# Patient Record
Sex: Female | Born: 1940 | Race: White | Hispanic: No | Marital: Married | Smoking: Never smoker
Health system: Southern US, Community
[De-identification: ages and names within clinical notes are randomized; demographics above are authoritative.]

## PROBLEM LIST (undated history)

## (undated) DIAGNOSIS — I10 Essential (primary) hypertension: Secondary | ICD-10-CM

## (undated) DIAGNOSIS — C801 Malignant (primary) neoplasm, unspecified: Secondary | ICD-10-CM

## (undated) DIAGNOSIS — Z8616 Personal history of COVID-19: Secondary | ICD-10-CM

## (undated) DIAGNOSIS — C4491 Basal cell carcinoma of skin, unspecified: Secondary | ICD-10-CM

## (undated) HISTORY — PX: BREAST CYST ASPIRATION: SHX578

## (undated) HISTORY — DX: Personal history of COVID-19: Z86.16

## (undated) HISTORY — DX: Basal cell carcinoma of skin, unspecified: C44.91

## (undated) HISTORY — PX: ABDOMINAL HYSTERECTOMY: SHX81

## (undated) HISTORY — PX: BREAST BIOPSY: SHX20

## (undated) HISTORY — DX: Essential (primary) hypertension: I10

---

## 2004-09-10 ENCOUNTER — Ambulatory Visit: Payer: Self-pay | Admitting: Obstetrics and Gynecology

## 2005-09-15 ENCOUNTER — Ambulatory Visit: Payer: Self-pay | Admitting: Obstetrics and Gynecology

## 2006-09-22 ENCOUNTER — Ambulatory Visit: Payer: Self-pay | Admitting: Obstetrics and Gynecology

## 2007-01-26 ENCOUNTER — Ambulatory Visit: Payer: Self-pay | Admitting: Gastroenterology

## 2007-09-28 ENCOUNTER — Ambulatory Visit: Payer: Self-pay | Admitting: Obstetrics and Gynecology

## 2007-10-02 ENCOUNTER — Ambulatory Visit: Payer: Self-pay | Admitting: Obstetrics and Gynecology

## 2008-10-01 ENCOUNTER — Ambulatory Visit: Payer: Self-pay | Admitting: Obstetrics and Gynecology

## 2008-10-08 ENCOUNTER — Ambulatory Visit: Payer: Self-pay | Admitting: Obstetrics and Gynecology

## 2009-04-21 ENCOUNTER — Ambulatory Visit: Payer: Self-pay | Admitting: Obstetrics and Gynecology

## 2009-10-02 ENCOUNTER — Ambulatory Visit: Payer: Self-pay | Admitting: Obstetrics and Gynecology

## 2010-06-29 ENCOUNTER — Ambulatory Visit: Payer: Self-pay | Admitting: Unknown Physician Specialty

## 2010-09-16 ENCOUNTER — Ambulatory Visit: Payer: Self-pay | Admitting: Unknown Physician Specialty

## 2010-11-03 ENCOUNTER — Ambulatory Visit: Payer: Self-pay | Admitting: Obstetrics and Gynecology

## 2011-03-11 ENCOUNTER — Ambulatory Visit: Payer: Self-pay | Admitting: Family Medicine

## 2011-12-16 ENCOUNTER — Ambulatory Visit: Payer: Self-pay | Admitting: Obstetrics and Gynecology

## 2012-07-05 ENCOUNTER — Ambulatory Visit: Payer: Self-pay | Admitting: Gastroenterology

## 2012-12-19 ENCOUNTER — Ambulatory Visit: Payer: Self-pay | Admitting: Obstetrics and Gynecology

## 2013-12-20 ENCOUNTER — Ambulatory Visit: Payer: Self-pay | Admitting: Obstetrics and Gynecology

## 2014-07-30 DIAGNOSIS — M797 Fibromyalgia: Secondary | ICD-10-CM | POA: Insufficient documentation

## 2014-07-30 DIAGNOSIS — M1990 Unspecified osteoarthritis, unspecified site: Secondary | ICD-10-CM | POA: Insufficient documentation

## 2014-07-30 DIAGNOSIS — I10 Essential (primary) hypertension: Secondary | ICD-10-CM | POA: Insufficient documentation

## 2014-07-30 DIAGNOSIS — G43909 Migraine, unspecified, not intractable, without status migrainosus: Secondary | ICD-10-CM | POA: Insufficient documentation

## 2014-07-30 DIAGNOSIS — K580 Irritable bowel syndrome with diarrhea: Secondary | ICD-10-CM | POA: Insufficient documentation

## 2014-07-30 DIAGNOSIS — M8580 Other specified disorders of bone density and structure, unspecified site: Secondary | ICD-10-CM | POA: Insufficient documentation

## 2014-12-09 ENCOUNTER — Ambulatory Visit: Payer: Self-pay | Admitting: Family Medicine

## 2014-12-24 ENCOUNTER — Ambulatory Visit: Payer: Self-pay | Admitting: Obstetrics and Gynecology

## 2015-08-18 DIAGNOSIS — E786 Lipoprotein deficiency: Secondary | ICD-10-CM | POA: Insufficient documentation

## 2015-10-16 IMAGING — MG MM DIGITAL SCREENING BILAT W/ CAD
1 series · 4 of 4 positions shown · non-contrast
Comparison: Previous exam(s).

CLINICAL DATA: Screening.

EXAM:
DIGITAL SCREENING BILATERAL MAMMOGRAM WITH CAD

[R CC · right · 4 of 4 slices shown]
[im 1/4]
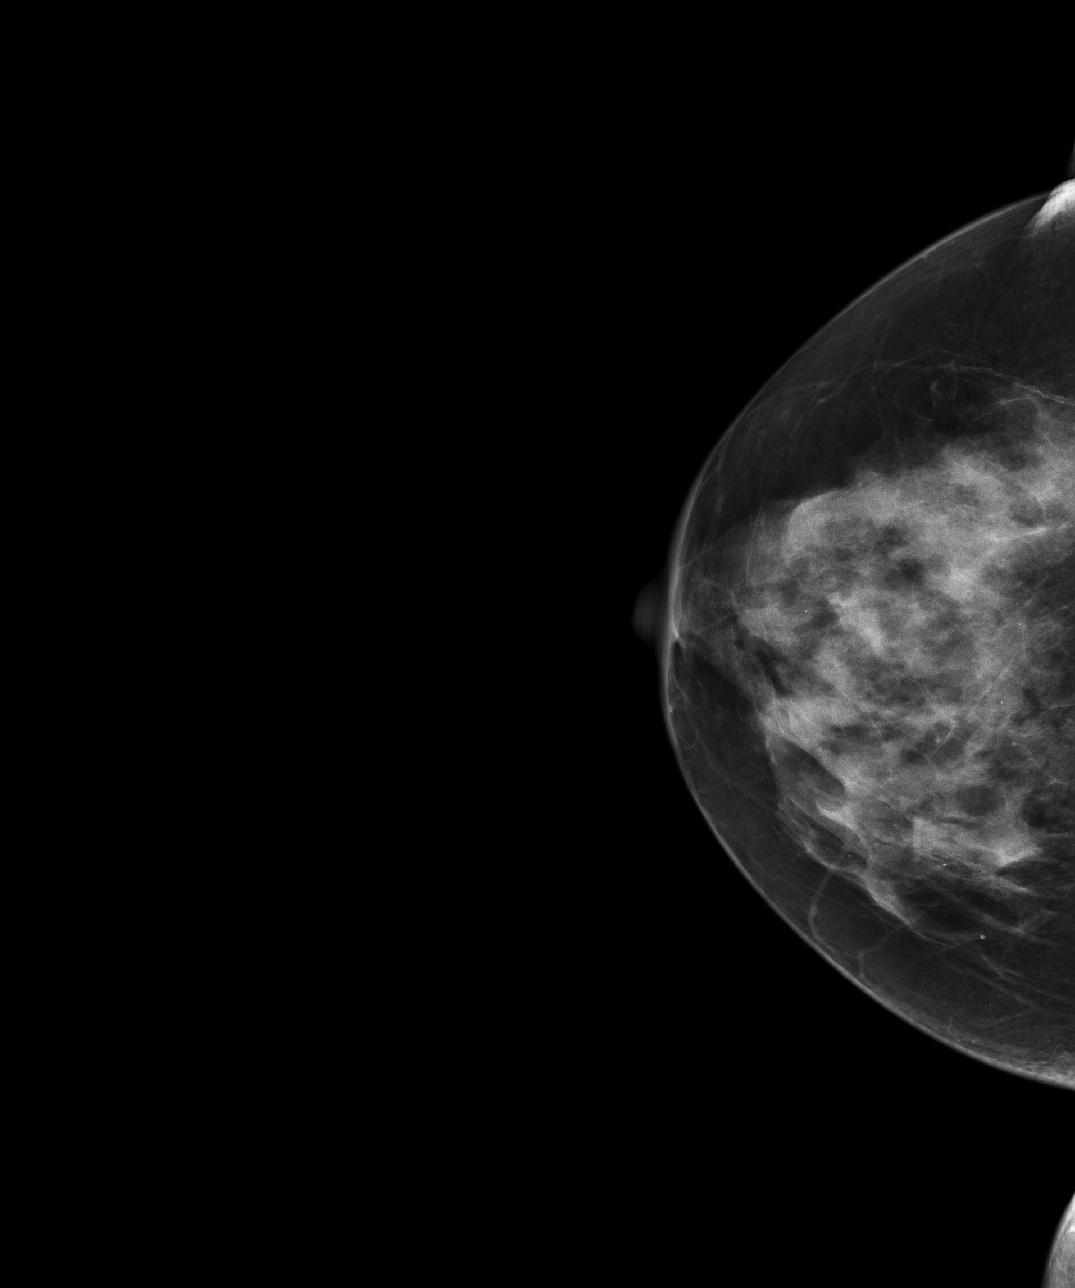
[im 2/4]
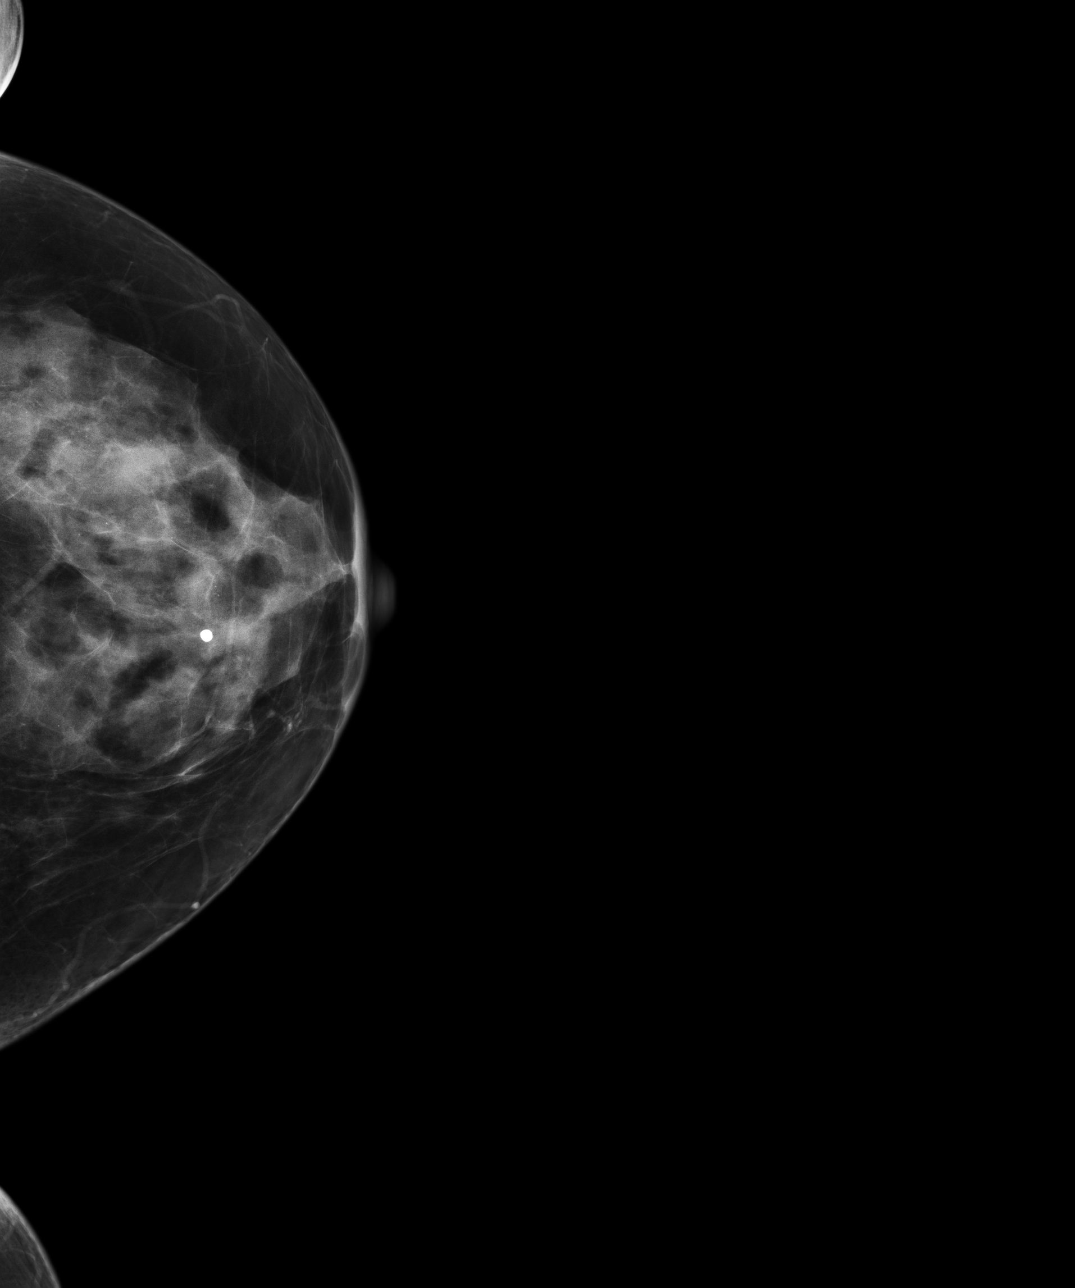
[im 3/4]
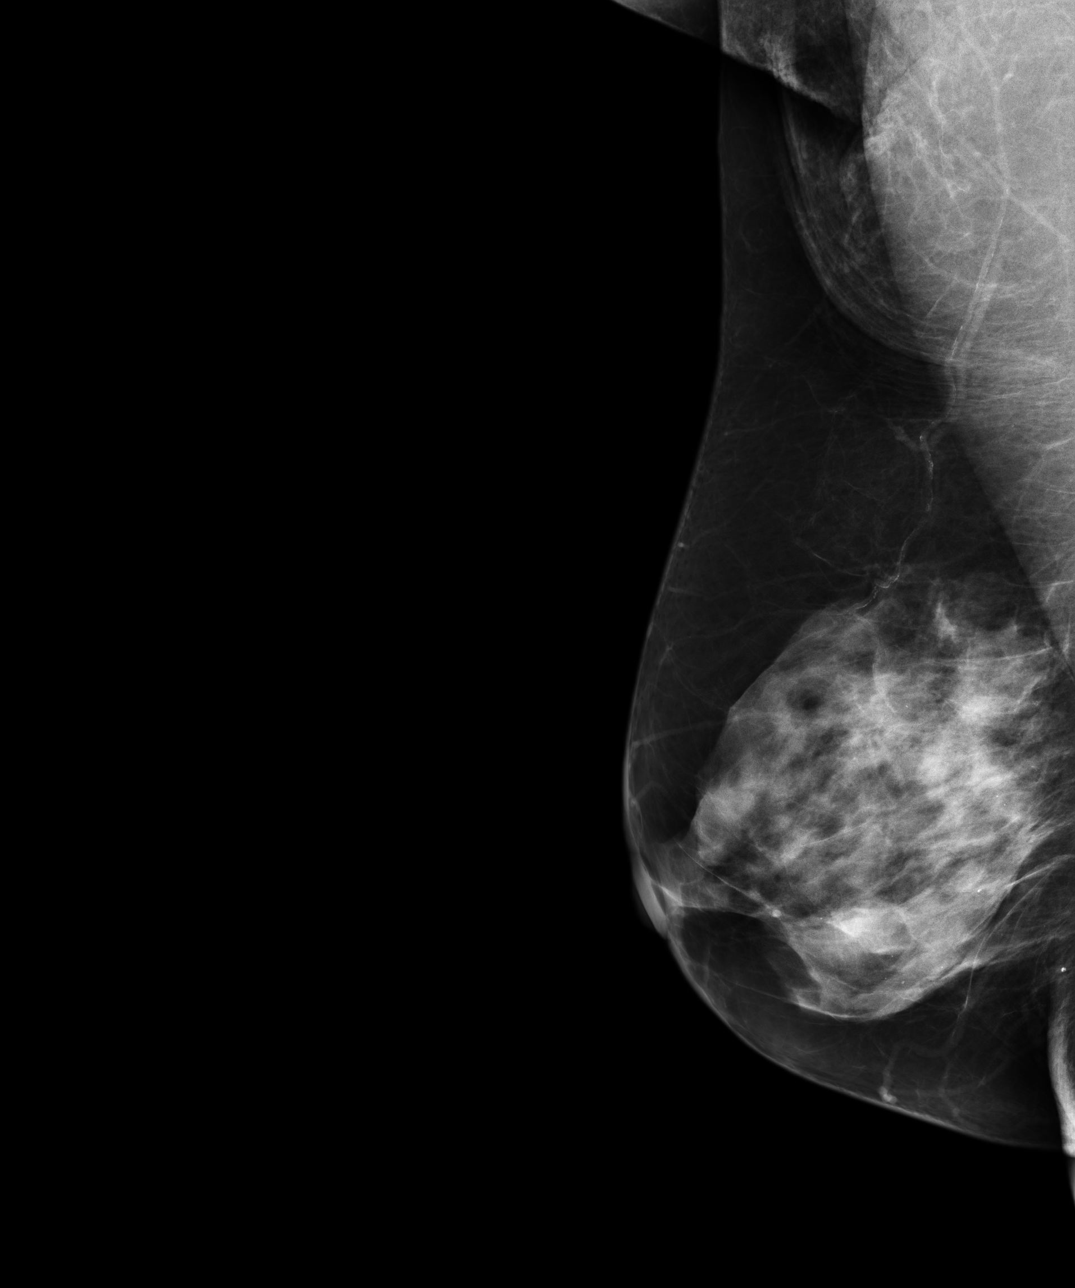
[im 4/4]
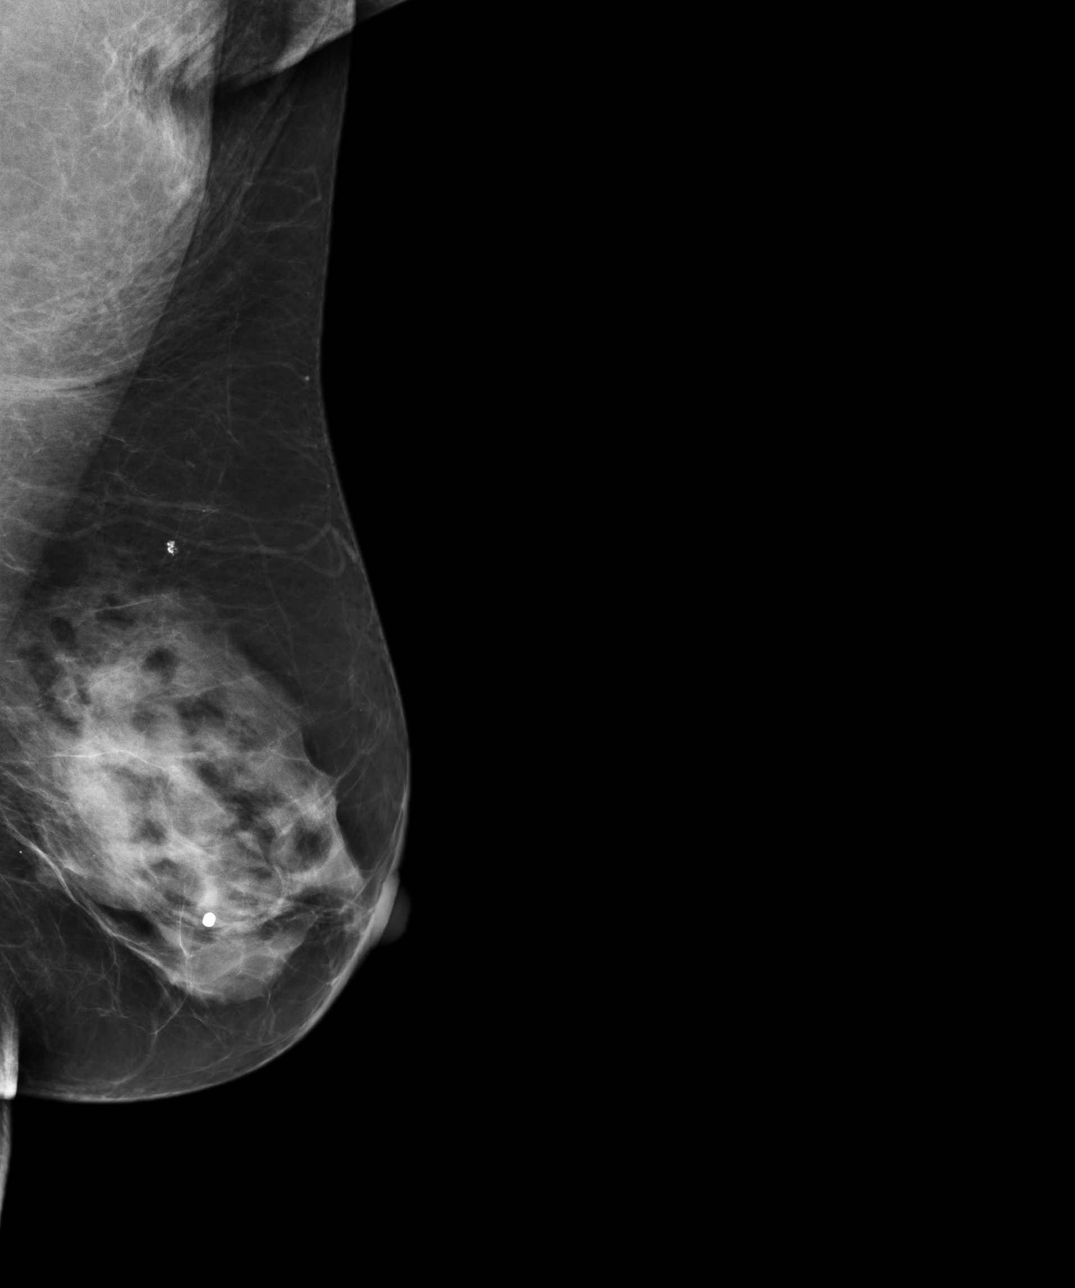

[4 of 4 positions shown; findings below may reference images not displayed]

ACR Breast Density Category c: The breast tissue is heterogeneously
dense, which may obscure small masses.
FINDINGS: There are no findings suspicious for malignancy. Images were
processed with CAD.
IMPRESSION: No mammographic evidence of malignancy. A result letter of this
screening mammogram will be mailed directly to the patient.

RECOMMENDATION:
Screening mammogram in one year. (Code:YJ-[id number])

BI-RADS CATEGORY  1: Negative.

## 2015-12-16 ENCOUNTER — Other Ambulatory Visit: Payer: Self-pay | Admitting: Obstetrics and Gynecology

## 2015-12-16 DIAGNOSIS — Z1231 Encounter for screening mammogram for malignant neoplasm of breast: Secondary | ICD-10-CM

## 2015-12-30 ENCOUNTER — Ambulatory Visit
Admission: RE | Admit: 2015-12-30 | Discharge: 2015-12-30 | Disposition: A | Discharge status: Home / Self Care | Payer: Medicare Other | Source: Ambulatory Visit | Attending: Obstetrics and Gynecology | Admitting: Obstetrics and Gynecology

## 2015-12-30 DIAGNOSIS — Z1231 Encounter for screening mammogram for malignant neoplasm of breast: Secondary | ICD-10-CM | POA: Diagnosis not present

## 2016-02-17 DIAGNOSIS — A419 Sepsis, unspecified organism: Secondary | ICD-10-CM | POA: Insufficient documentation

## 2016-02-19 DIAGNOSIS — N10 Acute tubulo-interstitial nephritis: Secondary | ICD-10-CM | POA: Insufficient documentation

## 2016-11-26 ENCOUNTER — Other Ambulatory Visit: Payer: Self-pay | Admitting: Obstetrics and Gynecology

## 2016-11-26 DIAGNOSIS — Z1231 Encounter for screening mammogram for malignant neoplasm of breast: Secondary | ICD-10-CM

## 2016-12-30 ENCOUNTER — Ambulatory Visit
Admission: RE | Admit: 2016-12-30 | Discharge: 2016-12-30 | Disposition: A | Discharge status: Home / Self Care | Payer: Medicare Other | Source: Ambulatory Visit | Attending: Obstetrics and Gynecology | Admitting: Obstetrics and Gynecology

## 2016-12-30 DIAGNOSIS — Z1231 Encounter for screening mammogram for malignant neoplasm of breast: Secondary | ICD-10-CM | POA: Diagnosis not present

## 2016-12-30 HISTORY — DX: Malignant (primary) neoplasm, unspecified: C80.1

## 2017-01-12 ENCOUNTER — Encounter: Payer: Self-pay | Admitting: Urology

## 2017-01-12 ENCOUNTER — Ambulatory Visit: Payer: Medicare Other | Admitting: Urology

## 2017-01-12 VITALS — BP 145/73 | HR 64 | Ht 64.0 in | Wt 148.1 lb

## 2017-01-12 DIAGNOSIS — N390 Urinary tract infection, site not specified: Secondary | ICD-10-CM | POA: Diagnosis not present

## 2017-01-12 DIAGNOSIS — N3020 Other chronic cystitis without hematuria: Secondary | ICD-10-CM

## 2017-01-12 DIAGNOSIS — R319 Hematuria, unspecified: Secondary | ICD-10-CM | POA: Diagnosis not present

## 2017-01-12 LAB — URINALYSIS, COMPLETE
BILIRUBIN UA: NEGATIVE
GLUCOSE, UA: NEGATIVE
KETONES UA: NEGATIVE
NITRITE UA: NEGATIVE
Protein, UA: NEGATIVE
RBC UA: NEGATIVE
SPEC GRAV UA: 1.015 (ref 1.005–1.030)
Urobilinogen, Ur: 0.2 mg/dL (ref 0.2–1.0)
pH, UA: 6 (ref 5.0–7.5)

## 2017-01-12 LAB — MICROSCOPIC EXAMINATION: Bacteria, UA: NONE SEEN

## 2017-01-12 LAB — BLADDER SCAN AMB NON-IMAGING: Scan Result: 8

## 2017-01-12 MED ORDER — TRIMETHOPRIM 100 MG PO TABS: 100.0000 mg | ORAL_TABLET | Freq: Every day | ORAL | 11 refills | Status: DC

## 2017-01-12 NOTE — Progress Notes (Signed)
01/12/2017 11:01 AM   Anne Ross 1941/02/10 [id number]  Referring provider: Sherrin Daisy, MD [place], [place] [id number]  Chief Complaint  Patient presents with  . New Patient (Initial Visit)    recurrent UTI w/ hematuria     HPI: I was consulted to assess the patient who had 2 urinary tract infections in the last year. Last March she was admitted to the hospital in [facility] with a temperature of 104.7 with right low back pain and foul-smelling urine. He was the intensive care unit. During the second bladder infection she was treated as an outpatient the did have a low grade temperature  At baseline she voids every 2-3 hours and is continent and has no nocturia. She does not normally get urinary tract infections. She has had a hysterectomy but not previous GU surgery or kidney stones.Modifying factors:   Associated signs and symptoms: There are no other associated signs and symptoms Aggravating and relieving factors: There are no other aggravating or relieving factors Severity: Moderate Duration: Persistent        PMH: Past Medical History:  Diagnosis Date  . Cancer ([place])    uterine  . Hypertension     Surgical History: Past Surgical History:  Procedure Laterality Date  . ABDOMINAL HYSTERECTOMY    . BREAST BIOPSY Left    benign 2 times  . BREAST BIOPSY Right    neg  . BREAST CYST ASPIRATION Left    neg    Home Medications:  Allergies as of 01/12/2017      Reactions   Cortisone Hives   Other Hives      Medication List       Accurate as of 01/12/17 11:01 AM. Always use your most recent med list.          CALCIUM 500 + D 500-125 MG-UNIT Tabs Generic drug:  Calcium Carbonate-Vitamin D Take by mouth.   cetirizine 10 MG tablet Commonly known as:  ZYRTEC Take 10 mg by mouth daily.   FIBER CHOICE PO Take by mouth.   fluticasone 50 MCG/ACT nasal spray Commonly known as:  FLONASE Place into both nostrils daily.   folic  acid [id number] MCG tablet Commonly known as:  FOLVITE Take 400 mcg by mouth daily.   metoprolol 50 MG tablet Commonly known as:  LOPRESSOR Take 50 mg by mouth 2 (two) times daily.   pyridoxine 100 MG tablet Commonly known as:  B-6 Take 100 mg by mouth daily.   RAMIPRIL PO Take by mouth.   vitamin B-12 250 MCG tablet Commonly known as:  CYANOCOBALAMIN Take 250 mcg by mouth daily.   Vitamin D 2000 units tablet Take 2,000 Units by mouth daily.       Allergies:  Allergies  Allergen Reactions  . Cortisone Hives  . Other Hives    Family History: Family History  Problem Relation Age of Onset  . Prostate cancer Maternal Grandfather   . Breast cancer Neg Hx     Social History:  has no tobacco, alcohol, and drug history on file.  ROS: UROLOGY Frequent Urination?: No Hard to postpone urination?: No Burning/pain with urination?: No Get up at night to urinate?: No Leakage of urine?: No Urine stream starts and stops?: Yes Trouble starting stream?: No Do you have to strain to urinate?: No Blood in urine?: No Urinary tract infection?: No Sexually transmitted disease?: No Injury to kidneys or bladder?: No Painful intercourse?: No Weak stream?: No Currently pregnant?: No Vaginal bleeding?:  No Last menstrual period?: n                                      Physical Exam: BP (!) 145/73   Pulse 64   Ht 5\' 4"  (1.626 m)   Wt 67.2 kg (148 lb 1.6 oz)   BMI 25.42 kg/m   Constitutional:  Alert and oriented, No acute distress. HEENT: [place] AT, moist mucus membranes.  Trachea midline, no masses. Cardiovascular: No clubbing, cyanosis, or edema. Respiratory: Normal respiratory effort, no increased work of breathing. GI: Abdomen is soft, nontender, nondistended, no abdominal masses GU: Wild hypermobility of the bladder neck and no stress incontinence; grade 1 cystocele Skin: No rashes, bruises or suspicious lesions. Lymph: No cervical or inguinal  adenopathy. Neurologic: Grossly intact, no focal deficits, moving all 4 extremities. Psychiatric: Normal mood and affect.  Laboratory Data:  Urinalysis No results found for: COLORURINE, APPEARANCEUR, LABSPEC, PHURINE, GLUCOSEU, HGBUR, BILIRUBINUR, KETONESUR, PROTEINUR, UROBILINOGEN, NITRITE, LEUKOCYTESUR  Pertinent Imaging: No new xray  Assessment & Plan:  The patient describes right lower quadrant or right low back pain and fever with urinary tract infections. The wire she has minimal voiding dysfunction. On review of the medical records she had a normal CT scan of the kidneys in March 2017  The pathophysiology of urinary tract infections was discussed. The pathophysiology of pyelonephritis and the role of prophylaxis was discussed.  Based upon the frequency of the events I can understand why the patient would not take daily prophylaxis but because of their severity she chose trimethoprim 100 mg with 30 tablets of 11 refills  I will reassess the patient in 2 months  1. Urinary tract infection with hematuria, site unspecified 2. Urinary frequency    - Urinalysis, Complete - Bladder Scan (Post Void Residual) in office   No Follow-up on file.  Reece Packer, MD  [facility] [address] [place], [place] [address] [phone]

## 2017-01-21 ENCOUNTER — Ambulatory Visit: Payer: Self-pay | Admitting: Urology

## 2017-03-14 ENCOUNTER — Ambulatory Visit: Payer: Medicare Other | Admitting: Urology

## 2017-03-14 VITALS — BP 151/67 | HR 73 | Ht 63.0 in | Wt 145.8 lb

## 2017-03-14 DIAGNOSIS — N3000 Acute cystitis without hematuria: Secondary | ICD-10-CM | POA: Diagnosis not present

## 2017-03-14 LAB — URINALYSIS, COMPLETE
BILIRUBIN UA: NEGATIVE
Glucose, UA: NEGATIVE
KETONES UA: NEGATIVE
LEUKOCYTES UA: NEGATIVE
NITRITE UA: NEGATIVE
Protein, UA: NEGATIVE
RBC UA: NEGATIVE
UUROB: 0.2 mg/dL (ref 0.2–1.0)
pH, UA: 6 (ref 5.0–7.5)

## 2017-03-14 NOTE — Progress Notes (Signed)
03/14/2017 9:32 AM   Anne Ross 23-Jan-1941 [id number]  Referring provider: Sofie Hartigan, MD [place], [place] [address]  Chief Complaint  Patient presents with  . Follow-up    recurrent UTI    HPI: I was consulted to assess the patient who had 2 urinary tract infections in the last year. Last March she was admitted to the hospital in [facility] with a temperature of 104.7 with right low back pain and foul-smelling urine. He was the intensive care unit. During the second bladder infection she was treated as an outpatient the did have a low grade temperature  At baseline she voids every 2-3 hours and is continent and has no nocturia. She does not normally get urinary tract infections.   The patient describes right lower quadrant or right low back pain and fever with urinary tract infections. She has minimal voiding dysfunction. On review of the medical records she had a normal CT scan of the kidneys in March 2017  The pathophysiology of urinary tract infections was discussed. The pathophysiology of pyelonephritis and the role of prophylaxis was discussed.  Based upon the frequency of the events I can understand why the patient would not take daily prophylaxis but because of their severity she chose trimethoprim 100 mg with 30 tablets of 11 refills  Today Frequency is stable The patient is clinically noninfected. She is very pleased and wishes to stay on prophylaxis  PMH: Past Medical History:  Diagnosis Date  . Cancer ([place])    uterine  . Hypertension     Surgical History: Past Surgical History:  Procedure Laterality Date  . ABDOMINAL HYSTERECTOMY    . BREAST BIOPSY Left    benign 2 times  . BREAST BIOPSY Right    neg  . BREAST CYST ASPIRATION Left    neg    Home Medications:  Allergies as of 03/14/2017      Reactions   Cortisone Hives   Other Hives      Medication List       Accurate as of 03/14/17  9:32 AM. Always use your most  recent med list.          CALCIUM 500 + D 500-125 MG-UNIT Tabs Generic drug:  Calcium Carbonate-Vitamin D Take by mouth.   cetirizine 10 MG tablet Commonly known as:  ZYRTEC Take 10 mg by mouth daily.   FIBER CHOICE PO Take by mouth.   fluticasone 50 MCG/ACT nasal spray Commonly known as:  FLONASE Place into both nostrils daily.   folic acid 127 MCG tablet Commonly known as:  FOLVITE Take 400 mcg by mouth daily.   metoprolol 50 MG tablet Commonly known as:  LOPRESSOR Take 50 mg by mouth 2 (two) times daily.   pyridoxine 100 MG tablet Commonly known as:  B-6 Take 100 mg by mouth daily.   RAMIPRIL PO Take by mouth.   trimethoprim 100 MG tablet Commonly known as:  TRIMPEX Take 1 tablet (100 mg total) by mouth daily.   vitamin B-12 250 MCG tablet Commonly known as:  CYANOCOBALAMIN Take 250 mcg by mouth daily.   Vitamin D 2000 units tablet Take 2,000 Units by mouth daily.       Allergies:  Allergies  Allergen Reactions  . Cortisone Hives  . Other Hives    Family History: Family History  Problem Relation Age of Onset  . Prostate cancer Maternal Grandfather   . Breast cancer Neg Hx     Social History:  has no tobacco, alcohol, and drug history on file.  ROS: UROLOGY Frequent Urination?: No Hard to postpone urination?: No Burning/pain with urination?: No Get up at night to urinate?: No Leakage of urine?: No Urine stream starts and stops?: No Trouble starting stream?: No Do you have to strain to urinate?: No Blood in urine?: No Urinary tract infection?: No Sexually transmitted disease?: No Injury to kidneys or bladder?: No Painful intercourse?: No Weak stream?: No Currently pregnant?: No Vaginal bleeding?: No Last menstrual period?: n  Gastrointestinal Nausea?: No Vomiting?: No Indigestion/heartburn?: No Diarrhea?: No Constipation?: No  Constitutional Fever: No Night sweats?: No Fatigue?: No  Skin Skin rash/lesions?:  No Itching?: No  Eyes Blurred vision?: No Double vision?: No  Ears/Nose/Throat Sore throat?: Yes Sinus problems?: Yes  Hematologic/Lymphatic Swollen glands?: No Easy bruising?: No  Cardiovascular Leg swelling?: No Chest pain?: No  Respiratory Cough?: No Shortness of breath?: No  Endocrine Excessive thirst?: No  Musculoskeletal Back pain?: No Joint pain?: No  Neurological Headaches?: No Dizziness?: No  Psychologic Depression?: No Anxiety?: No  Physical Exam: BP (!) 151/67   Pulse 73   Ht 5\' 3"  (1.6 m)   Wt 145 lb 12.8 oz (66.1 kg)   BMI 25.83 kg/m   Constitutional:  Alert and oriented, No acute distress.   Laboratory Data:  Urinalysis    Component Value Date/Time   APPEARANCEUR Cloudy (A) 01/12/2017 1020   GLUCOSEU Negative 01/12/2017 1020   BILIRUBINUR Negative 01/12/2017 1020   PROTEINUR Negative 01/12/2017 1020   NITRITE Negative 01/12/2017 1020   LEUKOCYTESUR Trace (A) 01/12/2017 1020    Pertinent Imaging:   Assessment & Plan:  Reassess patient in 9 months on daily prophylaxis.  1. Acute cystitis without hematuria  - Urinalysis, Complete   Return in about 9 months (around 12/14/2017).  Reece Packer, MD  [facility] [address] [place], [place] [address] [phone]

## 2017-06-28 ENCOUNTER — Other Ambulatory Visit: Payer: Self-pay | Admitting: Family Medicine

## 2017-06-28 DIAGNOSIS — R2233 Localized swelling, mass and lump, upper limb, bilateral: Secondary | ICD-10-CM

## 2017-07-05 ENCOUNTER — Ambulatory Visit
Admission: RE | Admit: 2017-07-05 | Discharge: 2017-07-05 | Disposition: A | Discharge status: Home / Self Care | Payer: Medicare Other | Source: Ambulatory Visit | Attending: Family Medicine | Admitting: Family Medicine

## 2017-07-05 DIAGNOSIS — I10 Essential (primary) hypertension: Secondary | ICD-10-CM | POA: Insufficient documentation

## 2017-07-05 DIAGNOSIS — R590 Localized enlarged lymph nodes: Secondary | ICD-10-CM | POA: Diagnosis not present

## 2017-07-05 DIAGNOSIS — R2233 Localized swelling, mass and lump, upper limb, bilateral: Secondary | ICD-10-CM

## 2017-08-25 DIAGNOSIS — Z86010 Personal history of colonic polyps: Secondary | ICD-10-CM | POA: Insufficient documentation

## 2017-08-25 DIAGNOSIS — K591 Functional diarrhea: Secondary | ICD-10-CM | POA: Insufficient documentation

## 2017-12-12 ENCOUNTER — Encounter: Payer: Self-pay | Admitting: Urology

## 2017-12-12 ENCOUNTER — Ambulatory Visit: Payer: Medicare Other | Admitting: Urology

## 2017-12-12 VITALS — BP 148/69 | HR 65 | Ht 63.0 in | Wt 150.1 lb

## 2017-12-12 DIAGNOSIS — N3020 Other chronic cystitis without hematuria: Secondary | ICD-10-CM

## 2017-12-12 MED ORDER — TRIMETHOPRIM 100 MG PO TABS: 100.0000 mg | ORAL_TABLET | Freq: Every day | ORAL | 3 refills | Status: DC

## 2017-12-12 NOTE — Progress Notes (Signed)
12/12/2017 10:08 AM   Crissie Figures Bialas Apr 12, 1941 [id number]  Referring provider: Sofie Hartigan, MD [place], [place] [address]  Chief Complaint  Patient presents with  . Follow-up    Cystitis    HPI: I reviewed my dictation from April 2018.  She has recurrent urinary tract infections.  She has had fever and right lower quadrant and back pain in the pass.  Clinically infection free.  She is very pleased.  Sometimes she has a vague low right back pain and right lower quadrant or inguinal pain and she agrees it may not be related   PMH: Past Medical History:  Diagnosis Date  . Cancer ([place])    uterine  . Hypertension     Surgical History: Past Surgical History:  Procedure Laterality Date  . ABDOMINAL HYSTERECTOMY    . BREAST BIOPSY Left    benign 2 times  . BREAST BIOPSY Right    neg  . BREAST CYST ASPIRATION Left    neg    Home Medications:  Allergies as of 12/12/2017      Reactions   Cortisone Hives   Other Hives      Medication List        Accurate as of 12/12/17 10:08 AM. Always use your most recent med list.          CALCIUM 500 + D 500-125 MG-UNIT Tabs Generic drug:  Calcium Carbonate-Vitamin D Take by mouth.   cetirizine 10 MG tablet Commonly known as:  ZYRTEC Take 10 mg by mouth daily.   FIBER CHOICE PO Take by mouth.   fluticasone 50 MCG/ACT nasal spray Commonly known as:  FLONASE Place into both nostrils daily.   folic acid 191 MCG tablet Commonly known as:  FOLVITE Take 400 mcg by mouth daily.   metoprolol tartrate 50 MG tablet Commonly known as:  LOPRESSOR Take 50 mg by mouth 2 (two) times daily.   pyridoxine 100 MG tablet Commonly known as:  B-6 Take 100 mg by mouth daily.   RAMIPRIL PO Take by mouth.   trimethoprim 100 MG tablet Commonly known as:  TRIMPEX Take 1 tablet (100 mg total) by mouth daily.   vitamin B-12 250 MCG tablet Commonly known as:  CYANOCOBALAMIN Take 250 mcg by mouth daily.     Vitamin D 2000 units tablet Take 2,000 Units by mouth daily.       Allergies:  Allergies  Allergen Reactions  . Cortisone Hives  . Other Hives    Family History: Family History  Problem Relation Age of Onset  . Prostate cancer Maternal Grandfather   . Breast cancer Neg Hx     Social History:  reports that  has never smoked. she has never used smokeless tobacco. She reports that she does not drink alcohol or use drugs.  ROS: UROLOGY Frequent Urination?: No Hard to postpone urination?: No Burning/pain with urination?: No Get up at night to urinate?: No Leakage of urine?: No Urine stream starts and stops?: No Trouble starting stream?: No Do you have to strain to urinate?: No Blood in urine?: No Urinary tract infection?: No Sexually transmitted disease?: No Injury to kidneys or bladder?: No Painful intercourse?: No Weak stream?: No Currently pregnant?: No Vaginal bleeding?: No Last menstrual period?: n  Gastrointestinal Nausea?: No Vomiting?: No Indigestion/heartburn?: No Diarrhea?: No Constipation?: No  Constitutional Fever: No Night sweats?: No Weight loss?: No Fatigue?: No  Skin Skin rash/lesions?: No Itching?: No  Eyes Blurred vision?: No Double  vision?: No  Ears/Nose/Throat Sore throat?: No Sinus problems?: No  Hematologic/Lymphatic Swollen glands?: No Easy bruising?: No  Cardiovascular Leg swelling?: No Chest pain?: No  Respiratory Cough?: No Shortness of breath?: No  Endocrine Excessive thirst?: No  Musculoskeletal Back pain?: No Joint pain?: No  Neurological Headaches?: No Dizziness?: No  Psychologic Depression?: No Anxiety?: No  Physical Exam: BP (!) 148/69 (BP Location: Right Arm, Patient Position: Sitting, Cuff Size: Normal)   Pulse 65   Ht 5\' 3"  (1.6 m)   Wt 150 lb 1.6 oz (68.1 kg)   BMI 26.59 kg/m   Constitutional:  Alert and oriented, No acute distress.   Laboratory Data: No results found for: WBC,  HGB, HCT, MCV, PLT  No results found for: CREATININE  No results found for: PSA  No results found for: TESTOSTERONE  No results found for: HGBA1C  Urinalysis    Component Value Date/Time   APPEARANCEUR Clear 03/14/2017 0859   GLUCOSEU Negative 03/14/2017 0859   BILIRUBINUR Negative 03/14/2017 0859   PROTEINUR Negative 03/14/2017 0859   NITRITE Negative 03/14/2017 0859   LEUKOCYTESUR Negative 03/14/2017 0859    Pertinent Imaging: None  Assessment & Plan: 90 x 3 trimethoprim prescribed and I will see her in 1 year  There are no diagnoses linked to this encounter.  No Follow-up on file.  Reece Packer, MD  [facility] [address] [place], [place] [address] [phone]

## 2018-01-05 ENCOUNTER — Other Ambulatory Visit: Payer: Self-pay | Admitting: Obstetrics and Gynecology

## 2018-01-05 DIAGNOSIS — Z1231 Encounter for screening mammogram for malignant neoplasm of breast: Secondary | ICD-10-CM

## 2018-01-17 ENCOUNTER — Ambulatory Visit
Admission: RE | Admit: 2018-01-17 | Discharge: 2018-01-17 | Disposition: A | Discharge status: Home / Self Care | Payer: Medicare Other | Source: Ambulatory Visit | Attending: Obstetrics and Gynecology | Admitting: Obstetrics and Gynecology

## 2018-01-17 ENCOUNTER — Encounter (INDEPENDENT_AMBULATORY_CARE_PROVIDER_SITE_OTHER): Payer: Self-pay

## 2018-01-17 DIAGNOSIS — Z1231 Encounter for screening mammogram for malignant neoplasm of breast: Secondary | ICD-10-CM

## 2018-12-11 ENCOUNTER — Ambulatory Visit: Payer: Medicare Other | Admitting: Urology

## 2018-12-11 ENCOUNTER — Encounter: Payer: Self-pay | Admitting: Urology

## 2018-12-11 VITALS — BP 147/67 | HR 76 | Ht 63.0 in | Wt 148.0 lb

## 2018-12-11 DIAGNOSIS — N390 Urinary tract infection, site not specified: Secondary | ICD-10-CM

## 2018-12-11 LAB — URINALYSIS, COMPLETE
Bilirubin, UA: NEGATIVE
Glucose, UA: NEGATIVE
Ketones, UA: NEGATIVE
NITRITE UA: NEGATIVE
Protein, UA: NEGATIVE
RBC, UA: NEGATIVE
SPEC GRAV UA: 1.025 (ref 1.005–1.030)
Urobilinogen, Ur: 0.2 mg/dL (ref 0.2–1.0)
pH, UA: 6 (ref 5.0–7.5)

## 2018-12-11 LAB — MICROSCOPIC EXAMINATION: RBC, UA: NONE SEEN /hpf (ref 0–2)

## 2018-12-11 MED ORDER — TRIMETHOPRIM 100 MG PO TABS: 100.0000 mg | ORAL_TABLET | Freq: Every day | ORAL | 3 refills | Status: DC

## 2018-12-11 NOTE — Progress Notes (Signed)
12/11/2018 10:07 AM   Anne Ross June 05, 1941 [id number]  Referring provider: Sofie Hartigan, MD [place], [place] [address]  Chief Complaint  Patient presents with  . Follow-up    1year    HPI: 78 year old female presents for annual follow-up of recurrent UTI.  She has had 2 prior UTIs which presented as severe sepsis requiring hospitalization.  Symptoms were acute in onset.  She has previously seen Dr. Matilde Sprang and has been on low-dose trimethoprim suppression for the past 2 years.  She states this is worked well.  She has no side effects.  She is reluctant to discontinue antibiotic suppression.  She has had no recurrent infection since being on this regimen.   PMH: Past Medical History:  Diagnosis Date  . Cancer ([place])    uterine  . Hypertension     Surgical History: Past Surgical History:  Procedure Laterality Date  . ABDOMINAL HYSTERECTOMY    . BREAST BIOPSY Left    benign 2 times  . BREAST BIOPSY Right    neg  . BREAST CYST ASPIRATION Left    neg    Home Medications:  Allergies as of 12/11/2018      Reactions   Cortisone Hives   Other Hives      Medication List       Accurate as of December 11, 2018 10:07 AM. Always use your most recent med list.        aspirin EC 81 MG tablet Take by mouth.   CALCIUM 500 + D 500-125 MG-UNIT Tabs Generic drug:  Calcium Carbonate-Vitamin D Take by mouth.   cetirizine 10 MG tablet Commonly known as:  ZYRTEC Take 10 mg by mouth daily.   fluticasone 50 MCG/ACT nasal spray Commonly known as:  FLONASE Place into both nostrils daily.   folic acid 353 MCG tablet Commonly known as:  FOLVITE Take 400 mcg by mouth daily.   metoprolol tartrate 50 MG tablet Commonly known as:  LOPRESSOR Take 50 mg by mouth 2 (two) times daily.   pyridoxine 100 MG tablet Commonly known as:  B-6 Take 100 mg by mouth daily.   RAMIPRIL PO Take by mouth.   trimethoprim 100 MG tablet Commonly known as:   TRIMPEX Take 1 tablet (100 mg total) by mouth daily.   vitamin B-12 250 MCG tablet Commonly known as:  CYANOCOBALAMIN Take 250 mcg by mouth daily.   Vitamin D 50 MCG (2000 UT) tablet Take 2,000 Units by mouth daily.       Allergies:  Allergies  Allergen Reactions  . Cortisone Hives  . Other Hives    Family History: Family History  Problem Relation Age of Onset  . Prostate cancer Maternal Grandfather   . Breast cancer Neg Hx     Social History:  reports that she has never smoked. She has never used smokeless tobacco. She reports that she does not drink alcohol or use drugs.  ROS: UROLOGY Frequent Urination?: No Hard to postpone urination?: No Burning/pain with urination?: No Get up at night to urinate?: No Leakage of urine?: No Urine stream starts and stops?: No Trouble starting stream?: No Do you have to strain to urinate?: No Blood in urine?: No Urinary tract infection?: No Sexually transmitted disease?: No Injury to kidneys or bladder?: No Painful intercourse?: No Weak stream?: No Currently pregnant?: No Vaginal bleeding?: No Last menstrual period?: n  Gastrointestinal Nausea?: No Vomiting?: No Indigestion/heartburn?: No Diarrhea?: Yes Constipation?: No  Constitutional Fever: No Night  sweats?: No Weight loss?: No Fatigue?: No  Skin Skin rash/lesions?: No Itching?: No  Eyes Blurred vision?: No Double vision?: No  Ears/Nose/Throat Sore throat?: No Sinus problems?: Yes  Hematologic/Lymphatic Swollen glands?: Yes Easy bruising?: No  Cardiovascular Leg swelling?: No Chest pain?: No  Respiratory Cough?: No Shortness of breath?: No  Endocrine Excessive thirst?: No  Musculoskeletal Back pain?: No Joint pain?: No  Neurological Headaches?: No Dizziness?: No  Psychologic Depression?: No Anxiety?: No  Physical Exam: BP (!) 147/67   Pulse 76   Ht 5\' 3"  (1.6 m)   Wt 148 lb (67.1 kg)   BMI 26.22 kg/m   Constitutional:   Alert and oriented, No acute distress. HEENT: [place] AT, moist mucus membranes.  Trachea midline, no masses. Cardiovascular: No clubbing, cyanosis, or edema. Respiratory: Normal respiratory effort, no increased work of breathing. GI: Abdomen is soft, nontender, nondistended, no abdominal masses GU: No CVA tenderness Lymph: No cervical or inguinal lymphadenopathy. Skin: No rashes, bruises or suspicious lesions. Neurologic: Grossly intact, no focal deficits, moving all 4 extremities. Psychiatric: Normal mood and affect.  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  She desires to continue low-dose antibiotic suppression and trimethoprim was refilled.  Continue annual follow-up.    Return in about 1 year (around 12/12/2019) for [place].  Abbie Sons, [place] [address], [place], [place] [address] [phone]

## 2018-12-14 LAB — CULTURE, URINE COMPREHENSIVE

## 2019-02-07 ENCOUNTER — Other Ambulatory Visit: Payer: Self-pay | Admitting: Obstetrics and Gynecology

## 2019-02-07 DIAGNOSIS — Z1231 Encounter for screening mammogram for malignant neoplasm of breast: Secondary | ICD-10-CM

## 2019-02-19 ENCOUNTER — Ambulatory Visit: Payer: Medicare Other

## 2019-04-04 ENCOUNTER — Ambulatory Visit: Payer: Medicare Other

## 2019-05-31 ENCOUNTER — Ambulatory Visit
Admission: RE | Admit: 2019-05-31 | Discharge: 2019-05-31 | Disposition: A | Discharge status: Home / Self Care | Payer: Medicare Other | Source: Ambulatory Visit | Attending: Obstetrics and Gynecology | Admitting: Obstetrics and Gynecology

## 2019-05-31 ENCOUNTER — Encounter (INDEPENDENT_AMBULATORY_CARE_PROVIDER_SITE_OTHER): Payer: Self-pay

## 2019-05-31 ENCOUNTER — Other Ambulatory Visit: Payer: Self-pay

## 2019-05-31 DIAGNOSIS — Z1231 Encounter for screening mammogram for malignant neoplasm of breast: Secondary | ICD-10-CM | POA: Diagnosis not present

## 2019-07-16 ENCOUNTER — Other Ambulatory Visit: Payer: Self-pay | Admitting: Family Medicine

## 2019-07-16 DIAGNOSIS — R2232 Localized swelling, mass and lump, left upper limb: Secondary | ICD-10-CM

## 2019-07-17 ENCOUNTER — Ambulatory Visit
Admission: RE | Admit: 2019-07-17 | Discharge: 2019-07-17 | Disposition: A | Discharge status: Home / Self Care | Payer: Medicare Other | Source: Ambulatory Visit | Attending: Family Medicine | Admitting: Family Medicine

## 2019-07-17 ENCOUNTER — Other Ambulatory Visit: Payer: Self-pay

## 2019-07-17 ENCOUNTER — Encounter (INDEPENDENT_AMBULATORY_CARE_PROVIDER_SITE_OTHER): Payer: Self-pay

## 2019-07-17 DIAGNOSIS — R2232 Localized swelling, mass and lump, left upper limb: Secondary | ICD-10-CM | POA: Insufficient documentation

## 2019-09-03 DIAGNOSIS — M10072 Idiopathic gout, left ankle and foot: Secondary | ICD-10-CM | POA: Insufficient documentation

## 2019-12-05 ENCOUNTER — Encounter: Payer: Self-pay | Admitting: Urology

## 2019-12-05 ENCOUNTER — Ambulatory Visit: Payer: Medicare PPO | Admitting: Urology

## 2019-12-05 ENCOUNTER — Other Ambulatory Visit: Payer: Self-pay

## 2019-12-05 VITALS — BP 135/71 | HR 76 | Ht 63.0 in | Wt 147.7 lb

## 2019-12-05 DIAGNOSIS — N390 Urinary tract infection, site not specified: Secondary | ICD-10-CM

## 2019-12-05 MED ORDER — TRIMETHOPRIM 100 MG PO TABS: 100.0000 mg | ORAL_TABLET | Freq: Every day | ORAL | 3 refills | Status: DC

## 2019-12-05 NOTE — Progress Notes (Signed)
12/05/2019 10:33 AM   Anne Ross 12/08/1940 [id number]  Referring provider: Sofie Hartigan, MD [place],  [place] [address]  Chief Complaint  Patient presents with  . Recurrent UTI    HPI: 79 y.o. female with recurrent UTIs presents for annual follow-up.  She was placed on low-dose trimethoprim suppression by Dr. Matilde Sprang approximately 3 years ago and has done well.  Since her visit last year she has not had symptoms or recurrent infection.  She did have an extended bout of COVID-19 from October-December but is feeling better.  She is asymptomatic today.   PMH: Past Medical History:  Diagnosis Date  . Cancer ([place])    uterine  . History of COVID-19   . Hypertension     Surgical History: Past Surgical History:  Procedure Laterality Date  . ABDOMINAL HYSTERECTOMY    . BREAST BIOPSY Left    benign 2 times  . BREAST BIOPSY Right    neg  . BREAST CYST ASPIRATION Left    neg    Home Medications:  Allergies as of 12/05/2019      Reactions   Cortisone Hives   Other Hives      Medication List       Accurate as of December 05, 2019 10:33 AM. If you have any questions, ask your nurse or doctor.        aspirin EC 81 MG tablet Take by mouth.   Calcipotriene 0.005 % solution   Calcium 500 + D 500-125 MG-UNIT Tabs Generic drug: Calcium Carbonate-Vitamin D Take by mouth.   cetirizine 10 MG tablet Commonly known as: ZYRTEC Take 10 mg by mouth daily.   fluticasone 50 MCG/ACT nasal spray Commonly known as: FLONASE Place into both nostrils daily.   folic acid [id number] MCG tablet Commonly known as: FOLVITE Take 400 mcg by mouth daily.   metoprolol succinate 50 MG 24 hr tablet Commonly known as: TOPROL-XL TAKE 1 TABLET(50 MG) BY MOUTH EVERY DAY   metoprolol tartrate 50 MG tablet Commonly known as: LOPRESSOR Take 50 mg by mouth 2 (two) times daily.   pimecrolimus 1 % cream Commonly known as: ELIDEL Apply topically.   pyridoxine 100 MG  tablet Commonly known as: B-6 Take 100 mg by mouth daily.   ramipril 5 MG capsule Commonly known as: ALTACE   rosuvastatin 5 MG tablet Commonly known as: CRESTOR Take by mouth.   trimethoprim 100 MG tablet Commonly known as: TRIMPEX Take 1 tablet (100 mg total) by mouth daily.   vitamin B-12 250 MCG tablet Commonly known as: CYANOCOBALAMIN Take 250 mcg by mouth daily.   Vitamin D 50 MCG (2000 UT) tablet Take 2,000 Units by mouth daily.       Allergies:  Allergies  Allergen Reactions  . Cortisone Hives  . Other Hives    Family History: Family History  Problem Relation Age of Onset  . Prostate cancer Maternal Grandfather   . Breast cancer Neg Hx     Social History:  reports that she has never smoked. She has never used smokeless tobacco. She reports that she does not drink alcohol or use drugs.  ROS: UROLOGY Frequent Urination?: No Hard to postpone urination?: No Burning/pain with urination?: No Get up at night to urinate?: No Leakage of urine?: No Urine stream starts and stops?: No Trouble starting stream?: No Do you have to strain to urinate?: No Blood in urine?: No Urinary tract infection?: No Sexually transmitted disease?: No Injury to kidneys  or bladder?: No Painful intercourse?: No Weak stream?: No Currently pregnant?: No Vaginal bleeding?: No Last menstrual period?: N/A  Gastrointestinal Nausea?: No Vomiting?: No Indigestion/heartburn?: No Diarrhea?: No Constipation?: No  Constitutional Fever: No Night sweats?: No Weight loss?: No Fatigue?: No  Skin Skin rash/lesions?: No Itching?: No  Eyes Blurred vision?: No Double vision?: No  Ears/Nose/Throat Sore throat?: No Sinus problems?: No  Hematologic/Lymphatic Swollen glands?: No Easy bruising?: No  Cardiovascular Leg swelling?: No Chest pain?: No  Respiratory Cough?: No Shortness of breath?: No  Endocrine Excessive thirst?: No  Musculoskeletal Back pain?: No Joint  pain?: No  Neurological Headaches?: No Dizziness?: No  Psychologic Depression?: No Anxiety?: No  Physical Exam: BP 135/71 (BP Location: Left Arm, Patient Position: Sitting, Cuff Size: Normal)   Pulse 76   Ht 5\' 3"  (1.6 m)   Wt 147 lb 11.2 oz (67 kg)   BMI 26.16 kg/m   Constitutional:  Alert and oriented, No acute distress. HEENT: [place] AT, moist mucus membranes.  Trachea midline, no masses. Cardiovascular: No clubbing, cyanosis, or edema. Respiratory: Normal respiratory effort, no increased work of breathing. Skin: No rashes, bruises or suspicious lesions. Neurologic: Grossly intact, no focal deficits, moving all 4 extremities. Psychiatric: Normal mood and affect.  Laboratory Data:  Urinalysis Dipstick/microscopy negative  Assessment & Plan:    - Recurrent UTI Doing well on low-dose trimethoprim suppression.  Refill was sent to pharmacy.  Since she has done well she inquired if I would be okay if Dr. Ellison Hughs would refill her Rx annually and she return here as needed for any change in her symptoms.  I told her I would be fine with this if Dr. Ellison Hughs is comfortable prescribing.   Abbie Sons, [facility] [address] [place],  [address] [phone]

## 2019-12-06 LAB — URINALYSIS, COMPLETE
Bilirubin, UA: NEGATIVE
Glucose, UA: NEGATIVE
Ketones, UA: NEGATIVE
Leukocytes,UA: NEGATIVE
Nitrite, UA: NEGATIVE
Protein,UA: NEGATIVE
RBC, UA: NEGATIVE
Specific Gravity, UA: 1.02 (ref 1.005–1.030)
Urobilinogen, Ur: 0.2 mg/dL (ref 0.2–1.0)
pH, UA: 5.5 (ref 5.0–7.5)

## 2019-12-06 LAB — MICROSCOPIC EXAMINATION
Bacteria, UA: NONE SEEN
RBC: NONE SEEN /hpf (ref 0–2)

## 2019-12-07 ENCOUNTER — Encounter: Payer: Self-pay | Admitting: Urology

## 2020-03-22 IMAGING — MG DIGITAL SCREENING BILATERAL MAMMOGRAM WITH TOMO AND CAD
8 series · 9 of 24 positions shown · non-contrast
Comparison: Previous exam(s).

CLINICAL DATA: Screening.

EXAM:
DIGITAL SCREENING BILATERAL MAMMOGRAM WITH TOMO AND CAD

[R CC synth-2D]
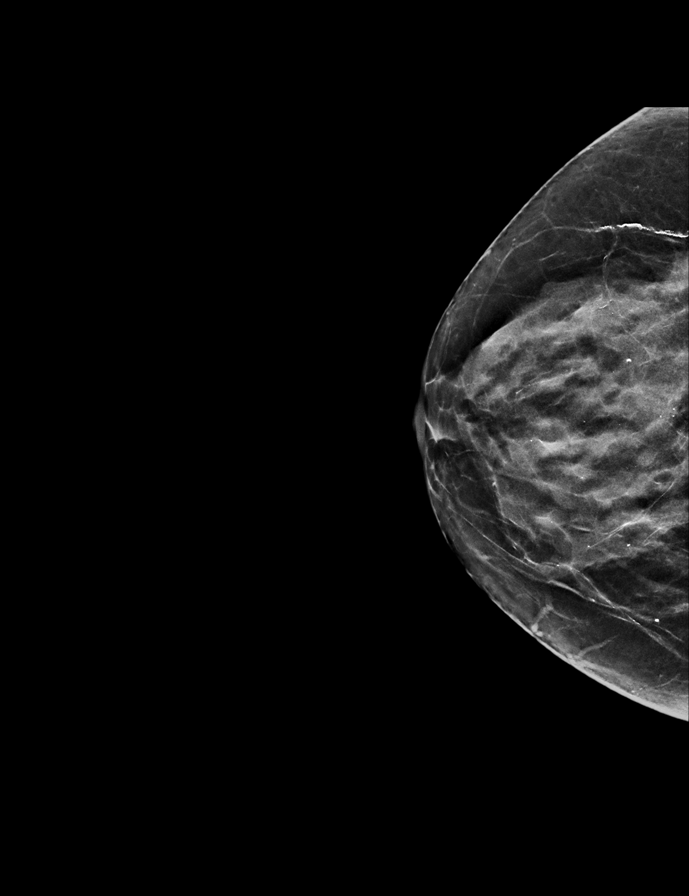

[L CC synth-2D]
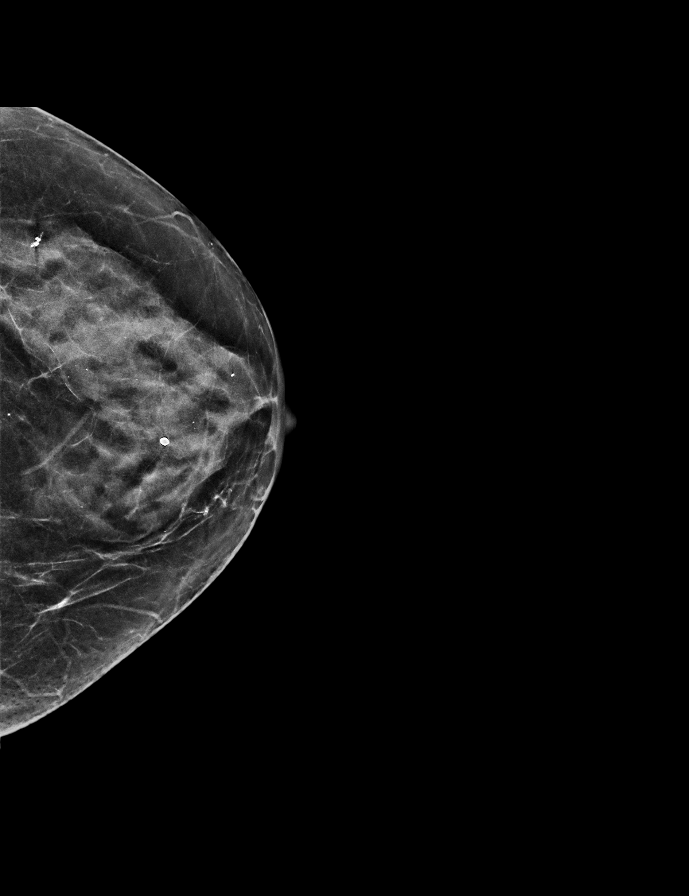

[L MLO synth-2D]
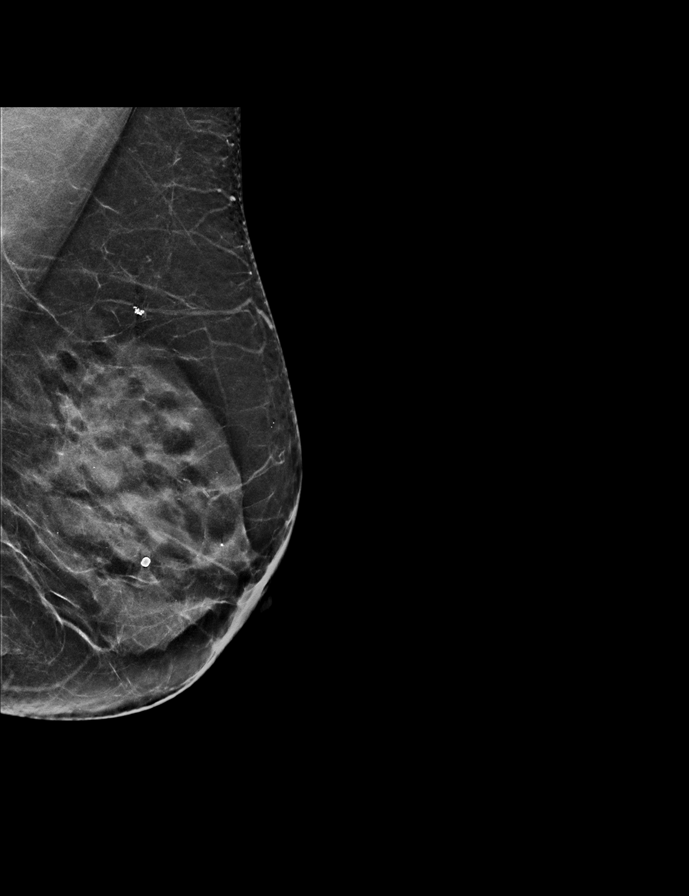

[R MLO synth-2D]
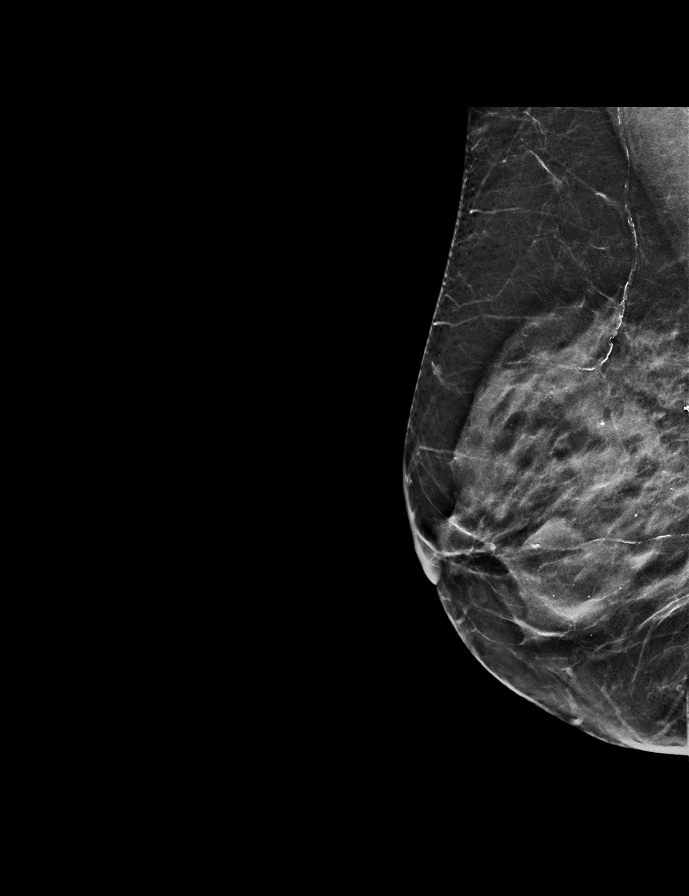

[L MLO tomo · 2 of 51 frames shown]
[frame 17/51]
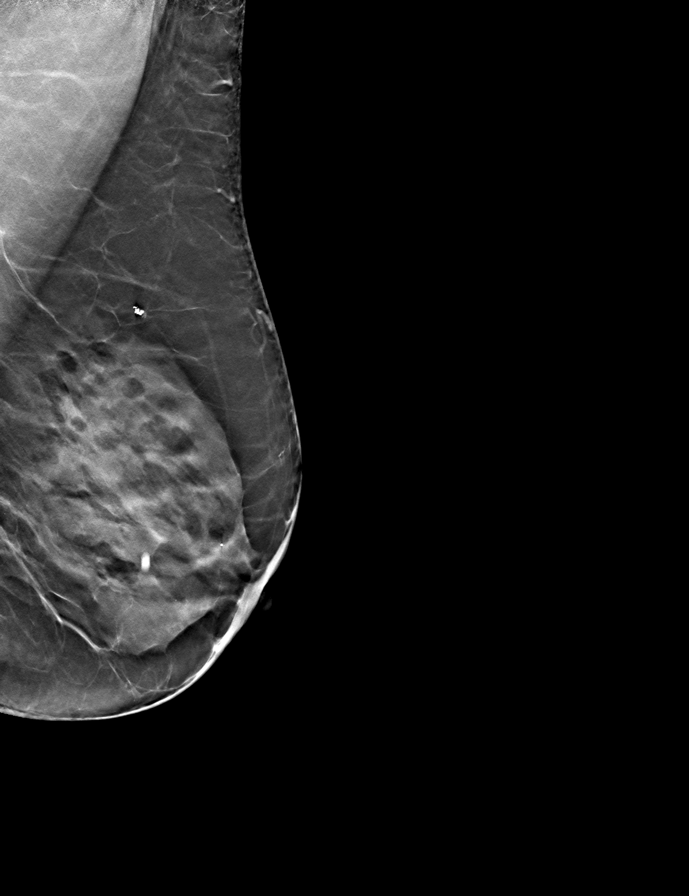
[frame 26/51]
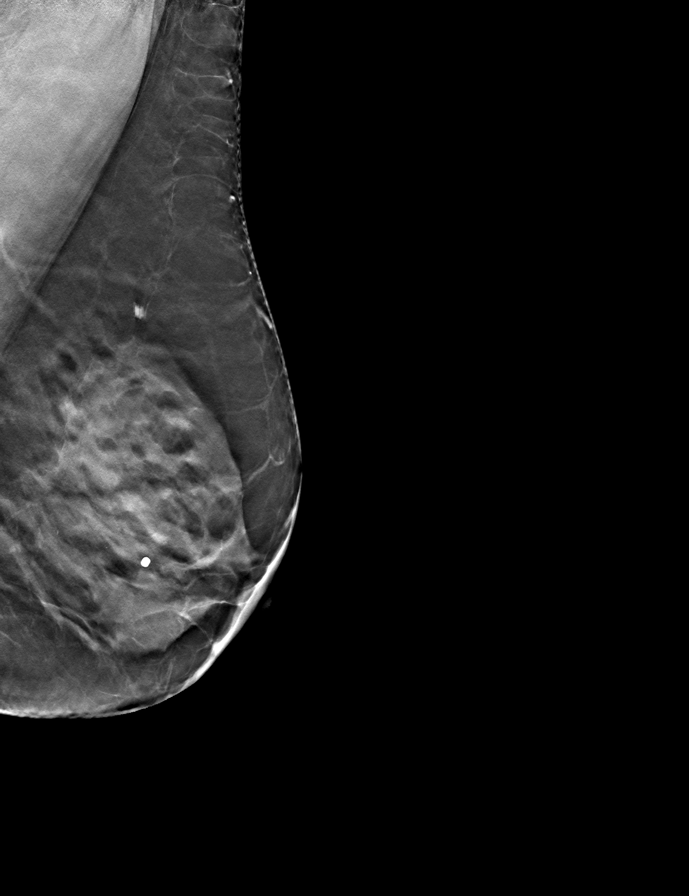

[L CC tomo · tomo slice 25/48.0]
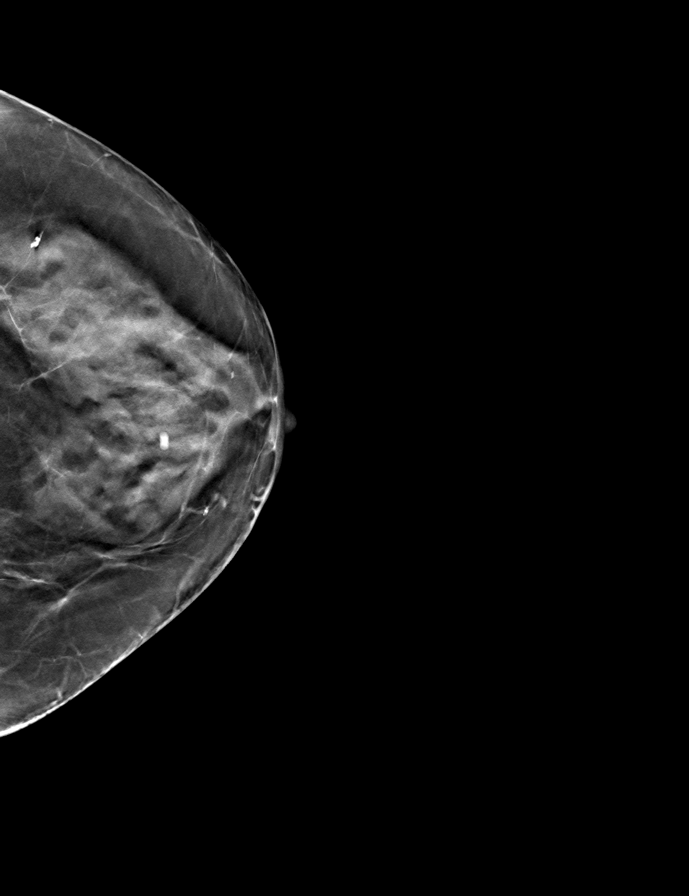

[R MLO tomo · tomo slice 26/51.0]
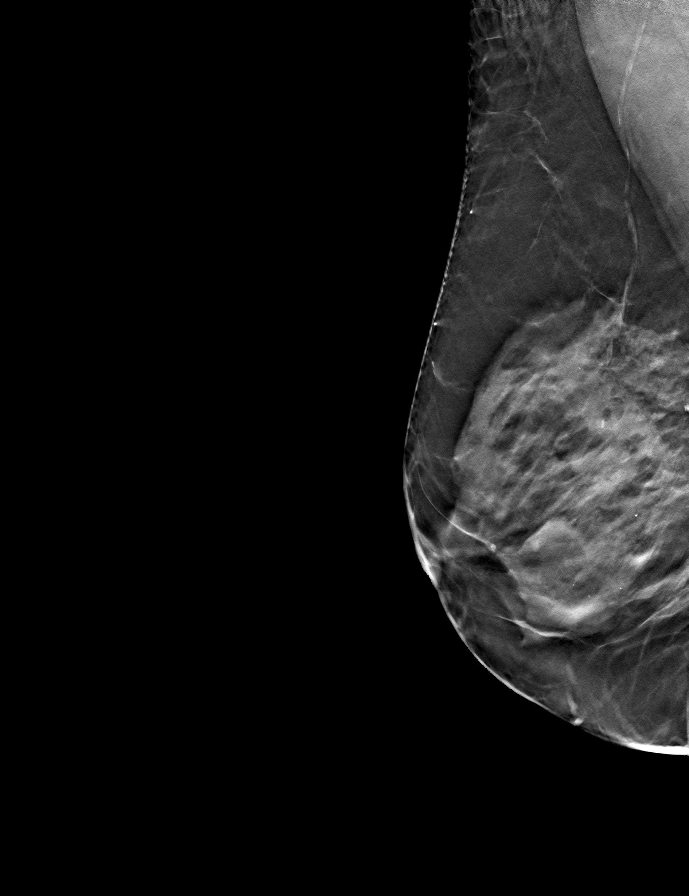

[R CC tomo · tomo slice 25/50.0]
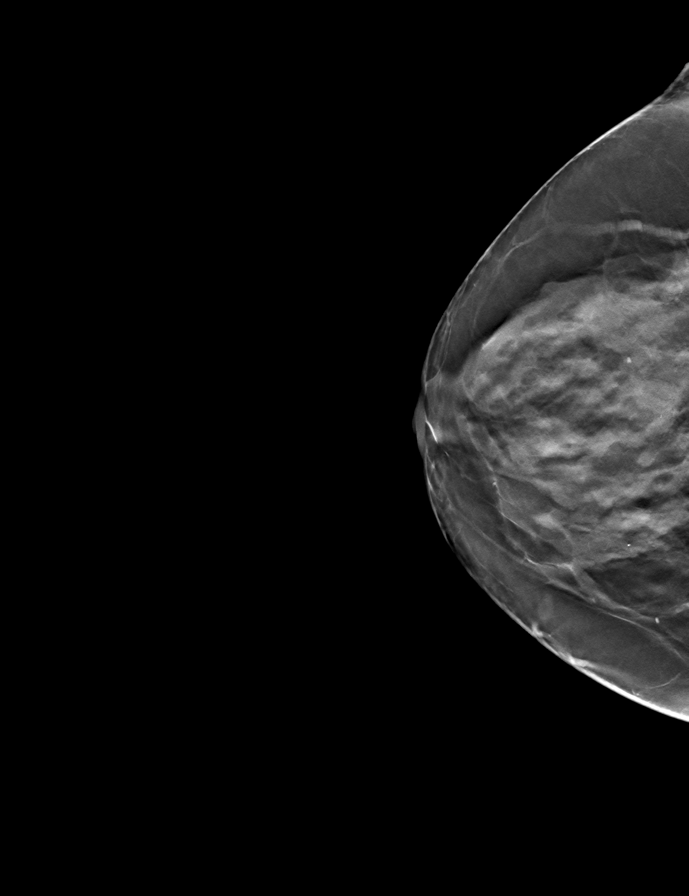

[9 of 24 positions shown; findings below may reference images not displayed]

ACR Breast Density Category c: The breast tissue is heterogeneously
dense, which may obscure small masses.
FINDINGS: There are no findings suspicious for malignancy. Images were
processed with CAD.
IMPRESSION: No mammographic evidence of malignancy. A result letter of this
screening mammogram will be mailed directly to the patient.

RECOMMENDATION:
Screening mammogram in one year. (Code:[id number])

BI-RADS CATEGORY  1: Negative.

## 2020-05-22 ENCOUNTER — Ambulatory Visit: Payer: Medicare PPO | Admitting: Physician Assistant

## 2020-05-22 ENCOUNTER — Encounter: Payer: Self-pay | Admitting: Physician Assistant

## 2020-05-22 ENCOUNTER — Encounter (INDEPENDENT_AMBULATORY_CARE_PROVIDER_SITE_OTHER): Payer: Self-pay

## 2020-05-22 ENCOUNTER — Other Ambulatory Visit: Payer: Self-pay

## 2020-05-22 ENCOUNTER — Telehealth: Payer: Self-pay | Admitting: Physician Assistant

## 2020-05-22 VITALS — BP 145/69 | HR 69 | Ht 63.0 in | Wt 147.0 lb

## 2020-05-22 DIAGNOSIS — N390 Urinary tract infection, site not specified: Secondary | ICD-10-CM

## 2020-05-22 MED ORDER — NITROFURANTOIN MONOHYD MACRO 100 MG PO CAPS: 100.0000 mg | ORAL_CAPSULE | Freq: Two times a day (BID) | ORAL | 0 refills | Status: AC

## 2020-05-22 NOTE — Progress Notes (Signed)
05/22/2020 11:45 AM   Anne Ross [birth year] [id number]  CC: Chief Complaint  Patient presents with  . Urinary Tract Infection    HPI: Anne Ross is a 79 y.o. female with PMH recurrent UTI on daily trimethoprim who presents today for evaluation of possible UTI.   Today, she reports a 1 day history of dysuria, lower abdominal discomfort, frequency, cloudy urine, and chills.  She denies fever, nausea, and vomiting.  She has been taking trimethoprim daily, last positive urine culture in January 2020 with ampicillin and Bactrim resistant E. coli.  In-office UA today positive for trace-lysed blood and 1+ leukocyte esterase; urine microscopy with >30 WBCs/HPF  PMH: Past Medical History:  Diagnosis Date  . Cancer ([facility])    uterine  . History of COVID-19   . Hypertension     Surgical History: Past Surgical History:  Procedure Laterality Date  . ABDOMINAL HYSTERECTOMY    . BREAST BIOPSY Left    benign 2 times  . BREAST BIOPSY Right    neg  . BREAST CYST ASPIRATION Left    neg    Home Medications:  Allergies as of 05/22/2020      Reactions   Cortisone Hives   Other Hives      Medication List       Accurate as of May 22, 2020 11:45 AM. If you have any questions, ask your nurse or doctor.        aspirin EC 81 MG tablet Take by mouth.   Calcipotriene 0.005 % solution   Calcium 500 + D 500-125 MG-UNIT Tabs Generic drug: Calcium Carbonate-Vitamin D Take by mouth.   cetirizine 10 MG tablet Commonly known as: ZYRTEC Take 10 mg by mouth daily.   chlorhexidine 0.12 % solution Commonly known as: PERIDEX   fluticasone 50 MCG/ACT nasal spray Commonly known as: FLONASE Place into both nostrils daily.   folic acid 735 MCG tablet Commonly known as: FOLVITE Take 400 mcg by mouth daily.   metoprolol succinate 50 MG 24 hr tablet Commonly known as: TOPROL-XL TAKE 1 TABLET(50 MG) BY MOUTH EVERY DAY   metoprolol tartrate 50 MG tablet Commonly  known as: LOPRESSOR Take 50 mg by mouth 2 (two) times daily.   nitrofurantoin (macrocrystal-monohydrate) 100 MG capsule Commonly known as: MACROBID Take 1 capsule (100 mg total) by mouth every 12 (twelve) hours for 5 days. Started by: Debroah Loop, PA-C   pimecrolimus 1 % cream Commonly known as: ELIDEL Apply topically.   pravastatin 10 MG tablet Commonly known as: PRAVACHOL   pyridoxine 100 MG tablet Commonly known as: B-6 Take 100 mg by mouth daily.   ramipril 5 MG capsule Commonly known as: ALTACE   rosuvastatin 5 MG tablet Commonly known as: CRESTOR Take by mouth.   trimethoprim 100 MG tablet Commonly known as: TRIMPEX Take 1 tablet (100 mg total) by mouth daily.   vitamin B-12 250 MCG tablet Commonly known as: CYANOCOBALAMIN Take 250 mcg by mouth daily.   Vitamin D 50 MCG (2000 UT) tablet Take 2,000 Units by mouth daily.       Allergies:  Allergies  Allergen Reactions  . Cortisone Hives  . Other Hives    Family History: Family History  Problem Relation Age of Onset  . Prostate cancer Maternal Grandfather   . Breast cancer Neg Hx     Social History:   reports that she has never smoked. She has never used smokeless tobacco. She reports that she does not drink alcohol  and does not use drugs.  Physical Exam: BP (!) 145/69   Pulse 69   Ht 5\' 3"  (1.6 m)   Wt 147 lb (66.7 kg)   BMI 26.04 kg/m   Constitutional:  Alert and oriented, no acute distress, nontoxic appearing HEENT: [place], AT Cardiovascular: No clubbing, cyanosis, or edema Respiratory: Normal respiratory effort, no increased work of breathing Skin: No rashes, bruises or suspicious lesions Neurologic: Grossly intact, no focal deficits, moving all 4 extremities Psychiatric: Normal mood and affect  Laboratory Data: Results for orders placed or performed in visit on 05/22/20  Microscopic Examination   Urine  Result Value Ref Range   WBC, UA >30 (A) 0 - 5 /hpf   RBC 0-2 0 - 2 /hpf    Epithelial Cells (non renal) 0-10 0 - 10 /hpf   Bacteria, UA Few None seen/Few  Urinalysis, Complete  Result Value Ref Range   Specific Gravity, UA 1.010 1.005 - 1.030   pH, UA 7.0 5.0 - 7.5   Color, UA Straw Yellow   Appearance Ur Cloudy (A) Clear   Leukocytes,UA 1+ (A) Negative   Protein,UA Negative Negative/Trace   Glucose, UA Negative Negative   Ketones, UA Negative Negative   RBC, UA Trace (A) Negative   Bilirubin, UA Negative Negative   Urobilinogen, Ur 0.2 0.2 - 1.0 mg/dL   Nitrite, UA Negative Negative   Microscopic Examination See below:    Assessment & Plan:   1. Recurrent UTI UA notable for pyuria today, in a typical finding for this patient.  We will start her on empiric Macrobid for breakthrough UTI.  Counseled her to not take daily trimethoprim while on this medication.  Explained that I would contact her if her urine culture results indicate a necessary change in therapy.  She expressed understanding. - Urinalysis, Complete - CULTURE, URINE COMPREHENSIVE - nitrofurantoin, macrocrystal-monohydrate, (MACROBID) 100 MG capsule; Take 1 capsule (100 mg total) by mouth every 12 (twelve) hours for 5 days.  Dispense: 10 capsule; Refill: 0  Return if symptoms worsen or fail to improve.  Debroah Loop, PA-C  [facility] [address], [place],  [address] [phone]

## 2020-05-22 NOTE — Patient Instructions (Signed)
Start Macrobid twice daily x5 days for treatment of a UTI. Do not take your daily trimethoprim while you are on this medication.  I have sent your urine for culture today. I will call you if I need to switch your antibiotic based on these results.  If you start to feel worse rather than better and develop a fever, nausea, or vomiting, call our office or go to the Emergency Department.

## 2020-05-22 NOTE — Telephone Encounter (Signed)
Pt called office requesting to have her urine checked. Pt states she is on low dose of abx for uti prevention due to hx of sepsis, pt states she's not one to get uti often but when she does it's pretty bad and has ended up hospitalized due to sepsis.  Pt denies nausea and vomiting, not sure of fever but has had chills this morning, cloudy urine and burning w/urinationa and over all just doesn't feel well at all.  Added pt to sched this a.m.

## 2020-05-23 LAB — URINALYSIS, COMPLETE
Bilirubin, UA: NEGATIVE
Glucose, UA: NEGATIVE
Ketones, UA: NEGATIVE
Nitrite, UA: NEGATIVE
Protein,UA: NEGATIVE
Specific Gravity, UA: 1.01 (ref 1.005–1.030)
Urobilinogen, Ur: 0.2 mg/dL (ref 0.2–1.0)
pH, UA: 7 (ref 5.0–7.5)

## 2020-05-23 LAB — MICROSCOPIC EXAMINATION: WBC, UA: >30 /hpf — AB (ref 0–5)

## 2020-05-26 LAB — CULTURE, URINE COMPREHENSIVE

## 2020-09-24 ENCOUNTER — Other Ambulatory Visit: Payer: Self-pay

## 2020-09-24 ENCOUNTER — Ambulatory Visit: Payer: Medicare PPO | Admitting: Dermatology

## 2020-09-24 DIAGNOSIS — Z888 Allergy status to other drugs, medicaments and biological substances status: Secondary | ICD-10-CM

## 2020-09-24 DIAGNOSIS — L661 Lichen planopilaris: Secondary | ICD-10-CM

## 2020-09-24 NOTE — Progress Notes (Signed)
   Follow-Up Visit   Subjective  Anne Ross is a 79 y.o. female who presents for the following: Follow-up (FFA/Lichen Planus follow up - she saw Dr Leonie Green in August).  The following portions of the chart were reviewed this encounter and updated as appropriate:  Tobacco  Allergies  Meds  Problems  Med Hx  Surg Hx  Fam Hx     Review of Systems:  No other skin or systemic complaints except as noted in HPI or Assessment and Plan.  Objective  Well appearing patient in no apparent distress; mood and affect are within normal limits.  A focused examination was performed including scalp. Relevant physical exam findings are noted in the Assessment and Plan.  Objective  Scalp: Pinkness of scalp  Images           Assessment & Plan    Drug allergy - Patient is allergic to steroids.   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 Lois Huxley - [facility] today. Her plan is as follows: 1. Discussed diagnoses and treatment options. 2. The patient deferred all treatments discussed, including: Lasergenesis, hair restoration, oral finasteride because she does not want a treatment unless it will grow her hair back. Discussed why this is not possible at this time.  - RTC as needed.   She has a steroid allergy so she is not using a topical steroid.  Discussed Deeann Cree and her benefits have been checked and the procedure is not covered by her insurance. [place] treatment also an option.  Continue Calcipotriene solution qhs, Elidel cream qd.  Patient asked about shaving hair to help control the crusted areas. Advised patient shaving will not help because the hair follicles will continue to get inflamed with or without hair.  Discussed Morrie Sheldon. Will research to see if it is reasonable to try to treat with a compounded topical cream. Or Opzelura (topical ruxolitinib)  Return in about 4 months (around  01/25/2021) for TBSE and 6 months for follow up of scalp.  I, Ashok Cordia, CMA, am acting as scribe for Sarina Ser, MD .  Documentation: I have reviewed the above documentation for accuracy and completeness, and I agree with the above.  Sarina Ser, MD

## 2020-09-25 ENCOUNTER — Encounter: Payer: Self-pay | Admitting: Dermatology

## 2020-12-04 ENCOUNTER — Ambulatory Visit: Payer: Medicare PPO | Admitting: Urology

## 2020-12-04 ENCOUNTER — Encounter: Payer: Self-pay | Admitting: Urology

## 2020-12-04 ENCOUNTER — Other Ambulatory Visit: Payer: Self-pay

## 2020-12-04 VITALS — BP 110/64 | HR 80 | Ht 63.0 in | Wt 145.0 lb

## 2020-12-04 DIAGNOSIS — N390 Urinary tract infection, site not specified: Secondary | ICD-10-CM | POA: Diagnosis not present

## 2020-12-04 MED ORDER — TRIMETHOPRIM 100 MG PO TABS: 100.0000 mg | ORAL_TABLET | Freq: Every day | ORAL | 3 refills | Status: DC

## 2020-12-04 NOTE — Progress Notes (Signed)
12/04/2020 2:26 PM   Anne Ross [place] 1941/09/13 [id number]  Referring provider: Marina Goodell, MD [address] [place],  [place] [address]  Chief Complaint  Patient presents with  . Recurrent UTI    HPI: 80 y.o. female presents for annual follow-up of recurrent UTI.   Saw Anne Ross 05/22/2020 complaining of dysuria, pelvic discomfort and frequency; UA with pyuria and culture positive for E. coli which was resistant to trimethoprim/sulfa  Was treated with nitrofurantoin with resolution of symptoms  She remains on trimethoprim suppression and that was the only infection she had last year  No complaints today   PMH: Past Medical History:  Diagnosis Date  . Cancer ([facility])    uterine  . History of COVID-19   . Hypertension     Surgical History: Past Surgical History:  Procedure Laterality Date  . ABDOMINAL HYSTERECTOMY    . BREAST BIOPSY Left    benign 2 times  . BREAST BIOPSY Right    neg  . BREAST CYST ASPIRATION Left    neg    Home Medications:  Allergies as of 12/04/2020      Reactions   Cortisone Hives   Other Hives      Medication List       Accurate as of December 04, 2020  2:26 PM. If you have any questions, ask your nurse or doctor.        STOP taking these medications   metoprolol tartrate 50 MG tablet Commonly known as: LOPRESSOR Stopped by: Riki Altes, MD     TAKE these medications   aspirin EC 81 MG tablet Take by mouth.   Calcipotriene 0.005 % solution   Calcium Carbonate-Vitamin D 500-125 MG-UNIT Tabs Take by mouth.   cetirizine 10 MG tablet Commonly known as: ZYRTEC Take 10 mg by mouth daily.   chlorhexidine 0.12 % solution Commonly known as: PERIDEX   fluticasone 50 MCG/ACT nasal spray Commonly known as: FLONASE Place into both nostrils daily.   folic acid 800 MCG tablet Commonly known as: FOLVITE Take 400 mcg by mouth daily.   metoprolol succinate 50 MG 24 hr tablet Commonly known as:  TOPROL-XL TAKE 1 TABLET(50 MG) BY MOUTH EVERY DAY   pimecrolimus 1 % cream Commonly known as: ELIDEL Apply topically.   pravastatin 10 MG tablet Commonly known as: PRAVACHOL   pyridoxine 100 MG tablet Commonly known as: B-6 Take 100 mg by mouth daily.   ramipril 5 MG capsule Commonly known as: ALTACE   rosuvastatin 5 MG tablet Commonly known as: CRESTOR Take by mouth.   trimethoprim 100 MG tablet Commonly known as: TRIMPEX Take 1 tablet (100 mg total) by mouth daily.   vitamin B-12 250 MCG tablet Commonly known as: CYANOCOBALAMIN Take 250 mcg by mouth daily.   Vitamin D 50 MCG (2000 UT) tablet Take 2,000 Units by mouth daily.       Allergies:  Allergies  Allergen Reactions  . Cortisone Hives  . Other Hives    Family History: Family History  Problem Relation Age of Onset  . Prostate cancer Maternal Grandfather   . Breast cancer Neg Hx     Social History:  reports that she has never smoked. She has never used smokeless tobacco. She reports that she does not drink alcohol and does not use drugs.   Physical Exam: BP 110/64   Pulse 80   Ht 5\' 3"  (1.6 m)   Wt 145 lb (65.8 kg)   BMI 25.69 kg/m  Constitutional:  Alert and oriented, No acute distress. HEENT: [place] AT, moist mucus membranes.  Trachea midline, no masses. Cardiovascular: No clubbing, cyanosis, or edema. Respiratory: Normal respiratory effort, no increased work of breathing. Skin: No rashes, bruises or suspicious lesions. Neurologic: Grossly intact, no focal deficits, moving all 4 extremities. Psychiatric: Normal mood and affect.  Laboratory Data:  Urinalysis Dipstick/microscopy negative  Assessment & Plan:    1. Recurrent UTI  Doing well on trimethoprim suppression  Trimethoprim refill sent  Continue annual follow-up   Abbie Sons, MD  [facility] [address], [place], [place] [address] [phone]

## 2020-12-05 LAB — MICROSCOPIC EXAMINATION
Bacteria, UA: NONE SEEN
RBC, Urine: NONE SEEN /hpf (ref 0–2)

## 2020-12-05 LAB — URINALYSIS, COMPLETE
Bilirubin, UA: NEGATIVE
Glucose, UA: NEGATIVE
Ketones, UA: NEGATIVE
Leukocytes,UA: NEGATIVE
Nitrite, UA: NEGATIVE
Protein,UA: NEGATIVE
RBC, UA: NEGATIVE
Specific Gravity, UA: 1.01 (ref 1.005–1.030)
Urobilinogen, Ur: 0.2 mg/dL (ref 0.2–1.0)
pH, UA: 6 (ref 5.0–7.5)

## 2021-01-02 ENCOUNTER — Other Ambulatory Visit: Payer: Self-pay | Admitting: Urology

## 2021-01-07 ENCOUNTER — Other Ambulatory Visit: Payer: Self-pay

## 2021-01-07 MED ORDER — TRIMETHOPRIM 100 MG PO TABS: 100.0000 mg | ORAL_TABLET | Freq: Every day | ORAL | 3 refills | Status: DC

## 2021-01-07 NOTE — Telephone Encounter (Signed)
Pharmacy called stating never received patients rx for trimethoprim.

## 2021-01-19 ENCOUNTER — Ambulatory Visit: Payer: Medicare PPO | Admitting: Dermatology

## 2021-01-19 ENCOUNTER — Other Ambulatory Visit: Payer: Self-pay

## 2021-01-19 ENCOUNTER — Encounter: Payer: Self-pay | Admitting: Dermatology

## 2021-01-19 DIAGNOSIS — D485 Neoplasm of uncertain behavior of skin: Secondary | ICD-10-CM | POA: Diagnosis not present

## 2021-01-19 DIAGNOSIS — L661 Lichen planopilaris: Secondary | ICD-10-CM | POA: Diagnosis not present

## 2021-01-19 DIAGNOSIS — Z1283 Encounter for screening for malignant neoplasm of skin: Secondary | ICD-10-CM

## 2021-01-19 DIAGNOSIS — L820 Inflamed seborrheic keratosis: Secondary | ICD-10-CM | POA: Diagnosis not present

## 2021-01-19 DIAGNOSIS — D229 Melanocytic nevi, unspecified: Secondary | ICD-10-CM

## 2021-01-19 DIAGNOSIS — D1800 Hemangioma unspecified site: Secondary | ICD-10-CM

## 2021-01-19 DIAGNOSIS — Z85828 Personal history of other malignant neoplasm of skin: Secondary | ICD-10-CM

## 2021-01-19 DIAGNOSIS — T380X5A Adverse effect of glucocorticoids and synthetic analogues, initial encounter: Secondary | ICD-10-CM

## 2021-01-19 DIAGNOSIS — D492 Neoplasm of unspecified behavior of bone, soft tissue, and skin: Secondary | ICD-10-CM

## 2021-01-19 DIAGNOSIS — T7840XA Allergy, unspecified, initial encounter: Secondary | ICD-10-CM

## 2021-01-19 DIAGNOSIS — L814 Other melanin hyperpigmentation: Secondary | ICD-10-CM

## 2021-01-19 DIAGNOSIS — L821 Other seborrheic keratosis: Secondary | ICD-10-CM

## 2021-01-19 DIAGNOSIS — L578 Other skin changes due to chronic exposure to nonionizing radiation: Secondary | ICD-10-CM

## 2021-01-19 NOTE — Progress Notes (Signed)
Follow-Up Visit   Subjective  Anne Ross is a 80 y.o. female who presents for the following: Annual Exam and Follow-up (Follow-up (FFA/Lichen Planus follow up - she saw Dr Leonie Green in August 2021). Hx off BCC on the chest several years ago. PT c/o growth on the L chest changing. Check irritated itchy  spots L forearm and neck.  The patient presents for Total-Body Skin Exam (TBSE) for skin cancer screening and mole check.  The following portions of the chart were reviewed this encounter and updated as appropriate:   Tobacco  Allergies  Meds  Problems  Med Hx  Surg Hx  Fam Hx     Review of Systems:  No other skin or systemic complaints except as noted in HPI or Assessment and Plan.  Objective  Well appearing patient in no apparent distress; mood and affect are within normal limits.  A full examination was performed including scalp, head, eyes, ears, nose, lips, neck, chest, axillae, abdomen, back, buttocks, bilateral upper extremities, bilateral lower extremities, hands, feet, fingers, toes, fingernails, and toenails. All findings within normal limits unless otherwise noted below.  Objective  frontal scalp: Hair loss and pinkness of the scalp   Objective  Left forearm, anterior neck (2): Erythematous keratotic or waxy stuck-on papule or plaque.   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   She is continued to use calcipotriene and Elidel. (She is allergic to steroids and therefore cannot use them for this condition.) She has seen Dr. Lois Huxley in the past and Dr. Leonie Green had no other recommendations for treatment. I discussed today option of using oral dutasteride or oral finasteride.  She declines these  treatment at this time.  She states unless there is a treatment that will make her hair regrow but she does not want to consider any other treatments.  Advised today and in the past that we cannot make the hair regrowth as it scarred down.  She understands this  Reviewed notes from Dr. Lois Huxley - [facility] today. Her plan is as follows: 1. Discussed diagnoses and treatment options. 2. The patient deferred all treatments discussed, including: Lasergenesis, hair restoration, oral finasteride because she does not want a treatment unless it will grow her hair back. Discussed why this is not possible at this time.  - RTC as needed.    Continue topical calcipotriene and topical Elidel for now.  Drug allergy - Patient is allergic to steroids. She has a steroid allergy so she is not using a topical steroid For her FFA. Continue Calcipotriene solution qhs, Elidel cream qd.   Inflamed seborrheic keratosis (2) Left forearm, anterior neck  Destruction of lesion - Left forearm, anterior neck Complexity: simple   Destruction method: cryotherapy   Informed consent: discussed and consent obtained   Timeout:  patient name, date of birth, surgical site, and procedure verified Lesion destroyed using liquid nitrogen: Yes   Region frozen until ice ball extended beyond lesion: Yes   Outcome: patient tolerated procedure well with no complications   Post-procedure details: wound care instructions given    Neoplasm of skin L lateral supraclavicular  Epidermal / dermal shaving  Lesion diameter (cm):  0.6 Informed consent: discussed and consent obtained   Timeout: patient name, date of birth,  surgical site, and procedure verified   Procedure prep:  Patient was prepped and draped in usual sterile fashion Prep type:  Isopropyl alcohol Anesthesia: the lesion was anesthetized in a standard fashion   Anesthetic:  1% lidocaine w/ epinephrine 1-100,000 buffered w/ 8.4% NaHCO3 Hemostasis achieved  with: pressure, aluminum chloride and electrodesiccation   Outcome: patient tolerated procedure well   Post-procedure details: sterile dressing applied and wound care instructions given   Dressing type: bandage and petrolatum    Specimen 1 - Surgical pathology Differential Diagnosis: R/O ISK vs Hemangioma vs other   Check Margins: No 0.6 cm Irregular brown papule  ISK vs Hemangioma vs other  Skin cancer screening   Lentigines - Scattered tan macules - Discussed due to sun exposure - Benign, observe - Call for any changes  Seborrheic Keratoses - Stuck-on, waxy, tan-brown papules and plaques  - Discussed benign etiology and prognosis. - Observe - Call for any changes  Melanocytic Nevi - Tan-brown and/or pink-flesh-colored symmetric macules and papules - Benign appearing on exam today - Observation - Call clinic for new or changing moles - Recommend daily use of broad spectrum spf 30+ sunscreen to sun-exposed areas.   Hemangiomas - Red papules - Discussed benign nature - Observe - Call for any changes  Actinic Damage - Chronic, secondary to cumulative UV/sun exposure - diffuse scaly erythematous macules with underlying dyspigmentation - Recommend daily broad spectrum sunscreen SPF 30+ to sun-exposed areas, reapply every 2 hours as needed.  - Call for new or changing lesions.  History of Basal Cell Carcinoma of the Skin chest - No evidence of recurrence today - Recommend regular full body skin exams - Recommend daily broad spectrum sunscreen SPF 30+ to sun-exposed areas, reapply every 2 hours as needed.  - Call if any new or changing lesions are noted between office visits  Skin cancer screening performed today.  Return in about 1 year (around 01/19/2022) for TBSE.  IMarye Round, CMA, am acting as scribe for Sarina Ser, MD .  Documentation: I have reviewed the above documentation for accuracy and completeness, and I agree with the above.  Sarina Ser, MD

## 2021-01-19 NOTE — Progress Notes (Deleted)
   Follow-Up Visit   Subjective  Anne Ross is a 80 y.o. female who presents for the following: Annual Exam.   The following portions of the chart were reviewed this encounter and updated as appropriate:       Review of Systems:  No other skin or systemic complaints except as noted in HPI or Assessment and Plan.  Objective  Well appearing patient in no apparent distress; mood and affect are within normal limits.  A full examination was performed including scalp, head, eyes, ears, nose, lips, neck, chest, axillae, abdomen, back, buttocks, bilateral upper extremities, bilateral lower extremities, hands, feet, fingers, toes, fingernails, and toenails. All findings within normal limits unless otherwise noted below.    Assessment & Plan    Lentigines - Scattered tan macules - Discussed due to sun exposure - Benign, observe - Call for any changes  Seborrheic Keratoses - Stuck-on, waxy, tan-brown papules and plaques  - Discussed benign etiology and prognosis. - Observe - Call for any changes  Melanocytic Nevi - Tan-brown and/or pink-flesh-colored symmetric macules and papules - Benign appearing on exam today - Observation - Call clinic for new or changing moles - Recommend daily use of broad spectrum spf 30+ sunscreen to sun-exposed areas.   Hemangiomas - Red papules - Discussed benign nature - Observe - Call for any changes  Actinic Damage - Chronic, secondary to cumulative UV/sun exposure - diffuse scaly erythematous macules with underlying dyspigmentation - Recommend daily broad spectrum sunscreen SPF 30+ to sun-exposed areas, reapply every 2 hours as needed.  - Call for new or changing lesions.  Skin cancer screening performed today.  No follow-ups on file.  IMarye Round, CMA, am acting as scribe for Sarina Ser, MD .

## 2021-01-19 NOTE — Patient Instructions (Signed)

## 2021-01-20 ENCOUNTER — Encounter: Payer: Self-pay | Admitting: Dermatology

## 2021-01-22 ENCOUNTER — Telehealth: Payer: Self-pay

## 2021-01-22 NOTE — Telephone Encounter (Signed)
-----   Message from Ralene Bathe, MD sent at 01/20/2021  6:41 PM EST ----- Diagnosis Skin , left lateral supraclavicular PIGMENTED SEBORRHEIC KERATOSIS  Benign keratosis No further treatment needed

## 2021-01-22 NOTE — Telephone Encounter (Signed)
Advised patient of results/hd  

## 2021-08-17 ENCOUNTER — Other Ambulatory Visit: Payer: Self-pay | Admitting: Family Medicine

## 2021-08-17 DIAGNOSIS — M8580 Other specified disorders of bone density and structure, unspecified site: Secondary | ICD-10-CM

## 2021-08-19 ENCOUNTER — Ambulatory Visit: Payer: Medicare PPO | Admitting: Dermatology

## 2021-12-07 ENCOUNTER — Other Ambulatory Visit: Payer: Self-pay

## 2021-12-07 ENCOUNTER — Ambulatory Visit (INDEPENDENT_AMBULATORY_CARE_PROVIDER_SITE_OTHER): Payer: Medicare Other | Admitting: Urology

## 2021-12-07 ENCOUNTER — Encounter: Payer: Self-pay | Admitting: Urology

## 2021-12-07 VITALS — BP 125/65 | HR 66 | Ht 63.0 in | Wt 149.0 lb

## 2021-12-07 DIAGNOSIS — N390 Urinary tract infection, site not specified: Secondary | ICD-10-CM

## 2021-12-07 MED ORDER — TRIMETHOPRIM 100 MG PO TABS: 100.0000 mg | ORAL_TABLET | Freq: Every day | ORAL | 3 refills | Status: DC

## 2021-12-07 NOTE — Progress Notes (Signed)
12/07/2021 1:19 PM   Anne Ross 1941-04-19 [id number]  Referring provider: Sofie Hartigan, MD [place],  [place] [address]  Chief Complaint  Patient presents with   Recurrent UTI    HPI: 81 y.o. female presents for follow-up of recurrent UTI.  On trimethoprim suppression x2 years No UTIs over the last 12 months and only 1 infection in the past 2 years No complaints today Had COVID late November 2022 and still regaining some strength No bothersome LUTS   PMH: Past Medical History:  Diagnosis Date   Basal cell carcinoma    chest    Cancer ([place])    uterine   History of COVID-19    Hypertension     Surgical History: Past Surgical History:  Procedure Laterality Date   ABDOMINAL HYSTERECTOMY     BREAST BIOPSY Left    benign 2 times   BREAST BIOPSY Right    neg   BREAST CYST ASPIRATION Left    neg    Home Medications:  Allergies as of 12/07/2021       Reactions   Cortisone Hives   Other Hives   Rosuvastatin    Other reaction(s): Muscle Pain        Medication List        Accurate as of December 07, 2021  1:19 PM. If you have any questions, ask your nurse or doctor.          aspirin EC 81 MG tablet Take by mouth.   Calcipotriene 0.005 % solution   Calcium Carbonate-Vitamin D 500-125 MG-UNIT Tabs Take by mouth.   cetirizine 10 MG tablet Commonly known as: ZYRTEC Take 10 mg by mouth daily.   chlorhexidine 0.12 % solution Commonly known as: PERIDEX   fluticasone 50 MCG/ACT nasal spray Commonly known as: FLONASE Place into both nostrils daily.   folic acid 528 MCG tablet Commonly known as: FOLVITE Take 400 mcg by mouth daily.   metoprolol succinate 50 MG 24 hr tablet Commonly known as: TOPROL-XL TAKE 1 TABLET(50 MG) BY MOUTH EVERY DAY   pimecrolimus 1 % cream Commonly known as: ELIDEL Apply topically.   pravastatin 10 MG tablet Commonly known as: PRAVACHOL   pyridoxine 100 MG tablet Commonly known as:  B-6 Take 100 mg by mouth daily.   ramipril 5 MG capsule Commonly known as: ALTACE   rosuvastatin 5 MG tablet Commonly known as: CRESTOR Take by mouth.   trimethoprim 100 MG tablet Commonly known as: TRIMPEX Take 1 tablet (100 mg total) by mouth daily.   vitamin B-12 250 MCG tablet Commonly known as: CYANOCOBALAMIN Take 250 mcg by mouth daily.   Vitamin D 50 MCG (2000 UT) tablet Take 2,000 Units by mouth daily.        Allergies:  Allergies  Allergen Reactions   Cortisone Hives   Other Hives   Rosuvastatin     Other reaction(s): Muscle Pain    Family History: Family History  Problem Relation Age of Onset   Prostate cancer Maternal Grandfather    Breast cancer Neg Hx     Social History:  reports that she has never smoked. She has never used smokeless tobacco. She reports that she does not drink alcohol and does not use drugs.   Physical Exam: BP 125/65    Pulse 66    Ht 5\' 3"  (1.6 m)    Wt 149 lb (67.6 kg)    BMI 26.39 kg/m   Constitutional:  Alert and oriented,  No acute distress. Psychiatric: Normal mood and affect.  Laboratory Data: UA pending   Assessment & Plan:    1. 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

## 2021-12-09 LAB — URINALYSIS, COMPLETE
Bilirubin, UA: NEGATIVE
Glucose, UA: NEGATIVE
Ketones, UA: NEGATIVE
Leukocytes,UA: NEGATIVE
Nitrite, UA: NEGATIVE
Protein,UA: NEGATIVE
RBC, UA: NEGATIVE
Specific Gravity, UA: <1.005 — ABNORMAL LOW (ref 1.005–1.030)
Urobilinogen, Ur: 0.2 mg/dL (ref 0.2–1.0)
pH, UA: 5.5 (ref 5.0–7.5)

## 2021-12-09 LAB — MICROSCOPIC EXAMINATION
Bacteria, UA: NONE SEEN
Epithelial Cells (non renal): NONE SEEN /hpf (ref 0–10)
RBC, Urine: NONE SEEN /hpf (ref 0–2)
WBC, UA: NONE SEEN /hpf (ref 0–5)

## 2021-12-24 ENCOUNTER — Other Ambulatory Visit: Payer: Self-pay | Admitting: Urology

## 2022-01-18 ENCOUNTER — Ambulatory Visit: Payer: Medicare PPO | Admitting: Dermatology

## 2022-02-04 ENCOUNTER — Ambulatory Visit (INDEPENDENT_AMBULATORY_CARE_PROVIDER_SITE_OTHER): Payer: Medicare Other | Admitting: Dermatology

## 2022-02-04 ENCOUNTER — Other Ambulatory Visit: Payer: Self-pay

## 2022-02-04 DIAGNOSIS — D492 Neoplasm of unspecified behavior of bone, soft tissue, and skin: Secondary | ICD-10-CM | POA: Diagnosis not present

## 2022-02-04 DIAGNOSIS — T7849XD Other allergy, subsequent encounter: Secondary | ICD-10-CM | POA: Diagnosis not present

## 2022-02-04 DIAGNOSIS — Z1283 Encounter for screening for malignant neoplasm of skin: Secondary | ICD-10-CM | POA: Diagnosis not present

## 2022-02-04 DIAGNOSIS — L820 Inflamed seborrheic keratosis: Secondary | ICD-10-CM | POA: Diagnosis not present

## 2022-02-04 DIAGNOSIS — L661 Lichen planopilaris: Secondary | ICD-10-CM

## 2022-02-04 MED ORDER — MINOXIDIL 2.5 MG PO TABS: ORAL_TABLET | ORAL | 2 refills | Status: DC

## 2022-02-04 NOTE — Patient Instructions (Signed)

## 2022-02-04 NOTE — Progress Notes (Unsigned)
Follow-Up Visit   Subjective  Anne Ross is a 81 y.o. female who presents for the following: Annual Exam (Mole check ). Check her scalp hx of Alopecia, scalp getting worse, more hair loss noticed, pt has allergies to steroids topicals and injections.  The patient presents for Total-Body Skin Exam (TBSE) for skin cancer screening and mole check.  The patient has spots, moles and lesions to be evaluated, some may be new or changing and the patient has concerns that these could be cancer. Hx of BCC on her chest. Check a  sensitive spot on the right cheek that will not go away.   The following portions of the chart were reviewed this encounter and updated as appropriate:       Review of Systems:  No other skin or systemic complaints except as noted in HPI or Assessment and Plan.  Objective  Well appearing patient in no apparent distress; mood and affect are within normal limits.  A full examination was performed including scalp, head, eyes, ears, nose, lips, neck, chest, axillae, abdomen, back, buttocks, bilateral upper extremities, bilateral lower extremities, hands, feet, fingers, toes, fingernails, and toenails. All findings within normal limits unless otherwise noted below.  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 Lois Huxley in the past and Dr. Leonie Green had no other recommendations for treatment. I discussed today option of using oral dutasteride or oral finasteride.     Reviewed notes from Dr. Lois Huxley - [facility]. today. Her plan is as follows: 1. Discussed diagnoses and treatment options. 2. The patient deferred all treatments discussed, including: Lasergenesis,  hair restoration, oral finasteride because she does not want a treatment unless it will grow her hair back. Discussed why this is not possible at this time.    Drug allergy - Patient is allergic to steroids. She has a steroid allergy so she is not using a topical steroid For her FFA.  Wt 146 BP 125/69 Pulse 56  Start Minoxidil 2.5 mg take 1/2 tablet daily   Related Medications minoxidil (LONITEN) 2.5 MG tablet Take 1/2 tablet daily  Neoplasm of skin right cheek zygoma  Inflamed seborrheic keratosis right preauricular x 1  Reassured benign age-related growth.  Recommend observation.  Discussed cryotherapy if spot(s) become irritated or inflamed.   Destruction of lesion - right preauricular x 1 Complexity: simple   Destruction method: cryotherapy   Informed consent: discussed and consent obtained   Timeout:  patient name, date of birth, surgical site, and procedure verified Lesion destroyed using liquid nitrogen: Yes   Region frozen until ice ball extended beyond lesion: Yes   Outcome: patient tolerated procedure well with no complications   Post-procedure details: wound care instructions given     Lentigines - Scattered tan macules - Due to sun exposure - Benign-appearing, observe - Recommend daily broad spectrum sunscreen SPF 30+ to sun-exposed areas, reapply every 2 hours as needed. - Call for any changes  Seborrheic Keratoses - Stuck-on, waxy, tan-brown papules and/or plaques  - Benign-appearing - Discussed benign etiology and prognosis. - Observe - Call for any changes  Melanocytic Nevi - Tan-brown and/or  pink-flesh-colored symmetric macules and papules - Benign appearing on exam today - Observation - Call clinic for new or changing moles - Recommend daily use of broad spectrum spf 30+ sunscreen to sun-exposed areas.   Hemangiomas - Red papules - Discussed benign nature - Observe - Call for any changes  Actinic Damage - Chronic condition, secondary to  cumulative UV/sun exposure - diffuse scaly erythematous macules with underlying dyspigmentation - Recommend daily broad spectrum sunscreen SPF 30+ to sun-exposed areas, reapply every 2 hours as needed.  - Staying in the shade or wearing long sleeves, sun glasses (UVA+UVB protection) and wide brim hats (4-inch brim around the entire circumference of the hat) are also recommended for sun protection.  - Call for new or changing lesions.  History of Basal Cell Carcinoma of the Skin chest - No evidence of recurrence today - Recommend regular full body skin exams - Recommend daily broad spectrum sunscreen SPF 30+ to sun-exposed areas, reapply every 2 hours as needed.  - Call if any new or changing lesions are noted between office visits   Skin cancer screening performed today.   Return in about 4 months (around 06/06/2022) for Alopecia .  IMarye Round, CMA, am acting as scribe for Sarina Ser, MD .

## 2022-02-10 ENCOUNTER — Encounter: Payer: Self-pay | Admitting: Dermatology

## 2022-06-10 ENCOUNTER — Ambulatory Visit (INDEPENDENT_AMBULATORY_CARE_PROVIDER_SITE_OTHER): Payer: Medicare Other | Admitting: Dermatology

## 2022-06-10 DIAGNOSIS — L820 Inflamed seborrheic keratosis: Secondary | ICD-10-CM | POA: Diagnosis not present

## 2022-06-10 DIAGNOSIS — L821 Other seborrheic keratosis: Secondary | ICD-10-CM | POA: Diagnosis not present

## 2022-06-10 DIAGNOSIS — L661 Lichen planopilaris: Secondary | ICD-10-CM | POA: Diagnosis not present

## 2022-06-10 NOTE — Progress Notes (Signed)
   Follow-Up Visit   Subjective  Anne Ross is a 81 y.o. female who presents for the following: Frontal fibrosing alopecia/lichen plano pilaris (Scalp, 52mf/u, Minoxidil 2.'5mg'$  1/2 po qd, pt has not noticed new hair in frontal scalp but thicker where she has hair, but she has noticed hair growth in eyebrows) and check spot (L Upper lip, 1.544mno symptoms).  The following portions of the chart were reviewed this encounter and updated as appropriate:   Tobacco  Allergies  Meds  Problems  Med Hx  Surg Hx  Fam Hx     Review of Systems:  No other skin or systemic complaints except as noted in HPI or Assessment and Plan.  Objective  Well appearing patient in no apparent distress; mood and affect are within normal limits.  A focused examination was performed including scalp. Relevant physical exam findings are noted in the Assessment and Plan.  Scalp Hair on scalp is thicker and more growth in eyebrows, no ankle swelling  Left Upper Lip x 1 Stuck on waxy paps with erythema   Assessment & Plan   Seborrheic Keratoses - Stuck-on, waxy, tan-brown papules and/or plaques  - Benign-appearing - Discussed benign etiology and prognosis. - Observe - Call for any changes  Frontal fibrosing alopecia Scalp Chronic and persistent condition with duration or expected duration over one year. Condition is symptomatic / bothersome to patient. Not to goal. Frontal fibrosing alopecia (FFA) is a chronic/progressive and irreversible patterned form of scarring alopecia that presents as a symmetric band-like zone of hair loss along the anterior hairline. The eyebrows are often thinned or absent. It is considered a variant of lichen planopilaris in a patterned distribution and is more common in women. Therapies may stop or slow progression but generally do not lead to hair regrowth.   Pt has been seen by Dr. AmLois Huxleyn the past. Pt is allergic to steroids, so she is not using a topical  steroid for FFA  Wt 146 lb BP 142/66 Pulse 66  Discussed continuing Minoxidil, pt declines continuing. She has decided she will not treat her FFA / LPP at all.  Inflamed seborrheic keratosis Left Upper Lip x 1 Symptomatic, irritating, patient would like treated.  Destruction of lesion - Left Upper Lip x 1 Complexity: simple   Destruction method: cryotherapy   Informed consent: discussed and consent obtained   Timeout:  patient name, date of birth, surgical site, and procedure verified Lesion destroyed using liquid nitrogen: Yes   Region frozen until ice ball extended beyond lesion: Yes   Outcome: patient tolerated procedure well with no complications   Post-procedure details: wound care instructions given    Return in about 8 months (around 02/09/2023) for TBSE, Hx of BCC, recheck ISK L upper lip.  I, SoOthelia PullingRMA, am acting as scribe for DaSarina SerMD . Documentation: I have reviewed the above documentation for accuracy and completeness, and I agree with the above.  DaSarina SerMD

## 2022-06-10 NOTE — Patient Instructions (Addendum)
Cryotherapy Aftercare  Wash gently with soap and water everyday.   Apply Vaseline and Band-Aid daily until healed.     Due to recent changes in healthcare laws, you may see results of your pathology and/or laboratory studies on MyChart before the doctors have had a chance to review them. We understand that in some cases there may be results that are confusing or concerning to you. Please understand that not all results are received at the same time and often the doctors may need to interpret multiple results in order to provide you with the best plan of care or course of treatment. Therefore, we ask that you please give us 2 business days to thoroughly review all your results before contacting the office for clarification. Should we see a critical lab result, you will be contacted sooner.   If You Need Anything After Your Visit  If you have any questions or concerns for your doctor, please call our main line at [phone] and press option 4 to reach your doctor's medical assistant. If no one answers, please leave a voicemail as directed and we will return your call as soon as possible. Messages left after 4 pm will be answered the following business day.   You may also send us a message via MyChart. We typically respond to MyChart messages within 1-2 business days.  For prescription refills, please ask your pharmacy to contact our office. Our fax number is [phone].  If you have an urgent issue when the clinic is closed that cannot wait until the next business day, you can page your doctor at the number below.    Please note that while we do our best to be available for urgent issues outside of office hours, we are not available 24/7.   If you have an urgent issue and are unable to reach us, you may choose to seek medical care at your doctor's office, retail clinic, urgent care center, or emergency room.  If you have a medical emergency, please immediately call 911 or go to the  emergency department.  Pager Numbers  - Dr. Kowalski: [phone]  - Dr. Moye: [phone]  - Dr. Stewart: [phone]  In the event of inclement weather, please call our main line at [phone] for an update on the status of any delays or closures.  Dermatology Medication Tips: Please keep the boxes that topical medications come in in order to help keep track of the instructions about where and how to use these. Pharmacies typically print the medication instructions only on the boxes and not directly on the medication tubes.   If your medication is too expensive, please contact our office at [phone] option 4 or send us a message through MyChart.   We are unable to tell what your co-pay for medications will be in advance as this is different depending on your insurance coverage. However, we may be able to find a substitute medication at lower cost or fill out paperwork to get insurance to cover a needed medication.   If a prior authorization is required to get your medication covered by your insurance company, please allow us 1-2 business days to complete this process.  Drug prices often vary depending on where the prescription is filled and some pharmacies may offer cheaper prices.  The website www.goodrx.com contains coupons for medications through different pharmacies. The prices here do not account for what the cost may be with help from insurance (it may be cheaper with your insurance), but the website can   give you the price if you did not use any insurance.  - You can print the associated coupon and take it with your prescription to the pharmacy.  - You may also stop by our office during regular business hours and pick up a GoodRx coupon card.  - If you need your prescription sent electronically to a different pharmacy, notify our office through  MyChart or by phone at [phone] option 4.     Si Usted Necesita Algo Despus de Su Visita  Tambin puede  enviarnos un mensaje a travs de MyChart. Por lo general respondemos a los mensajes de MyChart en el transcurso de 1 a 2 das hbiles.  Para renovar recetas, por favor pida a su farmacia que se ponga en contacto con nuestra oficina. Nuestro nmero de fax es el [phone].  Si tiene un asunto urgente cuando la clnica est cerrada y que no puede esperar hasta el siguiente da hbil, puede llamar/localizar a su doctor(a) al nmero que aparece a continuacin.   Por favor, tenga en cuenta que aunque hacemos todo lo posible para estar disponibles para asuntos urgentes fuera del horario de oficina, no estamos disponibles las 24 horas del da, los 7 das de la semana.   Si tiene un problema urgente y no puede comunicarse con nosotros, puede optar por buscar atencin mdica  en el consultorio de su doctor(a), en una clnica privada, en un centro de atencin urgente o en una sala de emergencias.  Si tiene una emergencia mdica, por favor llame inmediatamente al 911 o vaya a la sala de emergencias.  Nmeros de bper  - Dr. Kowalski: [phone]  - Dra. Moye: [phone]  - Dra. Stewart: [phone]  En caso de inclemencias del tiempo, por favor llame a nuestra lnea principal al [phone] para una actualizacin sobre el estado de cualquier retraso o cierre.  Consejos para la medicacin en dermatologa: Por favor, guarde las cajas en las que vienen los medicamentos de uso tpico para ayudarle a seguir las instrucciones sobre dnde y cmo usarlos. Las farmacias generalmente imprimen las instrucciones del medicamento slo en las cajas y no directamente en los tubos del medicamento.   Si su medicamento es muy caro, por favor, pngase en contacto con nuestra oficina llamando al [phone] y presione la opcin 4 o envenos un mensaje a travs de MyChart.   No podemos decirle cul ser su copago por los medicamentos por adelantado ya que esto es diferente dependiendo de la cobertura de su seguro.  Sin embargo, es posible que podamos encontrar un medicamento sustituto a menor costo o llenar un formulario para que el seguro cubra el medicamento que se considera necesario.   Si se requiere una autorizacin previa para que su compaa de seguros cubra su medicamento, por favor permtanos de 1 a 2 das hbiles para completar este proceso.  Los precios de los medicamentos varan con frecuencia dependiendo del lugar de dnde se surte la receta y alguna farmacias pueden ofrecer precios ms baratos.  El sitio web www.goodrx.com tiene cupones para medicamentos de diferentes farmacias. Los precios aqu no tienen en cuenta lo que podra costar con la ayuda del seguro (puede ser ms barato con su seguro), pero el sitio web puede darle el precio si no utiliz ningn seguro.  - Puede imprimir el cupn correspondiente y llevarlo con su receta a la farmacia.  - Tambin puede pasar por nuestra oficina durante el horario de atencin regular y recoger una tarjeta de cupones de GoodRx.  -   Si necesita que su receta se enve electrnicamente a una farmacia diferente, informe a nuestra oficina a travs de MyChart de [facility] o por telfono llamando al [phone] y presione la opcin 4.  

## 2022-06-22 ENCOUNTER — Encounter: Payer: Self-pay | Admitting: Dermatology

## 2022-07-25 ENCOUNTER — Other Ambulatory Visit: Payer: Self-pay | Admitting: Dermatology

## 2022-07-25 DIAGNOSIS — L661 Lichen planopilaris: Secondary | ICD-10-CM

## 2022-12-07 ENCOUNTER — Other Ambulatory Visit: Payer: Self-pay | Admitting: Family Medicine

## 2022-12-07 DIAGNOSIS — R079 Chest pain, unspecified: Secondary | ICD-10-CM

## 2022-12-08 ENCOUNTER — Encounter: Payer: Self-pay | Admitting: Urology

## 2022-12-08 ENCOUNTER — Ambulatory Visit (INDEPENDENT_AMBULATORY_CARE_PROVIDER_SITE_OTHER): Payer: Medicare Other | Admitting: Urology

## 2022-12-08 VITALS — BP 132/70 | HR 76 | Ht 64.0 in | Wt 145.0 lb

## 2022-12-08 DIAGNOSIS — Z87440 Personal history of urinary (tract) infections: Secondary | ICD-10-CM | POA: Diagnosis not present

## 2022-12-08 DIAGNOSIS — N390 Urinary tract infection, site not specified: Secondary | ICD-10-CM

## 2022-12-08 LAB — URINALYSIS, COMPLETE
Bilirubin, UA: NEGATIVE
Glucose, UA: NEGATIVE
Ketones, UA: NEGATIVE
Leukocytes,UA: NEGATIVE
Nitrite, UA: NEGATIVE
Protein,UA: NEGATIVE
RBC, UA: NEGATIVE
Specific Gravity, UA: <1.005 — ABNORMAL LOW (ref 1.005–1.030)
Urobilinogen, Ur: 0.2 mg/dL (ref 0.2–1.0)
pH, UA: 5.5 (ref 5.0–7.5)

## 2022-12-08 LAB — MICROSCOPIC EXAMINATION: Bacteria, UA: NONE SEEN

## 2022-12-08 MED ORDER — TRIMETHOPRIM 100 MG PO TABS: 100.0000 mg | ORAL_TABLET | Freq: Every day | ORAL | 3 refills | Status: DC

## 2022-12-08 NOTE — Progress Notes (Signed)
12/08/2022 1:02 PM   Anne Ross 28-Sep-1941 [id number]  Referring provider: Sofie Hartigan, MD [place],  [place] [address]  Chief Complaint  Patient presents with   Recurrent UTI    HPI: 82 y.o. female presents for annual follow-up of recurrent UTI.  On trimethoprim suppression x3 years No UTIs over the last 24 months No complaints today No bothersome LUTS   PMH: Past Medical History:  Diagnosis Date   Basal cell carcinoma    chest    Cancer ([place])    uterine   History of COVID-19    Hypertension     Surgical History: Past Surgical History:  Procedure Laterality Date   ABDOMINAL HYSTERECTOMY     BREAST BIOPSY Left    benign 2 times   BREAST BIOPSY Right    neg   BREAST CYST ASPIRATION Left    neg    Home Medications:  Allergies as of 12/08/2022       Reactions   Cortisone Hives   Other Hives   Rosuvastatin    Other reaction(s): Muscle Pain        Medication List        Accurate as of December 08, 2022  1:02 PM. If you have any questions, ask your nurse or doctor.          STOP taking these medications    ramipril 5 MG capsule Commonly known as: ALTACE       TAKE these medications    aspirin EC 81 MG tablet Take by mouth.   Calcipotriene 0.005 % solution   Calcium Carbonate-Vitamin D 500-125 MG-UNIT Tabs Take by mouth.   cetirizine 10 MG tablet Commonly known as: ZYRTEC Take 10 mg by mouth daily.   chlorhexidine 0.12 % solution Commonly known as: PERIDEX   cyanocobalamin 250 MCG tablet Commonly known as: VITAMIN B12 Take 250 mcg by mouth daily.   fluticasone 50 MCG/ACT nasal spray Commonly known as: FLONASE Place into both nostrils daily.   folic acid 417 MCG tablet Commonly known as: FOLVITE Take 400 mcg by mouth daily.   metoprolol succinate 50 MG 24 hr tablet Commonly known as: TOPROL-XL TAKE 1 TABLET(50 MG) BY MOUTH EVERY DAY   minoxidil 2.5 MG tablet Commonly known as:  LONITEN Take 1/2 tablet daily   pimecrolimus 1 % cream Commonly known as: ELIDEL Apply topically.   pravastatin 10 MG tablet Commonly known as: PRAVACHOL   pyridoxine 100 MG tablet Commonly known as: B-6 Take 100 mg by mouth daily.   rosuvastatin 5 MG tablet Commonly known as: CRESTOR Take by mouth.   trimethoprim 100 MG tablet Commonly known as: TRIMPEX Take 1 tablet (100 mg total) by mouth daily.   Vitamin D 50 MCG (2000 UT) tablet Take 2,000 Units by mouth daily.        Allergies:  Allergies  Allergen Reactions   Cortisone Hives   Other Hives   Rosuvastatin     Other reaction(s): Muscle Pain    Family History: Family History  Problem Relation Age of Onset   Prostate cancer Maternal Grandfather    Breast cancer Neg Hx     Social History:  reports that she has never smoked. She has never used smokeless tobacco. She reports that she does not drink alcohol and does not use drugs.   Physical Exam: There were no vitals taken for this visit.  Constitutional:  Alert and oriented, No acute distress. Psychiatric: Normal mood and affect.  Laboratory Data: UA: Dipstick/microscopy negative   Assessment & Plan:    1. 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

## 2022-12-15 ENCOUNTER — Ambulatory Visit
Admission: RE | Admit: 2022-12-15 | Discharge: 2022-12-15 | Disposition: A | Discharge status: Home / Self Care | Payer: Medicare Other | Source: Ambulatory Visit | Attending: Family Medicine | Admitting: Family Medicine

## 2022-12-15 DIAGNOSIS — I214 Non-ST elevation (NSTEMI) myocardial infarction: Secondary | ICD-10-CM | POA: Diagnosis not present

## 2022-12-15 DIAGNOSIS — R079 Chest pain, unspecified: Secondary | ICD-10-CM | POA: Insufficient documentation

## 2022-12-16 ENCOUNTER — Inpatient Hospital Stay
Admission: EM | Admit: 2022-12-16 | Discharge: 2022-12-17 | DRG: 322 | Disposition: A | Discharge status: Other Acute Inpatient Hospital | Payer: Medicare Other | Attending: Hospitalist | Admitting: Hospitalist

## 2022-12-16 DIAGNOSIS — I25110 Atherosclerotic heart disease of native coronary artery with unstable angina pectoris: Secondary | ICD-10-CM | POA: Diagnosis present

## 2022-12-16 DIAGNOSIS — I200 Unstable angina: Principal | ICD-10-CM

## 2022-12-16 DIAGNOSIS — I214 Non-ST elevation (NSTEMI) myocardial infarction: Principal | ICD-10-CM | POA: Diagnosis present

## 2022-12-16 DIAGNOSIS — Z792 Long term (current) use of antibiotics: Secondary | ICD-10-CM

## 2022-12-16 DIAGNOSIS — Z8616 Personal history of COVID-19: Secondary | ICD-10-CM

## 2022-12-16 DIAGNOSIS — Z85828 Personal history of other malignant neoplasm of skin: Secondary | ICD-10-CM

## 2022-12-16 DIAGNOSIS — R079 Chest pain, unspecified: Secondary | ICD-10-CM | POA: Diagnosis present

## 2022-12-16 DIAGNOSIS — Z79899 Other long term (current) drug therapy: Secondary | ICD-10-CM

## 2022-12-16 DIAGNOSIS — N390 Urinary tract infection, site not specified: Secondary | ICD-10-CM | POA: Diagnosis not present

## 2022-12-16 DIAGNOSIS — Z87440 Personal history of urinary (tract) infections: Secondary | ICD-10-CM

## 2022-12-16 DIAGNOSIS — Z8042 Family history of malignant neoplasm of prostate: Secondary | ICD-10-CM

## 2022-12-16 DIAGNOSIS — Z888 Allergy status to other drugs, medicaments and biological substances status: Secondary | ICD-10-CM

## 2022-12-16 DIAGNOSIS — I10 Essential (primary) hypertension: Secondary | ICD-10-CM | POA: Diagnosis present

## 2022-12-16 DIAGNOSIS — Z7982 Long term (current) use of aspirin: Secondary | ICD-10-CM

## 2022-12-16 LAB — COMPREHENSIVE METABOLIC PANEL
ALT: 50 U/L — ABNORMAL HIGH (ref 0–44)
AST: 58 U/L — ABNORMAL HIGH (ref 15–41)
Albumin: 4.3 g/dL (ref 3.5–5.0)
Alkaline Phosphatase: 41 U/L (ref 38–126)
Anion gap: 10 (ref 5–15)
BUN: 20 mg/dL (ref 8–23)
CO2: 18 mmol/L — ABNORMAL LOW (ref 22–32)
Calcium: 9.3 mg/dL (ref 8.9–10.3)
Chloride: 108 mmol/L (ref 98–111)
Creatinine, Ser: 1.09 mg/dL — ABNORMAL HIGH (ref 0.44–1.00)
GFR, Estimated: 51 mL/min — ABNORMAL LOW (ref >60)
Glucose, Bld: 134 mg/dL — ABNORMAL HIGH (ref 70–99)
Potassium: 4.4 mmol/L (ref 3.5–5.1)
Sodium: 136 mmol/L (ref 135–145)
Total Bilirubin: 0.6 mg/dL (ref 0.3–1.2)
Total Protein: 7.8 g/dL (ref 6.5–8.1)

## 2022-12-16 LAB — CBC WITH DIFFERENTIAL/PLATELET
Abs Immature Granulocytes: 0.09 10*3/uL — ABNORMAL HIGH (ref 0.00–0.07)
Basophils Absolute: 0.1 10*3/uL (ref 0.0–0.1)
Basophils Relative: 1 %
Eosinophils Absolute: 0.2 10*3/uL (ref 0.0–0.5)
Eosinophils Relative: 3 %
HCT: 37.7 % (ref 36.0–46.0)
Hemoglobin: 12.1 g/dL (ref 12.0–15.0)
Immature Granulocytes: 1 %
Lymphocytes Relative: 32 %
Lymphs Abs: 2.7 10*3/uL (ref 0.7–4.0)
MCH: 28.3 pg (ref 26.0–34.0)
MCHC: 32.1 g/dL (ref 30.0–36.0)
MCV: 88.1 fL (ref 80.0–100.0)
Monocytes Absolute: 0.7 10*3/uL (ref 0.1–1.0)
Monocytes Relative: 9 %
Neutro Abs: 4.7 10*3/uL (ref 1.7–7.7)
Neutrophils Relative %: 54 %
Platelets: 154 10*3/uL (ref 150–400)
RBC: 4.28 MIL/uL (ref 3.87–5.11)
RDW: 12.9 % (ref 11.5–15.5)
WBC: 8.5 10*3/uL (ref 4.0–10.5)
nRBC: 0 % (ref 0.0–0.2)

## 2022-12-16 LAB — APTT: aPTT: 31 seconds (ref 24–36)

## 2022-12-16 LAB — PROTIME-INR
INR: 1.2 (ref 0.8–1.2)
Prothrombin Time: 14.7 seconds (ref 11.4–15.2)

## 2022-12-16 LAB — TROPONIN I (HIGH SENSITIVITY): Troponin I (High Sensitivity): 37 ng/L — ABNORMAL HIGH (ref <18)

## 2022-12-16 LAB — LIPASE, BLOOD: Lipase: 53 U/L — ABNORMAL HIGH (ref 11–51)

## 2022-12-16 MED ORDER — HEPARIN (PORCINE) 25000 UT/250ML-% IV SOLN
1000.0000 [IU]/h | INTRAVENOUS | Status: DC
  Administered 2022-12-16: 800 [IU]/h via INTRAVENOUS
  Filled 2022-12-16: qty 250

## 2022-12-16 MED ORDER — NITROGLYCERIN 0.4 MG SL SUBL: 0.4000 mg | SUBLINGUAL_TABLET | SUBLINGUAL | Status: DC | PRN

## 2022-12-16 MED ORDER — HEPARIN BOLUS VIA INFUSION
3900.0000 [IU] | Freq: Once | INTRAVENOUS | Status: AC
  Administered 2022-12-16: 3900 [IU] via INTRAVENOUS
  Filled 2022-12-16: qty 3900

## 2022-12-16 NOTE — ED Triage Notes (Signed)
Pt brought in b y EMS w c/o SOB, Chest Pain radiating up to jaw. Pt was diphoretic and pale per EMS. Pt was given 1 spray of nitro and took 5 baby asa. On arrival to room 12 pt reports just heaviness but no pain. A&Ox4

## 2022-12-16 NOTE — ED Provider Notes (Signed)
[name] Provider Note    Event Date/Time   First MD Initiated Contact with Patient 12/16/22 2229     (approximate)   History   Chief Complaint: Chest Pain   HPI  Anne Ross is a 82 y.o. female with a history of hypertension who comes ED complaining of chest pain described as pressure, 8 out of 10, starting earlier today and constant all day, associate with diaphoresis, radiating to jaw and bilateral arms, shortness of breath.  Worse with exertion, better with rest, and has been having similar episodes over the past 2 weeks that have been gradually more severe and more easily onset.  Today similar symptoms started and have been unremitting.  EMS gave nitroglycerin spray which improved pain to 3/10.  Patient took 5 baby aspirin at home prior to EMS arrival.     Physical Exam   Triage Vital Signs: ED Triage Vitals  Enc Vitals Group     BP 12/16/22 2235 (!) 189/75     Pulse Rate 12/16/22 2235 76     Resp 12/16/22 2235 18     Temp 12/16/22 2235 98.2 F (36.8 C)     Temp Source 12/16/22 2235 Oral     SpO2 12/16/22 2235 99 %     Weight 12/16/22 2238 144 lb 15.9 oz (65.8 kg)     Height 12/16/22 2238 '5\' 4"'$  (1.626 m)     Head Circumference --      Peak Flow --      Pain Score 12/16/22 2238 0     Pain Loc --      Pain Edu? --      Excl. in [place]? --     Most recent vital signs: Vitals:   12/16/22 2330 12/17/22 0000  BP: (!) 157/55 (!) 152/60  Pulse: 64 62  Resp: 18 15  Temp:    SpO2: 98%     General: Awake, no distress.  CV:  Good peripheral perfusion.  Regular rate and rhythm.  Symmetric distal pulses Resp:  Normal effort.  Clear to auscultation bilaterally Abd:  No distention.  Soft nontender Other:  Normal mental status   ED Results / Procedures / Treatments   Labs (all labs ordered are listed, but only abnormal results are displayed) Labs Reviewed  COMPREHENSIVE METABOLIC PANEL - Abnormal; Notable for the following  components:      Result Value   CO2 18 (*)    Glucose, Bld 134 (*)    Creatinine, Ser 1.09 (*)    AST 58 (*)    ALT 50 (*)    GFR, Estimated 51 (*)    All other components within normal limits  LIPASE, BLOOD - Abnormal; Notable for the following components:   Lipase 53 (*)    All other components within normal limits  CBC WITH DIFFERENTIAL/PLATELET - Abnormal; Notable for the following components:   Abs Immature Granulocytes 0.09 (*)    All other components within normal limits  TROPONIN I (HIGH SENSITIVITY) - Abnormal; Notable for the following components:   Troponin I (High Sensitivity) 37 (*)    All other components within normal limits  PROTIME-INR  APTT  HEPARIN LEVEL (UNFRACTIONATED)  BRAIN NATRIURETIC PEPTIDE  TROPONIN I (HIGH SENSITIVITY)     EKG Interpreted by me Sinus rhythm rate of 72.  Normal axis, normal intervals.  Normal QRS.  Slight ST depression in inferior and lateral leads, no ST elevation, normal T waves.   RADIOLOGY CT chest from  yesterday interpreted by me, negative for mass or infiltrate.  Radiology report reviewed.   PROCEDURES:  .Critical Care  Performed by: Carrie Mew, MD Authorized by: Carrie Mew, MD   Critical care provider statement:    Critical care time (minutes):  35   Critical care time was exclusive of:  Separately billable procedures and treating other patients   Critical care was necessary to treat or prevent imminent or life-threatening deterioration of the following conditions:  Cardiac failure   Critical care was time spent personally by me on the following activities:  Development of treatment plan with patient or surrogate, discussions with consultants, evaluation of patient's response to treatment, examination of patient, obtaining history from patient or surrogate, ordering and performing treatments and interventions, ordering and review of laboratory studies, ordering and review of radiographic studies, pulse  oximetry, re-evaluation of patient's condition and review of old charts   Care discussed with: admitting provider      MEDICATIONS ORDERED IN ED: Medications  nitroGLYCERIN (NITROSTAT) SL tablet 0.4 mg (has no administration in time range)  heparin ADULT infusion 100 units/mL (25000 units/230m) (800 Units/hr Intravenous New Bag/Given 12/16/22 2336)  insulin aspart (novoLOG) injection 0-15 Units (has no administration in time range)  acetaminophen (TYLENOL) tablet 650 mg (has no administration in time range)  ondansetron (ZOFRAN) injection 4 mg (has no administration in time range)  0.9 %  sodium chloride infusion (has no administration in time range)  heparin bolus via infusion 3,900 Units (3,900 Units Intravenous Bolus from Bag 12/16/22 2336)     IMPRESSION / [place] / [place]/ ED COURSE  I reviewed the triage vital signs and the nursing notes.                              Differential diagnosis includes, but is not limited to, unstable angina/non-STEMI, anemia, electrolyte abnormality, AKI  Patient's presentation is most consistent with acute presentation with potential threat to life or bodily function.  Patient presents with escalating exertional chest pain, highly suspicious for ACS.  EKG is nondiagnostic.  Vital signs are stable.  Will start patient on heparin, continue nitroglycerin with goal of pain resolution.  Will admit for cardiac workup.       FINAL CLINICAL IMPRESSION(S) / ED DIAGNOSES   Final diagnoses:  Unstable angina ([place]     Rx / DC Orders   ED Discharge Orders     None        Note:  This document was prepared using Dragon voice recognition software and may include unintentional dictation errors.   SCarrie Mew MD 12/17/22 [phone]

## 2022-12-16 NOTE — Progress Notes (Signed)
ANTICOAGULATION CONSULT NOTE - Initial Consult  Pharmacy Consult for Heparin  Indication: chest pain/ACS  Allergies  Allergen Reactions   Cortisone Hives   Other Hives   Rosuvastatin     Other reaction(s): Muscle Pain    Patient Measurements: Height: '5\' 4"'$  (162.6 cm) Weight: 65.8 kg (144 lb 15.9 oz) IBW/kg (Calculated) : 54.7 Heparin Dosing Weight: 65.8 kg   Vital Signs: Temp: 98.2 F (36.8 C) (01/18 2235) Temp Source: Oral (01/18 2235) BP: 189/75 (01/18 2235) Pulse Rate: 76 (01/18 2235)  Labs: Recent Labs    12/16/22 2255  HGB 12.1  HCT 37.7  PLT 154    CrCl cannot be calculated (No successful lab value found.).   Medical History: Past Medical History:  Diagnosis Date   Basal cell carcinoma    chest    Cancer [facility])    uterine   History of COVID-19    Hypertension     Medications:  (Not in a hospital admission)   Assessment: Pharmacy consulted to dose heparin in this 82 year old female admitted with ACS/NSTEMI.  No prior anticoag noted. CrCl =   Goal of Therapy:  Heparin level 0.3-0.7 units/ml Monitor platelets by anticoagulation protocol: Yes   Plan:  Give 3900 units bolus x 1 Start heparin infusion at 800 units/hr Check anti-Xa level in 8 hours and daily while on heparin Continue to monitor H&H and platelets  Arminta Gamm D 12/16/2022,11:20 PM

## 2022-12-16 NOTE — ED Notes (Signed)
This RN now assuming care of pt. Pt's husband retrieved from the waiting room and brought to patients room.

## 2022-12-17 ENCOUNTER — Observation Stay: Payer: Medicare Other

## 2022-12-17 ENCOUNTER — Encounter
Admission: EM | Disposition: A | Discharge status: Other Acute Inpatient Hospital | Payer: Self-pay | Source: Home / Self Care | Attending: Hospitalist

## 2022-12-17 ENCOUNTER — Encounter: Payer: Self-pay | Admitting: Internal Medicine

## 2022-12-17 ENCOUNTER — Other Ambulatory Visit: Payer: Self-pay

## 2022-12-17 ENCOUNTER — Observation Stay
Admit: 2022-12-17 | Discharge: 2022-12-17 | Disposition: A | Discharge status: Home / Self Care | Payer: Medicare Other | Attending: Cardiovascular Disease | Admitting: Cardiovascular Disease

## 2022-12-17 DIAGNOSIS — Z8042 Family history of malignant neoplasm of prostate: Secondary | ICD-10-CM | POA: Diagnosis not present

## 2022-12-17 DIAGNOSIS — R072 Precordial pain: Secondary | ICD-10-CM | POA: Diagnosis not present

## 2022-12-17 DIAGNOSIS — Z79899 Other long term (current) drug therapy: Secondary | ICD-10-CM | POA: Diagnosis not present

## 2022-12-17 DIAGNOSIS — I10 Essential (primary) hypertension: Secondary | ICD-10-CM | POA: Diagnosis present

## 2022-12-17 DIAGNOSIS — I214 Non-ST elevation (NSTEMI) myocardial infarction: Secondary | ICD-10-CM | POA: Diagnosis present

## 2022-12-17 DIAGNOSIS — Z792 Long term (current) use of antibiotics: Secondary | ICD-10-CM | POA: Diagnosis not present

## 2022-12-17 DIAGNOSIS — Z85828 Personal history of other malignant neoplasm of skin: Secondary | ICD-10-CM | POA: Diagnosis not present

## 2022-12-17 DIAGNOSIS — Z888 Allergy status to other drugs, medicaments and biological substances status: Secondary | ICD-10-CM | POA: Diagnosis not present

## 2022-12-17 DIAGNOSIS — Z87440 Personal history of urinary (tract) infections: Secondary | ICD-10-CM | POA: Diagnosis not present

## 2022-12-17 DIAGNOSIS — R079 Chest pain, unspecified: Secondary | ICD-10-CM | POA: Diagnosis present

## 2022-12-17 DIAGNOSIS — I25110 Atherosclerotic heart disease of native coronary artery with unstable angina pectoris: Secondary | ICD-10-CM | POA: Diagnosis present

## 2022-12-17 DIAGNOSIS — Z7982 Long term (current) use of aspirin: Secondary | ICD-10-CM | POA: Diagnosis not present

## 2022-12-17 DIAGNOSIS — Z8616 Personal history of COVID-19: Secondary | ICD-10-CM | POA: Diagnosis not present

## 2022-12-17 HISTORY — PX: LEFT HEART CATH AND CORONARY ANGIOGRAPHY: CATH118249

## 2022-12-17 LAB — HEPARIN LEVEL (UNFRACTIONATED): Heparin Unfractionated: 0.16 IU/mL — ABNORMAL LOW (ref 0.30–0.70)

## 2022-12-17 LAB — MAGNESIUM: Magnesium: 1.8 mg/dL (ref 1.7–2.4)

## 2022-12-17 LAB — ECHOCARDIOGRAM COMPLETE
AR max vel: 2.6 cm2
AV Area VTI: 3.08 cm2
AV Area mean vel: 2.57 cm2
AV Mean grad: 2 mmHg
AV Peak grad: 4.2 mmHg
Ao pk vel: 1.03 m/s
Area-P 1/2: 3.15 cm2
Height: 64 in
S' Lateral: 2 cm
Weight: 2319.94 oz

## 2022-12-17 LAB — CBG MONITORING, ED: Glucose-Capillary: 123 mg/dL — ABNORMAL HIGH (ref 70–99)

## 2022-12-17 LAB — PHOSPHORUS: Phosphorus: 3.5 mg/dL (ref 2.5–4.6)

## 2022-12-17 LAB — TROPONIN I (HIGH SENSITIVITY): Troponin I (High Sensitivity): 281 ng/L (ref <18)

## 2022-12-17 LAB — BRAIN NATRIURETIC PEPTIDE: B Natriuretic Peptide: 88.6 pg/mL (ref 0.0–100.0)

## 2022-12-17 SURGERY — LEFT HEART CATH AND CORONARY ANGIOGRAPHY
Anesthesia: Moderate Sedation

## 2022-12-17 MED ORDER — HEPARIN BOLUS VIA INFUSION
2000.0000 [IU] | Freq: Once | INTRAVENOUS | Status: AC
  Administered 2022-12-17: 2000 [IU] via INTRAVENOUS
  Filled 2022-12-17: qty 2000

## 2022-12-17 MED ORDER — MIDAZOLAM HCL 2 MG/2ML IJ SOLN
INTRAMUSCULAR | Status: DC | PRN
  Administered 2022-12-17: 1 mg via INTRAVENOUS

## 2022-12-17 MED ORDER — SODIUM CHLORIDE 0.9% FLUSH: 3.0000 mL | INTRAVENOUS | Status: DC | PRN

## 2022-12-17 MED ORDER — HEPARIN (PORCINE) 25000 UT/250ML-% IV SOLN
1000.0000 [IU]/h | INTRAVENOUS | Status: DC
  Administered 2022-12-17: 1000 [IU]/h via INTRAVENOUS

## 2022-12-17 MED ORDER — HEPARIN (PORCINE) 25000 UT/250ML-% IV SOLN: 1000.0000 [IU]/h | INTRAVENOUS | Status: DC

## 2022-12-17 MED ORDER — HYDRALAZINE HCL 20 MG/ML IJ SOLN: 10.0000 mg | INTRAMUSCULAR | Status: DC | PRN

## 2022-12-17 MED ORDER — SODIUM CHLORIDE 0.9% FLUSH
3.0000 mL | Freq: Two times a day (BID) | INTRAVENOUS | Status: DC
  Administered 2022-12-17: 3 mL via INTRAVENOUS

## 2022-12-17 MED ORDER — NITROGLYCERIN IN D5W 200-5 MCG/ML-% IV SOLN
INTRAVENOUS | Status: AC
  Administered 2022-12-17: 5 ug/min via INTRAVENOUS
  Filled 2022-12-17: qty 250

## 2022-12-17 MED ORDER — METOPROLOL SUCCINATE ER 50 MG PO TB24
50.0000 mg | ORAL_TABLET | Freq: Every day | ORAL | Status: DC
  Administered 2022-12-17: 50 mg via ORAL

## 2022-12-17 MED ORDER — NITROGLYCERIN IN D5W 200-5 MCG/ML-% IV SOLN: 0.0000 ug/min | INTRAVENOUS | Status: DC

## 2022-12-17 MED ORDER — HEPARIN (PORCINE) IN NACL 1000-0.9 UT/500ML-% IV SOLN
INTRAVENOUS | Status: DC | PRN
  Administered 2022-12-17 (×3): 500 mL

## 2022-12-17 MED ORDER — SODIUM CHLORIDE 0.9 % IV SOLN: INTRAVENOUS | Status: DC

## 2022-12-17 MED ORDER — IOHEXOL 300 MG/ML  SOLN
INTRAMUSCULAR | Status: DC | PRN
  Administered 2022-12-17: 72 mL

## 2022-12-17 MED ORDER — TRIMETHOPRIM 100 MG PO TABS
100.0000 mg | ORAL_TABLET | Freq: Every day | ORAL | Status: DC
  Administered 2022-12-17: 100 mg via ORAL
  Filled 2022-12-17: qty 1

## 2022-12-17 MED ORDER — SODIUM CHLORIDE 0.9% FLUSH: 3.0000 mL | Freq: Two times a day (BID) | INTRAVENOUS | Status: DC

## 2022-12-17 MED ORDER — ONDANSETRON HCL 4 MG/2ML IJ SOLN: 4.0000 mg | Freq: Four times a day (QID) | INTRAMUSCULAR | Status: DC | PRN

## 2022-12-17 MED ORDER — PRAVASTATIN SODIUM 20 MG PO TABS: 10.0000 mg | ORAL_TABLET | Freq: Every day | ORAL | Status: DC

## 2022-12-17 MED ORDER — SODIUM CHLORIDE 0.9 % WEIGHT BASED INFUSION: 1.0000 mL/kg/h | INTRAVENOUS | Status: DC

## 2022-12-17 MED ORDER — ACETAMINOPHEN 325 MG PO TABS: 650.0000 mg | ORAL_TABLET | ORAL | Status: DC | PRN

## 2022-12-17 MED ORDER — FENTANYL CITRATE (PF) 100 MCG/2ML IJ SOLN
INTRAMUSCULAR | Status: AC
  Filled 2022-12-17: qty 2

## 2022-12-17 MED ORDER — INSULIN ASPART 100 UNIT/ML IJ SOLN: 0.0000 [IU] | Freq: Three times a day (TID) | INTRAMUSCULAR | Status: DC

## 2022-12-17 MED ORDER — HYDRALAZINE HCL 20 MG/ML IJ SOLN: 10.0000 mg | Freq: Four times a day (QID) | INTRAMUSCULAR | Status: DC | PRN

## 2022-12-17 MED ORDER — LABETALOL HCL 5 MG/ML IV SOLN: 10.0000 mg | INTRAVENOUS | Status: DC | PRN

## 2022-12-17 MED ORDER — ASPIRIN 81 MG PO CHEW
CHEWABLE_TABLET | ORAL | Status: AC
  Filled 2022-12-17: qty 1

## 2022-12-17 MED ORDER — FENTANYL CITRATE (PF) 100 MCG/2ML IJ SOLN
INTRAMUSCULAR | Status: DC | PRN
  Administered 2022-12-17: 50 ug via INTRAVENOUS

## 2022-12-17 MED ORDER — ASPIRIN 81 MG PO TBEC
81.0000 mg | DELAYED_RELEASE_TABLET | Freq: Every day | ORAL | Status: DC
  Administered 2022-12-17: 81 mg via ORAL

## 2022-12-17 MED ORDER — ASPIRIN 81 MG PO CHEW
81.0000 mg | CHEWABLE_TABLET | ORAL | Status: AC
  Administered 2022-12-17: 81 mg via ORAL

## 2022-12-17 MED ORDER — MIDAZOLAM HCL 2 MG/2ML IJ SOLN
INTRAMUSCULAR | Status: AC
  Filled 2022-12-17: qty 2

## 2022-12-17 MED ORDER — SODIUM CHLORIDE 0.9 % IV SOLN: 250.0000 mL | INTRAVENOUS | Status: DC | PRN

## 2022-12-17 SURGICAL SUPPLY — 11 items
CATH INFINITI 5 FR 3DRC (CATHETERS) ×1 IMPLANT
CATH INFINITI 5FR MULTPACK ANG (CATHETERS) ×1 IMPLANT
DEVICE CLOSURE MYNXGRIP 5F (Vascular Products) ×1 IMPLANT
NDL PERC 18GX7CM (NEEDLE) IMPLANT
NEEDLE PERC 18GX7CM (NEEDLE) ×2 IMPLANT
PACK CARDIAC CATH (CUSTOM PROCEDURE TRAY) ×2 IMPLANT
PROTECTION STATION PRESSURIZED (MISCELLANEOUS) ×2
SET ATX SIMPLICITY (MISCELLANEOUS) ×1 IMPLANT
SHEATH AVANTI 5FR X 11CM (SHEATH) ×1 IMPLANT
STATION PROTECTION PRESSURIZED (MISCELLANEOUS) ×1 IMPLANT
WIRE GUIDERIGHT .035X150 (WIRE) ×1 IMPLANT

## 2022-12-17 NOTE — Progress Notes (Signed)
Called and spoke with [facility] from [facility] transfer center to check wait time for pt. PT is accepted and awaiting on room assignment.  Called [facility] about transport and was told that no truck available until at least 7P. Made Matt from [facility] transfer center aware of that who will also attempt to work on transport.  Primary RN Marcie Bal aware.

## 2022-12-17 NOTE — ED Notes (Signed)
Patient complaining of chest pain, shortness of breath and numbness of arms at this time. Cardiology in room previously and aware. This RN obtained an EKG and special procedures nurse coming to bedside to take to cath lab.

## 2022-12-17 NOTE — Hospital Course (Signed)
Pt is a/o:chest tightness stated in December 2023.  Sitting on sofa had chest tightness.  Duration:from 9 pm to receiving NTG by EMS.  Frequency:since December 2023 it has been intermittent and it goes away.   Location:chest pain  middle   Quality:pressure / painful and jaws are hurting and chest is tight.   Rate: 9-/10/10.  Radiation:to jaw and arms feel funny.   Aggravating:N/A  Alleviating:N/A  Associated factors:SOB/   Covid x 2 in past two years.

## 2022-12-17 NOTE — Progress Notes (Signed)
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 Normal LVEF 69%, and normal apical wall motion. May be transferred today hopefully.

## 2022-12-17 NOTE — H&P (Signed)
History and Physical    Chief Complaint: Chest pain.   HISTORY OF PRESENT ILLNESS: Anne Ross is an 82 y.o. female  seen for chest pain.  Pt is a/o:chest tightness stated in December 2023.  Sitting on sofa had chest tightness.  Duration:from 9 pm to receiving NTG by EMS.  Frequency:since December 2023 it has been intermittent and it goes away.   Location:chest pain  middle   Quality:pressure / painful and jaws are hurting and chest is tight.   Rate: 9-/10/10.  Radiation:to jaw and arms feel funny.   Aggravating:N/A  Alleviating:N/A  Associated factors:SOB/   Covid x 2 in past two years.   Pt has  Past Medical History:  Diagnosis Date   Basal cell carcinoma    chest    Cancer [facility])    uterine   History of COVID-19    Hypertension      Review of Systems  Respiratory:  Positive for shortness of breath.   Cardiovascular:  Positive for chest pain.  All other systems reviewed and are negative.  Allergies  Allergen Reactions   Cortisone Hives   Other Hives   Rosuvastatin     Other reaction(s): Muscle Pain   Past Surgical History:  Procedure Laterality Date   ABDOMINAL HYSTERECTOMY     BREAST BIOPSY Left    benign 2 times   BREAST BIOPSY Right    neg   BREAST CYST ASPIRATION Left    neg       MEDICATIONS: Current Outpatient Medications  Medication Instructions   aspirin EC 81 MG tablet Oral   Calcipotriene 0.005 % solution No dose, route, or frequency recorded.   Calcium Carbonate-Vitamin D 500-125 MG-UNIT TABS Oral   cetirizine (ZYRTEC) 10 mg, Oral, Daily   chlorhexidine (PERIDEX) 0.12 % solution No dose, route, or frequency recorded.   cyanocobalamin (VITAMIN B12) 250 mcg, Oral, Daily   fluticasone (FLONASE) 50 MCG/ACT nasal spray Each Nare, Daily   folic acid (FOLVITE) 932 mcg, Oral, Daily   metoprolol succinate (TOPROL-XL) 50 MG 24 hr tablet TAKE 1 TABLET(50 MG) BY MOUTH EVERY DAY   minoxidil (LONITEN) 2.5 MG tablet Take 1/2  tablet daily   pimecrolimus (ELIDEL) 1 % cream Topical   pravastatin (PRAVACHOL) 10 MG tablet No dose, route, or frequency recorded.   pyridoxine (B-6) 100 mg, Oral, Daily   rosuvastatin (CRESTOR) 5 MG tablet Oral   trimethoprim (TRIMPEX) 100 mg, Oral, Daily   Vitamin D 2,000 Units, Oral, Daily     aspirin EC  81 mg Oral Daily   insulin aspart  0-15 Units Subcutaneous TID WC   metoprolol succinate  50 mg Oral Daily   pravastatin  10 mg Oral q1800   trimethoprim  100 mg Oral Daily     sodium chloride 100 mL/hr at 12/17/22 0050   heparin 800 Units/hr (12/16/22 2336)    acetaminophen, hydrALAZINE, nitroGLYCERIN, ondansetron (ZOFRAN) IV    ED Course: Pt in Ed is alert/ awake/ and oriented.  Vitals:   12/16/22 2238 12/16/22 2300 12/16/22 2330 12/17/22 0000  BP:  (!) 166/55 (!) 157/55 (!) 152/60  Pulse:  69 64 62  Resp:  (!) '21 18 15  '$ Temp:      TempSrc:      SpO2:  99% 98%   Weight: 65.8 kg     Height: '5\' 4"'$  (1.626 m)      No intake/output data recorded. SpO2: 98 % Blood work in ed shows: Co2 of 18 , mildly  elevated lft's. Lipase at 53 . Results for orders placed or performed during the hospital encounter of 12/16/22 (from the past 24 hour(s))  Comprehensive metabolic panel     Status: Abnormal   Collection Time: 12/16/22 10:55 PM  Result Value Ref Range   Sodium 136 135 - 145 mmol/L   Potassium 4.4 3.5 - 5.1 mmol/L   Chloride 108 98 - 111 mmol/L   CO2 18 (L) 22 - 32 mmol/L   Glucose, Bld 134 (H) 70 - 99 mg/dL   BUN 20 8 - 23 mg/dL   Creatinine, Ser 1.09 (H) 0.44 - 1.00 mg/dL   Calcium 9.3 8.9 - 10.3 mg/dL   Total Protein 7.8 6.5 - 8.1 g/dL   Albumin 4.3 3.5 - 5.0 g/dL   AST 58 (H) 15 - 41 U/L   ALT 50 (H) 0 - 44 U/L   Alkaline Phosphatase 41 38 - 126 U/L   Total Bilirubin 0.6 0.3 - 1.2 mg/dL   GFR, Estimated 51 (L) >60 mL/min   Anion gap 10 5 - 15  Lipase, blood     Status: Abnormal   Collection Time: 12/16/22 10:55 PM  Result Value Ref Range   Lipase 53 (H) 11  - 51 U/L  Troponin I (High Sensitivity)     Status: Abnormal   Collection Time: 12/16/22 10:55 PM  Result Value Ref Range   Troponin I (High Sensitivity) 37 (H) <18 ng/L  CBC with Differential     Status: Abnormal   Collection Time: 12/16/22 10:55 PM  Result Value Ref Range   WBC 8.5 4.0 - 10.5 K/uL   RBC 4.28 3.87 - 5.11 MIL/uL   Hemoglobin 12.1 12.0 - 15.0 g/dL   HCT 37.7 36.0 - 46.0 %   MCV 88.1 80.0 - 100.0 fL   MCH 28.3 26.0 - 34.0 pg   MCHC 32.1 30.0 - 36.0 g/dL   RDW 12.9 11.5 - 15.5 %   Platelets 154 150 - 400 K/uL   nRBC 0.0 0.0 - 0.2 %   Neutrophils Relative % 54 %   Neutro Abs 4.7 1.7 - 7.7 K/uL   Lymphocytes Relative 32 %   Lymphs Abs 2.7 0.7 - 4.0 K/uL   Monocytes Relative 9 %   Monocytes Absolute 0.7 0.1 - 1.0 K/uL   Eosinophils Relative 3 %   Eosinophils Absolute 0.2 0.0 - 0.5 K/uL   Basophils Relative 1 %   Basophils Absolute 0.1 0.0 - 0.1 K/uL   Immature Granulocytes 1 %   Abs Immature Granulocytes 0.09 (H) 0.00 - 0.07 K/uL  Brain natriuretic peptide     Status: None   Collection Time: 12/16/22 10:55 PM  Result Value Ref Range   B Natriuretic Peptide 88.6 0.0 - 100.0 pg/mL  Protime-INR     Status: None   Collection Time: 12/16/22 11:07 PM  Result Value Ref Range   Prothrombin Time 14.7 11.4 - 15.2 seconds   INR 1.2 0.8 - 1.2  APTT     Status: None   Collection Time: 12/16/22 11:07 PM  Result Value Ref Range   aPTT 31 24 - 36 seconds    Unresulted Labs (From admission, onward)     Start     Ordered   12/17/22 0800  Heparin level (unfractionated)  Once-Timed,   URGENT        12/17/22 0006   12/17/22 0102  PTH, intact and calcium  Add-on,   AD        12/17/22  0101   12/17/22 0102  25-Hydroxy vitamin D Lcms D2+D3  Add-on,   AD        12/17/22 0101   12/17/22 0102  Magnesium  Add-on,   AD        12/17/22 0101   12/17/22 0102  Phosphorus  Add-on,   AD        12/17/22 0101           Pt has received : Orders Placed This Encounter  Procedures    Critical Care    This order was created via procedure documentation    Standing Status:   Standing    Number of Occurrences:   1   US Venous Img Lower Bilateral (DVT)    Standing Status:   Standing    Number of Occurrences:   1    Order Specific Question:   Symptom/Reason for Exam    Answer:   Suspected deep vein thrombosis (DVT) [[id number]]   Comprehensive metabolic panel    Standing Status:   Standing    Number of Occurrences:   1   Lipase, blood    Standing Status:   Standing    Number of Occurrences:   1   CBC with Differential    Standing Status:   Standing    Number of Occurrences:   1   Protime-INR    Standing Status:   Standing    Number of Occurrences:   1   APTT    Standing Status:   Standing    Number of Occurrences:   1   Heparin level (unfractionated)    Standing Status:   Standing    Number of Occurrences:   1   Brain natriuretic peptide    Standing Status:   Standing    Number of Occurrences:   1   PTH, intact and calcium    Standing Status:   Standing    Number of Occurrences:   1   25-Hydroxy vitamin D Lcms D2+D3    Standing Status:   Standing    Number of Occurrences:   1   Magnesium    Standing Status:   Standing    Number of Occurrences:   1   Phosphorus    Standing Status:   Standing    Number of Occurrences:   1   Diet heart healthy/carb modified Room service appropriate? Yes; Fluid consistency: Thin    Standing Status:   Standing    Number of Occurrences:   1    Order Specific Question:   Diet-HS Snack?    Answer:   Nothing    Order Specific Question:   Room service appropriate?    Answer:   Yes    Order Specific Question:   Fluid consistency:    Answer:   Thin   Apply Diabetes Mellitus Care Plan    Standing Status:   Standing    Number of Occurrences:   1   STAT CBG when hypoglycemia is suspected. If treated, recheck every 15 minutes after each treatment until CBG >/= 70 mg/dl    Standing Status:   Standing    Number of Occurrences:   1    Refer to Hypoglycemia Protocol Sidebar Report for treatment of CBG < 70 mg/dl    Standing Status:   Standing    Number of Occurrences:   1   No HS correction Insulin    Standing Status:   Standing    Number of Occurrences:  1   Bed rest with bathroom privileges    Standing Status:   Standing    Number of Occurrences:   1   Apply Angina, Rule Out Myocardial Infarction Care Plan    Standing Status:   Standing    Number of Occurrences:   1   Vital signs with O2 sat q4 hours x 24 hours, then q shift    Standing Status:   Standing    Number of Occurrences:   1   Cardiac Monitoring Continuous x 24 hours Indications for use: Other; other indications for use: chest pain    Standing Status:   Standing    Number of Occurrences:   1    Order Specific Question:   Indications for use:    Answer:   Other    Order Specific Question:   other indications for use:    Answer:   chest pain   RN may order Cardiology PRN Orders utilizing "Cardiology PRN medications" (through manage orders) for the following patient needs:    indigestion (Maalox), cough (Robitussin DM), constipation (MOM), diarrhea (Imodium), or minor skin irritation (Hydrocortisone Cream)    Standing Status:   Standing    Number of Occurrences:   10258   No meal coverage at this time.    Standing Status:   Standing    Number of Occurrences:   1   Full code    Standing Status:   Standing    Number of Occurrences:   1    Order Specific Question:   By:    Answer:   Other   heparin per pharmacy consult    Standing Status:   Standing    Number of Occurrences:   1    Order Specific Question:   Indication:    Answer:   ACS / STEMI    Order Specific Question:   Comment:    Answer:   unstable angina   Consult to hospitalist    Standing Status:   Standing    Number of Occurrences:   1    Order Specific Question:   Place call to:    Answer:   ED, callback [phone]    Order Specific Question:   Reason for Consult    Answer:   Admit     Order Specific Question:   Diagnosis/Clinical Info for Consult:    Answer:   unstable angina   Inpatient consult to Cardiology [facility] only - Select consulting group: [facility]; Consult Timeframe: ROUTINE - requires response within 24 hours; Reason for Consult? unstable angina /nstemi .    Standing Status:   Standing    Number of Occurrences:   1    Order Specific Question:   [facility] only - Select consulting group    Answer:   [facility]    Order Specific Question:   Consult Timeframe    Answer:   ROUTINE - requires response within 24 hours    Order Specific Question:   Reason for Consult?    Answer:   unstable angina /nstemi .   EKG 12-Lead    Standing Status:   Standing    Number of Occurrences:   1   EKG 12-Lead (at 6am)    Standing Status:   Standing    Number of Occurrences:   1    Order Specific Question:   Notes    Answer:   chest pain   Saline lock IV    Standing Status:   Standing  Number of Occurrences:   1   Place in observation (patient's expected length of stay will be less than 2 midnights)    Standing Status:   Standing    Number of Occurrences:   1    Order Specific Question:   Hospital Area    Answer:   [place] [[id number]]    Order Specific Question:   Level of Care    Answer:   Telemetry Cardiac [103]    Order Specific Question:   Covid Evaluation    Answer:   Asymptomatic - no recent exposure (last 10 days) testing not required    Order Specific Question:   Diagnosis    Answer:   Chest pain [160109]    Order Specific Question:   Admitting Physician    Answer:   Cherylann Ratel    Order Specific Question:   Attending Physician    Answer:   Cherylann Ratel    Meds ordered this encounter  Medications   nitroGLYCERIN (NITROSTAT) SL tablet 0.4 mg   heparin bolus via infusion 3,900 Units   heparin ADULT infusion 100 units/mL (25000 units/281m)   insulin aspart (novoLOG) injection 0-15 Units    Order Specific Question:    Correction coverage:    Answer:   Moderate (average weight, post-op)    Order Specific Question:   CBG < 70:    Answer:   implement hypoglycemia protocol    Order Specific Question:   CBG 70 - 120:    Answer:   0 units    Order Specific Question:   CBG 121 - 150:    Answer:   2 units    Order Specific Question:   CBG 151 - 200:    Answer:   3 units    Order Specific Question:   CBG 201 - 250:    Answer:   5 units    Order Specific Question:   CBG 251 - 300:    Answer:   8 units    Order Specific Question:   CBG 301 - 350:    Answer:   11 units    Order Specific Question:   CBG 351 - 400:    Answer:   15 units    Order Specific Question:   CBG > 400    Answer:   call MD and obtain STAT lab verification   acetaminophen (TYLENOL) tablet 650 mg   ondansetron (ZOFRAN) injection 4 mg   0.9 %  sodium chloride infusion   aspirin EC tablet 81 mg   metoprolol succinate (TOPROL-XL) 24 hr tablet 50 mg   trimethoprim (TRIMPEX) tablet 100 mg   pravastatin (PRAVACHOL) tablet 10 mg   hydrALAZINE (APRESOLINE) injection 10 mg     Admission Imaging : No results found.  Physical Examination: Vitals:   12/16/22 2238 12/16/22 2300 12/16/22 2330 12/17/22 0000  BP:  (!) 166/55 (!) 157/55 (!) 152/60  Pulse:  69 64 62  Temp:      Resp:  (!) '21 18 15  '$ Height: '5\' 4"'$  (1.626 m)     Weight: 65.8 kg     SpO2:  99% 98%   TempSrc:      BMI (Calculated): 24.88      Physical Exam Vitals reviewed.  Constitutional:      General: She is not in acute distress.    Appearance: Normal appearance. She is not ill-appearing, toxic-appearing or diaphoretic.  HENT:     Head: Normocephalic  and atraumatic.     Right Ear: Hearing and external ear normal.     Left Ear: Hearing and external ear normal.     Nose: Nose normal. No nasal deformity.     Mouth/Throat:     Lips: Pink.     Mouth: Mucous membranes are moist.     Tongue: No lesions.     Pharynx: Oropharynx is clear.  Eyes:     Extraocular Movements:  Extraocular movements intact.     Pupils: Pupils are equal, round, and reactive to light.  Neck:     Vascular: No carotid bruit.  Cardiovascular:     Rate and Rhythm: Normal rate and regular rhythm.     Pulses: Normal pulses.     Heart sounds: Normal heart sounds.  Pulmonary:     Effort: Pulmonary effort is normal.     Breath sounds: Normal breath sounds.  Abdominal:     General: Bowel sounds are normal. There is no distension.     Palpations: Abdomen is soft. There is no mass.     Tenderness: There is no abdominal tenderness. There is no guarding.     Hernia: No hernia is present.  Musculoskeletal:     Right lower leg: No edema.     Left lower leg: No edema.  Skin:    General: Skin is warm.  Neurological:     General: No focal deficit present.     Mental Status: She is alert and oriented to person, place, and time.     Cranial Nerves: Cranial nerves 2-12 are intact.     Motor: Motor function is intact.  Psychiatric:        Attention and Perception: Attention normal.        Mood and Affect: Mood normal.        Speech: Speech normal.        Behavior: Behavior normal. Behavior is cooperative.        Cognition and Memory: Cognition normal.     Assessment and Plan: * Chest pain .  Essential hypertension .  Recurrent UTI .   >Chest Pain/ UA: Pt presenting with UA s/s since December, has SOB and LE edema since her trip in December LE edema has  resolved. EKG today shows ST depression in inf lat lead. TNI is 37. History is highly concerning. Heart score of 4.  Equally likely she may have PE d/w pt in setail that we will start with LE venous dopplers and obtain chest  V/q once her cardiac status is evaluated.      >Essential HTN: Vitals:   12/16/22 2235 12/16/22 2300 12/16/22 2330 12/17/22 0000  BP: (!) 189/75 (!) 166/55 (!) 157/55 (!) 152/60  Prn hydralazine. Continue metoprolol.   >H/O Recurrent UTI: Cont trimethoprim.  > Hypercalcemia: Suspect 2/2 meds /  dehydration/ supplementation and less likely sarcoid/ vit d toxicity / Primary Hyper PTH. We will hydrate overnight and follow. NS at 100 overnight.  Get mag level and intact PTH, vit d  and 1.25 dihydroxy level.    DVT prophylaxis:  Heparin gtt.    Code Status:  Full code.   Family Communication:  Ketina, Mars (Spouse)  [phone]    Disposition Plan:  Home.     Consults called:  Cardiology : Dr .Humphrey Rolls.  Admission status: Observation.    Unit/ Expected LOS: Cardiac tele / 2 days.     Para Skeans MD [facility]  6 PM- 2 AM. Please contact me via  secure Chat 6 PM-2 AM. [phone]( Pager ) To contact the [facility] Attending or Consulting provider Humphrey or covering provider during after hours [place], for this patient.   Check the care team in [facility] and look for a) attending/consulting [facility] provider listed and b) the [facility] team listed Log into [email or web address] and use [place]'s universal password to access. If you do not have the password, please contact the hospital operator. Locate the [facility] provider you are looking for under [facility] and page to a number that you can be directly reached. If you still have difficulty reaching the provider, please page the [facility] (Director on Call) for the Hospitalists listed on amion for assistance. [email or web address] 12/17/2022, 1:22 AM

## 2022-12-17 NOTE — Progress Notes (Signed)
ANTICOAGULATION CONSULT NOTE - Initial Consult  Pharmacy Consult for Heparin  Indication: chest pain/ACS  Allergies  Allergen Reactions   Cortisone Hives   Other Hives   Prednisone Shortness Of Breath   Rosuvastatin     Other reaction(s): Muscle Pain    Patient Measurements: Height: '5\' 4"'$  (162.6 cm) Weight: 65.8 kg (144 lb 15.9 oz) IBW/kg (Calculated) : 54.7 Heparin Dosing Weight: 65.8 kg   Vital Signs: Temp: 98 F (36.7 C) (01/19 0646) Temp Source: Oral (01/18 2235) BP: 151/65 (01/19 0600) Pulse Rate: 66 (01/19 0600)  Labs: Recent Labs    12/16/22 2255 12/16/22 2307 12/17/22 0053 12/17/22 0731  HGB 12.1  --   --   --   HCT 37.7  --   --   --   PLT 154  --   --   --   APTT  --  31  --   --   LABPROT  --  14.7  --   --   INR  --  1.2  --   --   HEPARINUNFRC  --   --   --  0.16*  CREATININE 1.09*  --   --   --   TROPONINIHS 37*  --  281*  --      Estimated Creatinine Clearance: 38.4 mL/min (A) (by C-G formula based on SCr of 1.09 mg/dL (H)).   Medical History: Past Medical History:  Diagnosis Date   Basal cell carcinoma    chest    Cancer [facility])    uterine   History of COVID-19    Hypertension     Medications:  (Not in a hospital admission)  Assessment: Pharmacy consulted to dose heparin in this 82 year old female admitted with ACS/NSTEMI.  No prior anticoag noted.  Goal of Therapy:  Heparin level 0.3-0.7 units/ml Monitor platelets by anticoagulation protocol: Yes   Plan: Heparin level subtherapeutic @ 800 units/hr --Order heparin 2000 units IV x 1 bolus --Increase heparin drip to 1000 units/hr --Heparin level 8 hours after rate change --Daily CBC while on heparin drip   Lorin Picket, PharmD 12/17/2022,8:16 AM

## 2022-12-17 NOTE — Progress Notes (Signed)
ANTICOAGULATION CONSULT NOTE - Initial Consult  Pharmacy Consult for Heparin  Indication: chest pain/ACS  Allergies  Allergen Reactions   Cortisone Hives   Other Hives   Prednisone Shortness Of Breath   Rosuvastatin     Other reaction(s): Muscle Pain    Patient Measurements: Height: '5\' 4"'$  (162.6 cm) Weight: 65.8 kg (144 lb 15.9 oz) IBW/kg (Calculated) : 54.7 Heparin Dosing Weight: 65.8 kg   Vital Signs: Temp: 97.9 F (36.6 C) (01/19 0913) Temp Source: Oral (01/19 0913) BP: 122/64 (01/19 1330) Pulse Rate: 69 (01/19 1330)  Labs: Recent Labs    12/16/22 2255 12/16/22 2307 12/17/22 0053 12/17/22 0731  HGB 12.1  --   --   --   HCT 37.7  --   --   --   PLT 154  --   --   --   APTT  --  31  --   --   LABPROT  --  14.7  --   --   INR  --  1.2  --   --   HEPARINUNFRC  --   --   --  0.16*  CREATININE 1.09*  --   --   --   TROPONINIHS 37*  --  281*  --      Estimated Creatinine Clearance: 38.4 mL/min (A) (by C-G formula based on SCr of 1.09 mg/dL (H)).   Medical History: Past Medical History:  Diagnosis Date   Basal cell carcinoma    chest    Cancer [facility])    uterine   History of COVID-19    Hypertension     Medications:  Medications Prior to Admission  Medication Sig Dispense Refill Last Dose   aspirin EC 81 MG tablet Take 81 mg by mouth daily.   12/15/2022 at 2230   Calcipotriene 0.005 % solution 1 Application as needed.   prn at prn   Calcium Carbonate-Vitamin D 500-125 MG-UNIT TABS Take 1 tablet by mouth daily at 10 pm.   12/15/2022 at 2230   cetirizine (ZYRTEC) 10 MG tablet Take 10 mg by mouth daily.   12/15/2022 at 2230   chlorhexidine (PERIDEX) 0.12 % solution Use as directed 15 mLs in the mouth or throat as needed.   prn at prn   Cholecalciferol (VITAMIN D) 2000 units tablet Take 2,000 Units by mouth daily.   12/15/2022 at 2230   fluticasone (FLONASE) 50 MCG/ACT nasal spray Place into both nostrils daily.   12/17/4172 at 0814   folic acid (FOLVITE) 481 MCG  tablet Take 400 mcg by mouth daily.   12/15/2022 at 2230   metoprolol succinate (TOPROL-XL) 50 MG 24 hr tablet TAKE 1 TABLET(50 MG) BY MOUTH EVERY DAY   12/15/2022 at 2230   pimecrolimus (ELIDEL) 1 % cream Apply 1 Application topically as needed.   prn at prn   pravastatin (PRAVACHOL) 10 MG tablet Take 10 mg by mouth at bedtime.   12/15/2022 at 2230   pyridoxine (B-6) 100 MG tablet Take 100 mg by mouth daily.   12/15/2022 at 2230   ramipril (ALTACE) 10 MG capsule Take 10 mg by mouth daily.   12/15/2022 at 2230   trimethoprim (TRIMPEX) 100 MG tablet Take 1 tablet (100 mg total) by mouth daily. 90 tablet 3 12/15/2022 at 2230   TURMERIC PO Take 1 capsule by mouth as needed.   prn at prn   vitamin B-12 (CYANOCOBALAMIN) 250 MCG tablet Take 250 mcg by mouth daily.   12/15/2022 at 2230  minoxidil (LONITEN) 2.5 MG tablet Take 1/2 tablet daily (Patient not taking: Reported on 12/17/2022) 30 tablet 2 Not Taking   ramipril (ALTACE) 5 MG capsule Take 5 mg by mouth daily. (Patient not taking: Reported on 12/17/2022)   Not Taking   rosuvastatin (CRESTOR) 5 MG tablet Take by mouth. (Patient not taking: Reported on 12/17/2022)   Not Taking    Assessment: Pharmacy consulted to dose heparin in this 82 year old female admitted with ACS/NSTEMI.  No prior anticoag noted.  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 Recommendation per Cardiology is for CABG. [facility] has been contacted and have accepted patient, currently awaiting transport  Goal of Therapy:  Heparin level 0.3-0.7 units/ml Monitor platelets by anticoagulation protocol: Yes   Plan:  --Will continue heparin infusion as transport is pending for unknown time. --Will restart heparin drip post-cath at previous rate of 1000 units/hr --Heparin level 8 hours after restart --Daily CBC while on heparin drip  Pearla Dubonnet, PharmD Clinical  Pharmacist 12/17/2022 1:42 PM

## 2022-12-17 NOTE — Progress Notes (Signed)
*  PRELIMINARY RESULTS* Echocardiogram 2D Echocardiogram has been performed.  Anne Ross 12/17/2022, 12:23 PM

## 2022-12-17 NOTE — Progress Notes (Signed)
Patient had a cardiac catheterization without complications.  Left heart catheterization revealed ostial 99% lesion in his left circumflex which is calcified and 75% mid LAD with which is also calcified and 30% ostial RCA which is calcified.  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  Patient is scheduled for CABG at [facility].  I spoke to Dr. Nena Polio to review the surgeon.  Phone number is area [phone].  I spoke to Dr. Marylen Ponto and he said that he will arrange for transport as soon as possible for CABG.  Patient will be continued on IV nitro 15 mics and continue with heparin after 1 hour of cessation after the cardiac cath.

## 2022-12-17 NOTE — Consult Note (Signed)
Anne Ross is a 82 y.o. female  [id number]  Primary Cardiologist: Neoma Laming Reason for Consultation: Non-STEMI  HPI: 82 year old white female with history of having chest pain since last month associated with shortness of breath and diaphoresis.  Chest pain got worse thus patient came to the emergency room.   Review of Systems: No PND or leg swelling   Past Medical History:  Diagnosis Date   Basal cell carcinoma    chest    Cancer [facility])    uterine   History of COVID-19    Hypertension     (Not in a hospital admission)     aspirin EC  81 mg Oral Daily   insulin aspart  0-15 Units Subcutaneous TID WC   metoprolol succinate  50 mg Oral Daily   pravastatin  10 mg Oral q1800   sodium chloride flush  3 mL Intravenous Q12H   trimethoprim  100 mg Oral Daily    Infusions:  sodium chloride 100 mL/hr at 12/17/22 0050   heparin 800 Units/hr (12/17/22 0740)    Allergies  Allergen Reactions   Cortisone Hives   Other Hives   Prednisone Shortness Of Breath   Rosuvastatin     Other reaction(s): Muscle Pain    Social History   Socioeconomic History   Marital status: Married    Spouse name: Not on file   Number of children: Not on file   Years of education: Not on file   Highest education level: Not on file  Occupational History   Not on file  Tobacco Use   Smoking status: Never   Smokeless tobacco: Never  Substance and Sexual Activity   Alcohol use: No   Drug use: No   Sexual activity: Yes    Birth control/protection: Post-menopausal  Other Topics Concern   Not on file  Social History Narrative   Not on file   Social Determinants of Health   Financial Resource Strain: Not on file  Food Insecurity: No Food Insecurity (12/17/2022)   Hunger Vital Sign    Worried About Running Out of Food in the Last Year: Never true    Ran Out of Food in the Last Year: Never true  Transportation Needs: No Transportation Needs (12/17/2022)   PRAPARE -  Hydrologist (Medical): No    Lack of Transportation (Non-Medical): No  Physical Activity: Not on file  Stress: Not on file  Social Connections: Not on file  Intimate Partner Violence: Not At Risk (12/17/2022)   Humiliation, Afraid, Rape, and Kick questionnaire    Fear of Current or Ex-Partner: No    Emotionally Abused: No    Physically Abused: No    Sexually Abused: No    Family History  Problem Relation Age of Onset   Prostate cancer Maternal Grandfather    Breast cancer Neg Hx     PHYSICAL EXAM: Vitals:   12/17/22 0600 12/17/22 0646  BP: (!) 151/65   Pulse: 66   Resp: 12   Temp:  98 F (36.7 C)  SpO2: 98%     No intake or output data in the 24 hours ending 12/17/22 0809  General:  Well appearing. No respiratory difficulty HEENT: normal Neck: supple. no JVD. Carotids 2+ bilat; no bruits. No lymphadenopathy or thryomegaly appreciated. Cor: PMI nondisplaced. Regular rate & rhythm. No rubs, gallops or murmurs. Lungs: clear Abdomen: soft, nontender, nondistended. No hepatosplenomegaly. No bruits or masses. Good bowel sounds. Extremities: no cyanosis, clubbing,  rash, edema Neuro: alert & oriented x 3, cranial nerves grossly intact. moves all 4 extremities w/o difficulty. Affect pleasant.  ECG: Normal sinus rhythm with horizontal ST depression in inferior leads and anterior leads.  Results for orders placed or performed during the hospital encounter of 12/16/22 (from the past 24 hour(s))  Comprehensive metabolic panel     Status: Abnormal   Collection Time: 12/16/22 10:55 PM  Result Value Ref Range   Sodium 136 135 - 145 mmol/L   Potassium 4.4 3.5 - 5.1 mmol/L   Chloride 108 98 - 111 mmol/L   CO2 18 (L) 22 - 32 mmol/L   Glucose, Bld 134 (H) 70 - 99 mg/dL   BUN 20 8 - 23 mg/dL   Creatinine, Ser 1.09 (H) 0.44 - 1.00 mg/dL   Calcium 9.3 8.9 - 10.3 mg/dL   Total Protein 7.8 6.5 - 8.1 g/dL   Albumin 4.3 3.5 - 5.0 g/dL   AST 58 (H) 15 - 41  U/L   ALT 50 (H) 0 - 44 U/L   Alkaline Phosphatase 41 38 - 126 U/L   Total Bilirubin 0.6 0.3 - 1.2 mg/dL   GFR, Estimated 51 (L) >60 mL/min   Anion gap 10 5 - 15  Lipase, blood     Status: Abnormal   Collection Time: 12/16/22 10:55 PM  Result Value Ref Range   Lipase 53 (H) 11 - 51 U/L  Troponin I (High Sensitivity)     Status: Abnormal   Collection Time: 12/16/22 10:55 PM  Result Value Ref Range   Troponin I (High Sensitivity) 37 (H) <18 ng/L  CBC with Differential     Status: Abnormal   Collection Time: 12/16/22 10:55 PM  Result Value Ref Range   WBC 8.5 4.0 - 10.5 K/uL   RBC 4.28 3.87 - 5.11 MIL/uL   Hemoglobin 12.1 12.0 - 15.0 g/dL   HCT 37.7 36.0 - 46.0 %   MCV 88.1 80.0 - 100.0 fL   MCH 28.3 26.0 - 34.0 pg   MCHC 32.1 30.0 - 36.0 g/dL   RDW 12.9 11.5 - 15.5 %   Platelets 154 150 - 400 K/uL   nRBC 0.0 0.0 - 0.2 %   Neutrophils Relative % 54 %   Neutro Abs 4.7 1.7 - 7.7 K/uL   Lymphocytes Relative 32 %   Lymphs Abs 2.7 0.7 - 4.0 K/uL   Monocytes Relative 9 %   Monocytes Absolute 0.7 0.1 - 1.0 K/uL   Eosinophils Relative 3 %   Eosinophils Absolute 0.2 0.0 - 0.5 K/uL   Basophils Relative 1 %   Basophils Absolute 0.1 0.0 - 0.1 K/uL   Immature Granulocytes 1 %   Abs Immature Granulocytes 0.09 (H) 0.00 - 0.07 K/uL  Brain natriuretic peptide     Status: None   Collection Time: 12/16/22 10:55 PM  Result Value Ref Range   B Natriuretic Peptide 88.6 0.0 - 100.0 pg/mL  Protime-INR     Status: None   Collection Time: 12/16/22 11:07 PM  Result Value Ref Range   Prothrombin Time 14.7 11.4 - 15.2 seconds   INR 1.2 0.8 - 1.2  APTT     Status: None   Collection Time: 12/16/22 11:07 PM  Result Value Ref Range   aPTT 31 24 - 36 seconds  Troponin I (High Sensitivity)     Status: Abnormal   Collection Time: 12/17/22 12:53 AM  Result Value Ref Range   Troponin I (High Sensitivity) 281 (HH) <18  ng/L  CBG monitoring, ED     Status: Abnormal   Collection Time: 12/17/22  7:27 AM   Result Value Ref Range   Glucose-Capillary 123 (H) 70 - 99 mg/dL   US Venous Img Lower Bilateral (DVT)  Result Date: 12/17/2022 CLINICAL DATA:  Shortness of breath and leg pain and swelling, initial encounter EXAM: BILATERAL LOWER EXTREMITY VENOUS DOPPLER ULTRASOUND TECHNIQUE: Gray-scale sonography with graded compression, as well as color Doppler and duplex ultrasound were performed to evaluate the lower extremity deep venous systems from the level of the common femoral vein and including the common femoral, femoral, profunda femoral, popliteal and calf veins including the posterior tibial, peroneal and gastrocnemius veins when visible. The superficial great saphenous vein was also interrogated. Spectral Doppler was utilized to evaluate flow at rest and with distal augmentation maneuvers in the common femoral, femoral and popliteal veins. COMPARISON:  None Available. FINDINGS: RIGHT LOWER EXTREMITY Common Femoral Vein: No evidence of thrombus. Normal compressibility, respiratory phasicity and response to augmentation. Saphenofemoral Junction: No evidence of thrombus. Normal compressibility and flow on color Doppler imaging. Profunda Femoral Vein: No evidence of thrombus. Normal compressibility and flow on color Doppler imaging. Femoral Vein: No evidence of thrombus. Normal compressibility, respiratory phasicity and response to augmentation. Popliteal Vein: No evidence of thrombus. Normal compressibility, respiratory phasicity and response to augmentation. Calf Veins: No evidence of thrombus. Normal compressibility and flow on color Doppler imaging. Superficial Great Saphenous Vein: No evidence of thrombus. Normal compressibility. Venous Reflux:  None. Other Findings:  None. LEFT LOWER EXTREMITY Common Femoral Vein: No evidence of thrombus. Normal compressibility, respiratory phasicity and response to augmentation. Saphenofemoral Junction: No evidence of thrombus. Normal compressibility and flow on color  Doppler imaging. Profunda Femoral Vein: No evidence of thrombus. Normal compressibility and flow on color Doppler imaging. Femoral Vein: No evidence of thrombus. Normal compressibility, respiratory phasicity and response to augmentation. Popliteal Vein: No evidence of thrombus. Normal compressibility, respiratory phasicity and response to augmentation. Calf Veins: No evidence of thrombus. Normal compressibility and flow on color Doppler imaging. Superficial Great Saphenous Vein: No evidence of thrombus. Normal compressibility. Venous Reflux:  None. Other Findings:  None. IMPRESSION: No evidence of deep venous thrombosis in either lower extremity. Electronically Signed   By: Inez Catalina M.D.   On: 12/17/2022 01:12   CT CHEST WO CONTRAST  Result Date: 12/16/2022 CLINICAL DATA:  Chest pain EXAM: CT CHEST WITHOUT CONTRAST TECHNIQUE: Multidetector CT imaging of the chest was performed following the standard protocol without IV contrast. RADIATION DOSE REDUCTION: This exam was performed according to the departmental dose-optimization program which includes automated exposure control, adjustment of the mA and/or kV according to patient size and/or use of iterative reconstruction technique. COMPARISON:  None Available. FINDINGS: Cardiovascular: No significant vascular findings. Normal heart size. No pericardial effusion. Atheromatous calcifications of coronary arteries and aorta. Mediastinum/Nodes: No enlarged mediastinal or axillary lymph nodes. Thyroid gland, trachea, and esophagus demonstrate no significant findings. Lungs/Pleura: Changes consistent with scarring and subsegmental atelectasis. No alveolar consolidation to suggest pneumonia. No evidence of pulmonary edema. No pneumothorax or pleural effusion. Peripheral pulmonary interstitial Upper Abdomen: Hepatomegaly.  Small hiatal hernia. Musculoskeletal: No chest wall mass or suspicious bone lesions identified. IMPRESSION: 1. Pulmonary interstitial changes  consistent with scarring or subsegmental atelectasis. 2. Small hiatal hernia. 3. The liver was prominent and only partially imaged. Electronically Signed   By: Sammie Bench M.D.   On: 12/16/2022 12:04     ASSESSMENT AND PLAN: Non-STEMI with chest pain gradually  getting worse with increasing frequency ambulation and elevated troponin along with ST depression inferolaterally suggestive of inferior ischemia.  Plan to do left heart catheterization today.  Alesandro Stueve A

## 2022-12-17 NOTE — Discharge Summary (Signed)
Physician Discharge Summary   Anne Ross  female DOB: 01/18/1941  NLZ:[id number]  PCP: Sofie Hartigan, MD  Admit date: 12/16/2022 Discharge date: 12/17/2022  Admitted From: home Disposition:  transfer to [facility] for CABG CODE STATUS: Full code   Hospital Course:  For full details, please see H&P, progress notes, consult notes and ancillary notes.  Briefly,  Anne Ross is an 82 y.o. female with hx significant of HTN who presented with chest pain, intermittent since Dec 2023.  NSTEMI Multi-vessel CAD --trop 37 on presentation and went up to 281.  Pt was started on heparin gtt.  Chest pain improved with nitro gtt.   Left heart cath showed Ost RCA lesion is 40% stenosed. Dist LM to Ost LAD lesion is 35% stenosed with 95% stenosed side branch in Ost Cx. Mid LAD lesion is 75% stenosed. 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 --Cardiology Dr. Humphrey Rolls recommended transferring to [facility] for CABG.    HTN --cont home Toprol and ramipril  Hx of recurrent UTI --cont home trimethoprim   Unless noted above, medications under "STOP" list are ones pt was not taking PTA.  Discharge Diagnoses:  Principal Problem:   Chest pain Active Problems:   Essential hypertension   Recurrent UTI   NSTEMI (non-ST elevated myocardial infarction) [facility])     Discharge Instructions:  Allergies as of 12/17/2022       Reactions   Cortisone Hives   Other Hives   Prednisone Shortness Of Breath   Rosuvastatin    Other reaction(s): Muscle Pain        Medication List     STOP taking these medications    minoxidil 2.5 MG tablet Commonly known as: LONITEN   rosuvastatin 5 MG tablet Commonly known as: CRESTOR       TAKE these medications    aspirin EC 81 MG tablet Take 81 mg by mouth daily.   Calcipotriene 0.005 % solution 1 Application as needed.    Calcium Carbonate-Vitamin D 500-125 MG-UNIT Tabs Take 1 tablet by mouth daily at 10 pm.   cetirizine 10 MG tablet Commonly known as: ZYRTEC Take 10 mg by mouth daily.   chlorhexidine 0.12 % solution Commonly known as: PERIDEX Use as directed 15 mLs in the mouth or throat as needed.   cyanocobalamin 250 MCG tablet Commonly known as: VITAMIN B12 Take 250 mcg by mouth daily.   fluticasone 50 MCG/ACT nasal spray Commonly known as: FLONASE Place into both nostrils daily.   folic acid 902 MCG tablet Commonly known as: FOLVITE Take 400 mcg by mouth daily.   heparin 25000 UT/250ML infusion Inject 1,000 Units/hr into the vein continuous.   metoprolol succinate 50 MG 24 hr tablet Commonly known as: TOPROL-XL TAKE 1 TABLET(50 MG) BY MOUTH EVERY DAY   nitroGLYCERIN 0.2 mg/mL infusion Inject 0-200 mcg/min into the vein continuous.   pimecrolimus 1 % cream Commonly known as: ELIDEL Apply 1 Application topically as needed.   pravastatin 10 MG tablet Commonly known as: PRAVACHOL Take 10 mg by mouth at bedtime.   pyridoxine 100 MG tablet Commonly known as: B-6 Take 100 mg by mouth daily.   ramipril 10 MG capsule Commonly known as: ALTACE Take 10 mg by mouth daily. What changed: Another medication with the same name was removed. Continue taking this medication, and follow the directions you see here.   trimethoprim 100 MG tablet Commonly known  as: TRIMPEX Take 1 tablet (100 mg total) by mouth daily.   TURMERIC PO Take 1 capsule by mouth as needed.   Vitamin D 50 MCG (2000 UT) tablet Take 2,000 Units by mouth daily.          Allergies  Allergen Reactions   Cortisone Hives   Other Hives   Prednisone Shortness Of Breath   Rosuvastatin     Other reaction(s): Muscle Pain     The results of significant diagnostics from this hospitalization (including imaging, microbiology, ancillary and laboratory) are listed below for reference.    Consultations:   Procedures/Studies: ECHOCARDIOGRAM COMPLETE  Result Date: 12/17/2022    ECHOCARDIOGRAM REPORT   Patient Name:   EMELYNN RANCE Date of Exam: 12/17/2022 Medical Rec #:  [id number]             Height:       64.0 in Accession #:    [id number]            Weight:       145.0 lb Date of Birth:  1941/04/21             BSA:          1.706 m Patient Age:    51 years              BP:           108/60 mmHg Patient Gender: F                     HR:           70 bpm. Exam Location:  [facility] Procedure: 2D Echo, Color Doppler and Cardiac Doppler STAT ECHO Indications:     Pericardial Effusion I31.3  History:         Patient has no prior history of Echocardiogram examinations.                  Risk Factors:Hypertension.  Sonographer:     Sherrie Sport Referring Phys:  [place] Diagnosing Phys: Neoma Laming  Sonographer Comments: Image quality was good. IMPRESSIONS  1. Left ventricular ejection fraction, by estimation, is 65 to 70%. The left ventricle has normal function. The left ventricle has no regional wall motion abnormalities. 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 Left  ventricular diastolic parameters are consistent with Grade I diastolic dysfunction (impaired relaxation).  2. Right ventricular systolic function is normal. The right ventricular size is normal.  3. Left atrial size was mildly dilated.  4. Right atrial size was mildly dilated.  5. The mitral valve is normal in structure. Trivial mitral valve regurgitation. No evidence of mitral stenosis.  6. The aortic valve is normal in structure. Aortic valve regurgitation is not visualized. No aortic stenosis is present.  7. The inferior vena cava is normal in size with greater than 50% respiratory variability, suggesting right atrial pressure of 3 mmHg. FINDINGS  Left Ventricle: Left ventricular ejection fraction, by estimation, is 65 to 70%. The left ventricle has normal function. The left  ventricle has no regional wall motion abnormalities. The left ventricular internal cavity size was normal in size. 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 Left ventricular diastolic parameters are consistent with Grade I diastolic dysfunction (impaired relaxation).  LV Wall Scoring: Right Ventricle: The right ventricular size is normal. No increase in right ventricular wall thickness. Right ventricular systolic function is normal. Left Atrium:  Left atrial size was mildly dilated. Right Atrium: Right atrial size was mildly dilated. Pericardium: There is no evidence of pericardial effusion. Mitral Valve: The mitral valve is normal in structure. Trivial mitral valve regurgitation. No evidence of mitral valve stenosis. Tricuspid Valve: The tricuspid valve is normal in structure. Tricuspid valve regurgitation is trivial. No evidence of tricuspid stenosis. Aortic Valve: The aortic valve is normal in structure. Aortic valve regurgitation is not visualized. No aortic stenosis is present. Aortic valve mean gradient measures 2.0 mmHg. Aortic valve peak gradient measures 4.2 mmHg. Aortic valve area, by VTI measures 3.08 cm. Pulmonic Valve: The pulmonic valve was normal in structure. Pulmonic valve regurgitation is not visualized. No evidence of pulmonic stenosis. Aorta: The aortic root is normal in size and structure. Venous: The inferior vena cava is normal in size with greater than 50% respiratory variability, suggesting right atrial pressure of 3 mmHg. IAS/Shunts: No atrial level shunt detected by color flow Doppler.  LEFT VENTRICLE PLAX 2D LVIDd:         3.20 cm   Diastology LVIDs:         2.00 cm   LV e' medial:    8.27 cm/s LV PW:         0.90 cm   LV E/e' medial:  9.4 LV IVS:        1.20 cm   LV e' lateral:   7.40 cm/s LVOT diam:     2.00 cm   LV E/e' lateral: 10.5 LV SV:         58 LV SV Index:   34 LVOT Area:     3.14 cm  RIGHT VENTRICLE RV Basal diam:  2.70 cm RV Mid diam:     1.70 cm RV S prime:     15.10 cm/s TAPSE (M-mode): 2.0 cm LEFT ATRIUM           Index        RIGHT ATRIUM           Index LA diam:      3.00 cm 1.76 cm/m   RA Area:     12.60 cm LA Vol (A2C): 20.1 ml 11.78 ml/m  RA Volume:   27.10 ml  15.88 ml/m LA Vol (A4C): 34.3 ml 20.10 ml/m  AORTIC VALVE AV Area (Vmax):    2.60 cm AV Area (Vmean):   2.57 cm AV Area (VTI):     3.08 cm AV Vmax:           103.00 cm/s AV Vmean:          67.200 cm/s AV VTI:            0.189 m AV Peak Grad:      4.2 mmHg AV Mean Grad:      2.0 mmHg LVOT Vmax:         85.40 cm/s LVOT Vmean:        55.000 cm/s LVOT VTI:          0.185 m LVOT/AV VTI ratio: 0.98  AORTA Ao Root diam: 2.80 cm MITRAL VALVE               TRICUSPID VALVE MV Area (PHT): 3.15 cm    TR Peak grad:   12.0 mmHg MV Decel Time: 241 msec    TR Vmax:        173.00 cm/s MV E velocity: 77.60 cm/s MV A velocity: 99.60 cm/s  SHUNTS MV E/A ratio:  0.78  Systemic VTI:  0.18 m                            Systemic Diam: 2.00 cm Neoma Laming Electronically signed by Neoma Laming Signature Date/Time: 12/17/2022/1:02:03 PM    Final    CARDIAC CATHETERIZATION  Result Date: 12/17/2022   Ost RCA lesion is 40% stenosed.   Dist LM to Ost LAD lesion is 35% stenosed with 95% stenosed side branch in Ost Cx.   Mid LAD lesion is 75% stenosed.   The left ventricular systolic function is normal.   LV end diastolic pressure is moderately elevated.   The left ventricular ejection fraction is greater than 65% by visual estimate.   There is no aortic valve stenosis.   There is no mitral valve stenosis.   There is no mitral valve regurgitation. Critical osteal LCX, high grade mid LAD, normal EF, with apical ?Aneurysm . Will get echo, and advise CABG [facility]   US Venous Img Lower Bilateral (DVT)  Result Date: 12/17/2022 CLINICAL DATA:  Shortness of breath and leg pain and swelling, initial encounter EXAM: BILATERAL LOWER EXTREMITY VENOUS DOPPLER ULTRASOUND TECHNIQUE: Gray-scale sonography with  graded compression, as well as color Doppler and duplex ultrasound were performed to evaluate the lower extremity deep venous systems from the level of the common femoral vein and including the common femoral, femoral, profunda femoral, popliteal and calf veins including the posterior tibial, peroneal and gastrocnemius veins when visible. The superficial great saphenous vein was also interrogated. Spectral Doppler was utilized to evaluate flow at rest and with distal augmentation maneuvers in the common femoral, femoral and popliteal veins. COMPARISON:  None Available. FINDINGS: RIGHT LOWER EXTREMITY Common Femoral Vein: No evidence of thrombus. Normal compressibility, respiratory phasicity and response to augmentation. Saphenofemoral Junction: No evidence of thrombus. Normal compressibility and flow on color Doppler imaging. Profunda Femoral Vein: No evidence of thrombus. Normal compressibility and flow on color Doppler imaging. Femoral Vein: No evidence of thrombus. Normal compressibility, respiratory phasicity and response to augmentation. Popliteal Vein: No evidence of thrombus. Normal compressibility, respiratory phasicity and response to augmentation. Calf Veins: No evidence of thrombus. Normal compressibility and flow on color Doppler imaging. Superficial Great Saphenous Vein: No evidence of thrombus. Normal compressibility. Venous Reflux:  None. Other Findings:  None. LEFT LOWER EXTREMITY Common Femoral Vein: No evidence of thrombus. Normal compressibility, respiratory phasicity and response to augmentation. Saphenofemoral Junction: No evidence of thrombus. Normal compressibility and flow on color Doppler imaging. Profunda Femoral Vein: No evidence of thrombus. Normal compressibility and flow on color Doppler imaging. Femoral Vein: No evidence of thrombus. Normal compressibility, respiratory phasicity and response to augmentation. Popliteal Vein: No evidence of thrombus. Normal compressibility, respiratory  phasicity and response to augmentation. Calf Veins: No evidence of thrombus. Normal compressibility and flow on color Doppler imaging. Superficial Great Saphenous Vein: No evidence of thrombus. Normal compressibility. Venous Reflux:  None. Other Findings:  None. IMPRESSION: No evidence of deep venous thrombosis in either lower extremity. Electronically Signed   By: Inez Catalina M.D.   On: 12/17/2022 01:12   CT CHEST WO CONTRAST  Result Date: 12/16/2022 CLINICAL DATA:  Chest pain EXAM: CT CHEST WITHOUT CONTRAST TECHNIQUE: Multidetector CT imaging of the chest was performed following the standard protocol without IV contrast. RADIATION DOSE REDUCTION: This exam was performed according to the departmental dose-optimization program which includes automated exposure control, adjustment of the mA and/or kV according to patient size and/or use of  iterative reconstruction technique. COMPARISON:  None Available. FINDINGS: Cardiovascular: No significant vascular findings. Normal heart size. No pericardial effusion. Atheromatous calcifications of coronary arteries and aorta. Mediastinum/Nodes: No enlarged mediastinal or axillary lymph nodes. Thyroid gland, trachea, and esophagus demonstrate no significant findings. Lungs/Pleura: Changes consistent with scarring and subsegmental atelectasis. No alveolar consolidation to suggest pneumonia. No evidence of pulmonary edema. No pneumothorax or pleural effusion. Peripheral pulmonary interstitial Upper Abdomen: Hepatomegaly.  Small hiatal hernia. Musculoskeletal: No chest wall mass or suspicious bone lesions identified. IMPRESSION: 1. Pulmonary interstitial changes consistent with scarring or subsegmental atelectasis. 2. Small hiatal hernia. 3. The liver was prominent and only partially imaged. Electronically Signed   By: Sammie Bench M.D.   On: 12/16/2022 12:04      Labs: BNP (last 3 results) Recent Labs    12/16/22 2255  BNP 76.7   Basic Metabolic Panel: Recent  Labs  Lab 12/16/22 2255 12/17/22 0731  NA 136  --   K 4.4  --   CL 108  --   CO2 18*  --   GLUCOSE 134*  --   BUN 20  --   CREATININE 1.09*  --   CALCIUM 9.3  --   MG  --  1.8  PHOS  --  3.5   Liver Function Tests: Recent Labs  Lab 12/16/22 2255  AST 58*  ALT 50*  ALKPHOS 41  BILITOT 0.6  PROT 7.8  ALBUMIN 4.3   Recent Labs  Lab 12/16/22 2255  LIPASE 53*   No results for input(s): "AMMONIA" in the last 168 hours. CBC: Recent Labs  Lab 12/16/22 2255  WBC 8.5  NEUTROABS 4.7  HGB 12.1  HCT 37.7  MCV 88.1  PLT 154   Cardiac Enzymes: No results for input(s): "CKTOTAL", "CKMB", "CKMBINDEX", "TROPONINI" in the last 168 hours. BNP: Invalid input(s): "POCBNP" CBG: Recent Labs  Lab 12/17/22 0727  GLUCAP 123*   D-Dimer No results for input(s): "DDIMER" in the last 72 hours. Hgb A1c No results for input(s): "HGBA1C" in the last 72 hours. Lipid Profile No results for input(s): "CHOL", "HDL", "LDLCALC", "TRIG", "CHOLHDL", "LDLDIRECT" in the last 72 hours. Thyroid function studies No results for input(s): "TSH", "T4TOTAL", "T3FREE", "THYROIDAB" in the last 72 hours.  Invalid input(s): "FREET3" Anemia work up No results for input(s): "VITAMINB12", "FOLATE", "FERRITIN", "TIBC", "IRON", "RETICCTPCT" in the last 72 hours. Urinalysis    Component Value Date/Time   APPEARANCEUR Clear 12/08/2022 1302   GLUCOSEU Negative 12/08/2022 1302   BILIRUBINUR Negative 12/08/2022 1302   PROTEINUR Negative 12/08/2022 1302   NITRITE Negative 12/08/2022 1302   LEUKOCYTESUR Negative 12/08/2022 1302   Sepsis Labs Recent Labs  Lab 12/16/22 2255  WBC 8.5   Microbiology Recent Results (from the past 240 hour(s))  Microscopic Examination     Status: None   Collection Time: 12/08/22  1:02 PM   Urine  Result Value Ref Range Status   WBC, UA 0-5 0 - 5 /hpf Final   RBC, Urine 0-2 0 - 2 /hpf Final   Epithelial Cells (non renal) 0-10 0 - 10 /hpf Final   Bacteria, UA None  seen None seen/Few Final     Total time spend on discharging this patient, including the last patient exam, discussing the hospital stay, instructions for ongoing care as it relates to all pertinent caregivers, as well as preparing the medical discharge records, prescriptions, and/or referrals as applicable, is 35 minutes.    Enzo Bi, MD  [facility] 12/17/2022, 1:22 PM

## 2022-12-19 LAB — PTH, INTACT AND CALCIUM
Calcium, Total (PTH): 9.2 mg/dL (ref 8.7–10.3)
PTH: 18 pg/mL (ref 15–65)

## 2022-12-20 ENCOUNTER — Encounter: Payer: Self-pay | Admitting: Cardiovascular Disease

## 2022-12-23 LAB — 25-HYDROXY VITAMIN D LCMS D2+D3
25-Hydroxy, Vitamin D-2: <1 ng/mL
25-Hydroxy, Vitamin D-3: 49 ng/mL
25-Hydroxy, Vitamin D: 50 ng/mL

## 2023-01-06 ENCOUNTER — Encounter: Payer: Medicare Other | Attending: Cardiology | Admitting: *Deleted

## 2023-01-06 ENCOUNTER — Encounter: Payer: Self-pay | Admitting: *Deleted

## 2023-01-06 DIAGNOSIS — I214 Non-ST elevation (NSTEMI) myocardial infarction: Secondary | ICD-10-CM | POA: Insufficient documentation

## 2023-01-06 DIAGNOSIS — Z955 Presence of coronary angioplasty implant and graft: Secondary | ICD-10-CM | POA: Insufficient documentation

## 2023-01-06 NOTE — Progress Notes (Signed)
Virtual orientation call completed today. shehas an appointment on Date: 01/10/2023  for EP eval and gym Orientation.  Documentation of diagnosis can be found in [facility] Date: 12/17/2022 .

## 2023-01-10 ENCOUNTER — Encounter: Payer: Medicare Other | Admitting: *Deleted

## 2023-01-10 VITALS — Ht 64.9 in | Wt 147.5 lb

## 2023-01-10 DIAGNOSIS — Z955 Presence of coronary angioplasty implant and graft: Secondary | ICD-10-CM | POA: Diagnosis not present

## 2023-01-10 DIAGNOSIS — I214 Non-ST elevation (NSTEMI) myocardial infarction: Secondary | ICD-10-CM

## 2023-01-10 NOTE — Progress Notes (Signed)
Cardiac Individual Treatment Plan  Patient Details  Name: Anne Ross MRN: [id number] Date of Birth: Sep 30, 1941 Referring Provider:   Flowsheet Row Cardiac Rehab from 01/10/2023 in [facility] Cardiac and Pulmonary Rehab  Referring Provider Gregary Cromer MD       Initial Encounter Date:  Flowsheet Row Cardiac Rehab from 01/10/2023 in [facility] Cardiac and Pulmonary Rehab  Date 01/10/23       Visit Diagnosis: NSTEMI (non-ST elevation myocardial infarction) [facility])  Status post coronary artery stent placement  Patient's Home Medications on Admission:  Current Outpatient Medications:    ascorbic acid (VITAMIN C) 1000 MG tablet, Take 1,000 mg by mouth daily., Disp: , Rfl:    aspirin EC 81 MG tablet, Take 81 mg by mouth daily., Disp: , Rfl:    atorvastatin (LIPITOR) 80 MG tablet, Take 1 tablet by mouth daily., Disp: , Rfl:    Calcipotriene 0.005 % solution, 1 Application as needed., Disp: , Rfl:    Calcium Carbonate-Vitamin D 500-125 MG-UNIT TABS, Take 1 tablet by mouth daily at 10 pm., Disp: , Rfl:    Calcium Polycarbophil (FIBER) 625 MG TABS, Take 1 tablet by mouth daily., Disp: , Rfl:    cetirizine (ZYRTEC) 10 MG tablet, Take 10 mg by mouth daily., Disp: , Rfl:    chlorhexidine (PERIDEX) 0.12 % solution, Use as directed 15 mLs in the mouth or throat as needed., Disp: , Rfl:    Cholecalciferol (VITAMIN D) 2000 units tablet, Take 2,000 Units by mouth daily., Disp: , Rfl:    clopidogrel (PLAVIX) 75 MG tablet, Take 1 tablet by mouth daily., Disp: , Rfl:    fluticasone (FLONASE) 50 MCG/ACT nasal spray, Place into both nostrils daily., Disp: , Rfl:    folic acid (FOLVITE) [id number] MCG tablet, Take 400 mcg by mouth daily., Disp: , Rfl:    heparin 25000 UT/250ML infusion, Inject 1,000 Units/hr into the vein continuous. (Patient not taking: Reported on 01/06/2023), Disp: , Rfl:    metoprolol succinate (TOPROL-XL) 50 MG 24 hr tablet, TAKE 1 TABLET(50 MG) BY MOUTH EVERY DAY, Disp: , Rfl:    nitroGLYCERIN  (NITROSTAT) 0.4 MG SL tablet, Place 1 tablet under the tongue every 5 (five) minutes as needed for chest pain (up to 3 tab)., Disp: , Rfl:    nitroGLYCERIN 0.2 mg/mL infusion, Inject 0-200 mcg/min into the vein continuous. (Patient not taking: Reported on 01/06/2023), Disp: 250 mL, Rfl:    pantoprazole (PROTONIX) 40 MG tablet, Take 40 mg by mouth daily., Disp: , Rfl:    pimecrolimus (ELIDEL) 1 % cream, Apply 1 Application topically as needed., Disp: , Rfl:    pyridoxine (B-6) 100 MG tablet, Take 100 mg by mouth daily., Disp: , Rfl:    ramipril (ALTACE) 10 MG capsule, Take 10 mg by mouth daily., Disp: , Rfl:    trimethoprim (TRIMPEX) 100 MG tablet, Take 1 tablet (100 mg total) by mouth daily. (Patient not taking: Reported on 01/06/2023), Disp: 90 tablet, Rfl: 3   TURMERIC PO, Take 1 capsule by mouth as needed., Disp: , Rfl:    vitamin B-12 (CYANOCOBALAMIN) 250 MCG tablet, Take 250 mcg by mouth daily., Disp: , Rfl:   Past Medical History: Past Medical History:  Diagnosis Date   Basal cell carcinoma    chest    Cancer ([place])    uterine   History of COVID-19    Hypertension     Tobacco Use: Social History   Tobacco Use  Smoking Status Never  Smokeless Tobacco Never  Labs: Review Flowsheet        No data to display           Exercise Target Goals: Exercise Program Goal: Individual exercise prescription set using results from initial 6 min walk test and THRR while considering  patient's activity barriers and safety.   Exercise Prescription Goal: Initial exercise prescription builds to 30-45 minutes a day of aerobic activity, 2-3 days per week.  Home exercise guidelines will be given to patient during program as part of exercise prescription that the participant will acknowledge.   Education: Aerobic Exercise: - Group verbal and visual presentation on the components of exercise prescription. Introduces F.I.T.T principle from ACSM for exercise prescriptions.  Reviews F.I.T.T.  principles of aerobic exercise including progression. Written material given at graduation.   Education: Resistance Exercise: - Group verbal and visual presentation on the components of exercise prescription. Introduces F.I.T.T principle from ACSM for exercise prescriptions  Reviews F.I.T.T. principles of resistance exercise including progression. Written material given at graduation.    Education: Exercise & Equipment Safety: - Individual verbal instruction and demonstration of equipment use and safety with use of the equipment.   Education: Exercise Physiology & General Exercise Guidelines: - Group verbal and written instruction with models to review the exercise physiology of the cardiovascular system and associated critical values. Provides general exercise guidelines with specific guidelines to those with heart or lung disease.    Education: Flexibility, Balance, Mind/Body Relaxation: - Group verbal and visual presentation with interactive activity on the components of exercise prescription. Introduces F.I.T.T principle from ACSM for exercise prescriptions. Reviews F.I.T.T. principles of flexibility and balance exercise training including progression. Also discusses the mind body connection.  Reviews various relaxation techniques to help reduce and manage stress (i.e. Deep breathing, progressive muscle relaxation, and visualization). Balance handout provided to take home. Written material given at graduation.   Activity Barriers & Risk Stratification:  Activity Barriers & Cardiac Risk Stratification - 01/10/23 1405       Activity Barriers & Cardiac Risk Stratification   Activity Barriers Other (comment);Shortness of Breath;Joint Problems;Fibromyalgia;Arthritis;Deconditioning;Muscular Weakness;Balance Concerns    Cardiac Risk Stratification Moderate             6 Minute Walk:  6 Minute Walk     Row Name 01/10/23 1354         6 Minute Walk   Phase Initial     Distance 915  feet     Walk Time 6 minutes     # of Rest Breaks 0     MPH 1.73     METS 1.91     RPE 13     Perceived Dyspnea  2     VO2 Peak 6.69     Symptoms Yes (comment)     Comments fatigue, R hip pain (chronic) 6/10, SOB     Resting HR 55 bpm     Resting BP 126/64     Resting Oxygen Saturation  98 %     Exercise Oxygen Saturation  during 6 min walk 96 %     Max Ex. HR 88 bpm     Max Ex. BP 166/74     2 Minute Post BP 122/64              Oxygen Initial Assessment:   Oxygen Re-Evaluation:   Oxygen Discharge (Final Oxygen Re-Evaluation):   Initial Exercise Prescription:  Initial Exercise Prescription - 01/10/23 1400       Date of Initial Exercise RX  and Referring Provider   Date 01/10/23    Referring Provider Gregary Cromer MD      Oxygen   Maintain Oxygen Saturation 88% or higher      Treadmill   MPH 1.2    Grade 0.5    Minutes 15    METs 2      NuStep   Level 1    SPM 80    Minutes 15    METs 2      T5 Nustep   Level 1    SPM 80    Minutes 15    METs 2      Track   Laps 23    Minutes 15    METs 2.25      Prescription Details   Frequency (times per week) 3    Duration Progress to 30 minutes of continuous aerobic without signs/symptoms of physical distress      Intensity   THRR 40-80% of Max Heartrate 89-123    Ratings of Perceived Exertion 11-13    Perceived Dyspnea 0-4      Progression   Progression Continue to progress workloads to maintain intensity without signs/symptoms of physical distress.      Resistance Training   Training Prescription Yes    Weight 2 lb    Reps 10-15             Perform Capillary Blood Glucose checks as needed.  Exercise Prescription Changes:   Exercise Prescription Changes     Row Name 01/10/23 1400             Response to Exercise   Blood Pressure (Admit) 126/64       Blood Pressure (Exercise) 166/74       Blood Pressure (Exit) 122/64       Heart Rate (Admit) 55 bpm       Heart Rate (Exercise) 88  bpm       Heart Rate (Exit) 59 bpm       Oxygen Saturation (Admit) 98 %       Oxygen Saturation (Exercise) 96 %       Rating of Perceived Exertion (Exercise) 13       Perceived Dyspnea (Exercise) 2       Symptoms fatigue, R hip (chronic 6/10), SOB       Comments walk test results                Exercise Comments:   Exercise Goals and Review:   Exercise Goals     Row Name 01/10/23 1409             Exercise Goals   Increase Physical Activity Yes       Intervention Provide advice, education, support and counseling about physical activity/exercise needs.;Develop an individualized exercise prescription for aerobic and resistive training based on initial evaluation findings, risk stratification, comorbidities and participant's personal goals.       Expected Outcomes Short Term: Attend rehab on a regular basis to increase amount of physical activity.;Long Term: Add in home exercise to make exercise part of routine and to increase amount of physical activity.;Long Term: Exercising regularly at least 3-5 days a week.       Increase Strength and Stamina Yes       Intervention Provide advice, education, support and counseling about physical activity/exercise needs.;Develop an individualized exercise prescription for aerobic and resistive training based on initial evaluation findings, risk stratification, comorbidities and participant's personal goals.  Expected Outcomes Short Term: Increase workloads from initial exercise prescription for resistance, speed, and METs.;Short Term: Perform resistance training exercises routinely during rehab and add in resistance training at home;Long Term: Improve cardiorespiratory fitness, muscular endurance and strength as measured by increased METs and functional capacity (6MWT)       Able to understand and use rate of perceived exertion (RPE) scale Yes       Intervention Provide education and explanation on how to use RPE scale       Expected  Outcomes Short Term: Able to use RPE daily in rehab to express subjective intensity level;Long Term:  Able to use RPE to guide intensity level when exercising independently       Able to understand and use Dyspnea scale Yes       Intervention Provide education and explanation on how to use Dyspnea scale       Expected Outcomes Short Term: Able to use Dyspnea scale daily in rehab to express subjective sense of shortness of breath during exertion;Long Term: Able to use Dyspnea scale to guide intensity level when exercising independently       Knowledge and understanding of Target Heart Rate Range (THRR) Yes       Intervention Provide education and explanation of THRR including how the numbers were predicted and where they are located for reference       Expected Outcomes Short Term: Able to state/look up THRR;Short Term: Able to use daily as guideline for intensity in rehab;Long Term: Able to use THRR to govern intensity when exercising independently       Able to check pulse independently Yes       Intervention Provide education and demonstration on how to check pulse in carotid and radial arteries.;Review the importance of being able to check your own pulse for safety during independent exercise       Expected Outcomes Short Term: Able to explain why pulse checking is important during independent exercise;Long Term: Able to check pulse independently and accurately       Understanding of Exercise Prescription Yes       Intervention Provide education, explanation, and written materials on patient's individual exercise prescription       Expected Outcomes Short Term: Able to explain program exercise prescription;Long Term: Able to explain home exercise prescription to exercise independently                Exercise Goals Re-Evaluation :   Discharge Exercise Prescription (Final Exercise Prescription Changes):  Exercise Prescription Changes - 01/10/23 1400       Response to Exercise   Blood  Pressure (Admit) 126/64    Blood Pressure (Exercise) 166/74    Blood Pressure (Exit) 122/64    Heart Rate (Admit) 55 bpm    Heart Rate (Exercise) 88 bpm    Heart Rate (Exit) 59 bpm    Oxygen Saturation (Admit) 98 %    Oxygen Saturation (Exercise) 96 %    Rating of Perceived Exertion (Exercise) 13    Perceived Dyspnea (Exercise) 2    Symptoms fatigue, R hip (chronic 6/10), SOB    Comments walk test results             Nutrition:  Target Goals: Understanding of nutrition guidelines, daily intake of sodium <152m, cholesterol <2043m calories 30% from fat and 7% or less from saturated fats, daily to have 5 or more servings of fruits and vegetables.  Education: All About Nutrition: -Group instruction provided by verbal, written material,  interactive activities, discussions, models, and posters to present general guidelines for heart healthy nutrition including fat, fiber, MyPlate, the role of sodium in heart healthy nutrition, utilization of the nutrition label, and utilization of this knowledge for meal planning. Follow up email sent as well. Written material given at graduation.   Biometrics:  Pre Biometrics - 01/10/23 1410       Pre Biometrics   Height 5' 4.9" (1.648 m)    Weight 147 lb 8 oz (66.9 kg)    Waist Circumference 33 inches    Hip Circumference 39.5 inches    Waist to Hip Ratio 0.84 %    BMI (Calculated) 24.63    Single Leg Stand 1.9 seconds              Nutrition Therapy Plan and Nutrition Goals:  Nutrition Therapy & Goals - 01/10/23 1411       Intervention Plan   Intervention Prescribe, educate and counsel regarding individualized specific dietary modifications aiming towards targeted core components such as weight, hypertension, lipid management, diabetes, heart failure and other comorbidities.    Expected Outcomes Short Term Goal: Understand basic principles of dietary content, such as calories, fat, sodium, cholesterol and nutrients.;Short Term Goal:  A plan has been developed with personal nutrition goals set during dietitian appointment.;Long Term Goal: Adherence to prescribed nutrition plan.             Nutrition Assessments:  MEDIFICTS Score Key: ?70 Need to make dietary changes  40-70 Heart Healthy Diet ? 40 Therapeutic Level Cholesterol Diet  Flowsheet Row Cardiac Rehab from 01/10/2023 in [facility] Cardiac and Pulmonary Rehab  Picture Your Plate Total Score on Admission 68      Picture Your Plate Scores: [id number] Unhealthy dietary pattern with much room for improvement. 41-50 Dietary pattern unlikely to meet recommendations for good health and room for improvement. 51-60 More healthful dietary pattern, with some room for improvement.  >60 Healthy dietary pattern, although there may be some specific behaviors that could be improved.    Nutrition Goals Re-Evaluation:   Nutrition Goals Discharge (Final Nutrition Goals Re-Evaluation):   Psychosocial: Target Goals: Acknowledge presence or absence of significant depression and/or stress, maximize coping skills, provide positive support system. Participant is able to verbalize types and ability to use techniques and skills needed for reducing stress and depression.   Education: Stress, Anxiety, and Depression - Group verbal and visual presentation to define topics covered.  Reviews how body is impacted by stress, anxiety, and depression.  Also discusses healthy ways to reduce stress and to treat/manage anxiety and depression.  Written material given at graduation.   Education: Sleep Hygiene -Provides group verbal and written instruction about how sleep can affect your health.  Define sleep hygiene, discuss sleep cycles and impact of sleep habits. Review good sleep hygiene tips.    Initial Review & Psychosocial Screening:  Initial Psych Review & Screening - 01/06/23 1552       Initial Review   Current issues with None Identified      Family Dynamics   Good Support System?  Yes   husband  and  daughter     Barriers   Psychosocial barriers to participate in program There are no identifiable barriers or psychosocial needs.      Screening Interventions   Interventions Encouraged to exercise;To provide support and resources with identified psychosocial needs;Provide feedback about the scores to participant    Expected Outcomes Short Term goal: Utilizing psychosocial counselor, staff and physician to assist  with identification of specific Stressors or current issues interfering with healing process. Setting desired goal for each stressor or current issue identified.;Long Term Goal: Stressors or current issues are controlled or eliminated.;Short Term goal: Identification and review with participant of any Quality of Life or Depression concerns found by scoring the questionnaire.;Long Term goal: The participant improves quality of Life and PHQ9 Scores as seen by post scores and/or verbalization of changes             Quality of Life Scores:   Quality of Life - 01/10/23 1410       Quality of Life   Select Quality of Life      Quality of Life Scores   Health/Function Pre 22.4 %    Socioeconomic Pre 30 %    Psych/Spiritual Pre 26.57 %    Family Pre 30 %    GLOBAL Pre 25.94 %            Scores of 19 and below usually indicate a poorer quality of life in these areas.  A difference of  2-3 points is a clinically meaningful difference.  A difference of 2-3 points in the total score of the Quality of Life Index has been associated with significant improvement in overall quality of life, self-image, physical symptoms, and general health in studies assessing change in quality of life.  PHQ-9: Review Flowsheet       01/10/2023  Depression screen PHQ 2/9  Decreased Interest 0  Down, Depressed, Hopeless 0  PHQ - 2 Score 0  Altered sleeping 0  Tired, decreased energy 3  Change in appetite 2  Feeling bad or failure about yourself  0  Trouble concentrating 0   Moving slowly or fidgety/restless 2  Suicidal thoughts 0  PHQ-9 Score 7  Difficult doing work/chores Somewhat difficult   Interpretation of Total Score  Total Score Depression Severity:  1-4 = Minimal depression, 5-9 = Mild depression, 10-14 = Moderate depression, 15-19 = Moderately severe depression, 20-27 = Severe depression   Psychosocial Evaluation and Intervention:  Psychosocial Evaluation - 01/06/23 1602       Psychosocial Evaluation & Interventions   Interventions Encouraged to exercise with the program and follow exercise prescription    Comments Anne Ross has no barriers to attending the program. She lives with her husband and her duaghter is working at home form their home at this time. Her support is her husband and her daughter.   She is ready to get started with the program.    Expected Outcomes STG  Anne Ross is able to attend all scheduled sessions.  She is able to progress with her exercise . LTG Anne Ross continues her exercise progression after discharge    Continue Psychosocial Services  Follow up required by staff             Psychosocial Re-Evaluation:   Psychosocial Discharge (Final Psychosocial Re-Evaluation):   Vocational Rehabilitation: Provide vocational rehab assistance to qualifying candidates.   Vocational Rehab Evaluation & Intervention:  Vocational Rehab - 01/06/23 1557       Initial Vocational Rehab Evaluation & Intervention   Assessment shows need for Vocational Rehabilitation No      Vocational Rehab Re-Evaulation   Comments retired             Education: Education Goals: Education classes will be provided on a variety of topics geared toward better understanding of heart health and risk factor modification. Participant will state understanding/return demonstration of topics presented as noted by education  test scores.  Learning Barriers/Preferences:  Learning Barriers/Preferences - 01/06/23 1556       Learning  Barriers/Preferences   Learning Barriers None    Learning Preferences None             General Cardiac Education Topics:  AED/CPR: - Group verbal and written instruction with the use of models to demonstrate the basic use of the AED with the basic ABC's of resuscitation.   Anatomy and Cardiac Procedures: - Group verbal and visual presentation and models provide information about basic cardiac anatomy and function. Reviews the testing methods done to diagnose heart disease and the outcomes of the test results. Describes the treatment choices: Medical Management, Angioplasty, or Coronary Bypass Surgery for treating various heart conditions including Myocardial Infarction, Angina, Valve Disease, and Cardiac Arrhythmias.  Written material given at graduation.   Medication Safety: - Group verbal and visual instruction to review commonly prescribed medications for heart and lung disease. Reviews the medication, class of the drug, and side effects. Includes the steps to properly store meds and maintain the prescription regimen.  Written material given at graduation.   Intimacy: - Group verbal instruction through game format to discuss how heart and lung disease can affect sexual intimacy. Written material given at graduation..   Know Your Numbers and Heart Failure: - Group verbal and visual instruction to discuss disease risk factors for cardiac and pulmonary disease and treatment options.  Reviews associated critical values for Overweight/Obesity, Hypertension, Cholesterol, and Diabetes.  Discusses basics of heart failure: signs/symptoms and treatments.  Introduces Heart Failure Zone chart for action plan for heart failure.  Written material given at graduation.   Infection Prevention: - Provides verbal and written material to individual with discussion of infection control including proper hand washing and proper equipment cleaning during exercise session.   Falls Prevention: -  Provides verbal and written material to individual with discussion of falls prevention and safety. Flowsheet Row Cardiac Rehab from 01/06/2023 in [facility] Cardiac and Pulmonary Rehab  Date 01/06/23  Educator SB  Instruction Review Code 1- Verbalizes Understanding       Other: -Provides group and verbal instruction on various topics (see comments)   Knowledge Questionnaire Score:  Knowledge Questionnaire Score - 01/10/23 1411       Knowledge Questionnaire Score   Pre Score 19/26             Core Components/Risk Factors/Patient Goals at Admission:  Personal Goals and Risk Factors at Admission - 01/10/23 1411       Core Components/Risk Factors/Patient Goals on Admission    Weight Management Yes;Obesity;Weight Loss    Intervention Weight Management: Develop a combined nutrition and exercise program designed to reach desired caloric intake, while maintaining appropriate intake of nutrient and fiber, sodium and fats, and appropriate energy expenditure required for the weight goal.;Weight Management: Provide education and appropriate resources to help participant work on and attain dietary goals.;Weight Management/Obesity: Establish reasonable short term and long term weight goals.;Obesity: Provide education and appropriate resources to help participant work on and attain dietary goals.    Admit Weight 147 lb 8 oz (66.9 kg)    Goal Weight: Short Term 143 lb (64.9 kg)    Goal Weight: Long Term 140 lb (63.5 kg)    Expected Outcomes Short Term: Continue to assess and modify interventions until short term weight is achieved;Long Term: Adherence to nutrition and physical activity/exercise program aimed toward attainment of established weight goal;Weight Loss: Understanding of general recommendations for a balanced deficit  meal plan, which promotes 1-2 lb weight loss per week and includes a negative energy balance of [id number] kcal/d;Understanding recommendations for meals to include 15-35% energy as  protein, 25-35% energy from fat, 35-60% energy from carbohydrates, less than 261m of dietary cholesterol, 20-35 gm of total fiber daily;Understanding of distribution of calorie intake throughout the day with the consumption of 4-5 meals/snacks    Diabetes Yes    Intervention Provide education about signs/symptoms and action to take for hypo/hyperglycemia.;Provide education about proper nutrition, including hydration, and aerobic/resistive exercise prescription along with prescribed medications to achieve blood glucose in normal ranges: Fasting glucose 65-99 mg/dL    Expected Outcomes Short Term: Participant verbalizes understanding of the signs/symptoms and immediate care of hyper/hypoglycemia, proper foot care and importance of medication, aerobic/resistive exercise and nutrition plan for blood glucose control.;Long Term: Attainment of HbA1C < 7%.    Hypertension Yes    Intervention Provide education on lifestyle modifcations including regular physical activity/exercise, weight management, moderate sodium restriction and increased consumption of fresh fruit, vegetables, and low fat dairy, alcohol moderation, and smoking cessation.;Monitor prescription use compliance.    Expected Outcomes Short Term: Continued assessment and intervention until BP is < 140/937mHG in hypertensive participants. < 130/8010mG in hypertensive participants with diabetes, heart failure or chronic kidney disease.;Long Term: Maintenance of blood pressure at goal levels.    Lipids Yes    Intervention Provide education and support for participant on nutrition & aerobic/resistive exercise along with prescribed medications to achieve LDL <83m55mDL >40mg77m Expected Outcomes Short Term: Participant states understanding of desired cholesterol values and is compliant with medications prescribed. Participant is following exercise prescription and nutrition guidelines.;Long Term: Cholesterol controlled with medications as prescribed,  with individualized exercise RX and with personalized nutrition plan. Value goals: LDL < 83mg,76m > 40 mg.             Education:Diabetes - Individual verbal and written instruction to review signs/symptoms of diabetes, desired ranges of glucose level fasting, after meals and with exercise. Acknowledge that pre and post exercise glucose checks will be done for 3 sessions at entry of program.   Core Components/Risk Factors/Patient Goals Review:    Core Components/Risk Factors/Patient Goals at Discharge (Final Review):    ITP Comments:  ITP Comments     Row Name 01/06/23 1601 01/10/23 1352         ITP Comments Virtual orientation call completed today. shehas an appointment on Date: 01/10/2023  for EP eval and gym Orientation.  [facility] . Completed 6MWT and gym orientation. Initial ITP created and sent for review to Dr. Mark MEmily Filbertcal Director.               Comments: Initial ITP

## 2023-01-10 NOTE — Patient Instructions (Signed)
Patient Instructions  Patient Details  Name: Anne Ross MRN: [id number] Date of Birth: February 27, 1941 Referring Provider:  Kristopher Glee, MD  Below are your personal goals for exercise, nutrition, and risk factors. Our goal is to help you stay on track towards obtaining and maintaining these goals. We will be discussing your progress on these goals with you throughout the program.  Initial Exercise Prescription:  Initial Exercise Prescription - 01/10/23 1400       Date of Initial Exercise RX and Referring Provider   Date 01/10/23    Referring Provider Gregary Cromer MD      Oxygen   Maintain Oxygen Saturation 88% or higher      Treadmill   MPH 1.2    Grade 0.5    Minutes 15    METs 2      NuStep   Level 1    SPM 80    Minutes 15    METs 2      T5 Nustep   Level 1    SPM 80    Minutes 15    METs 2      Track   Laps 23    Minutes 15    METs 2.25      Prescription Details   Frequency (times per week) 3    Duration Progress to 30 minutes of continuous aerobic without signs/symptoms of physical distress      Intensity   THRR 40-80% of Max Heartrate 89-123    Ratings of Perceived Exertion 11-13    Perceived Dyspnea 0-4      Progression   Progression Continue to progress workloads to maintain intensity without signs/symptoms of physical distress.      Resistance Training   Training Prescription Yes    Weight 2 lb    Reps 10-15             Exercise Goals: Frequency: Be able to perform aerobic exercise two to three times per week in program working toward 2-5 days per week of home exercise.  Intensity: Work with a perceived exertion of 11 (fairly light) - 15 (hard) while following your exercise prescription.  We will make changes to your prescription with you as you progress through the program.   Duration: Be able to do 30 to 45 minutes of continuous aerobic exercise in addition to a 5 minute warm-up and a 5 minute cool-down routine.   Nutrition  Goals: Your personal nutrition goals will be established when you do your nutrition analysis with the dietician.  The following are general nutrition guidelines to follow: Cholesterol < 257m/day Sodium < 1506mday Fiber: Women over 50 yrs - 21 grams per day  Personal Goals:  Personal Goals and Risk Factors at Admission - 01/10/23 1411       Core Components/Risk Factors/Patient Goals on Admission    Weight Management Yes;Obesity;Weight Loss    Intervention Weight Management: Develop a combined nutrition and exercise program designed to reach desired caloric intake, while maintaining appropriate intake of nutrient and fiber, sodium and fats, and appropriate energy expenditure required for the weight goal.;Weight Management: Provide education and appropriate resources to help participant work on and attain dietary goals.;Weight Management/Obesity: Establish reasonable short term and long term weight goals.;Obesity: Provide education and appropriate resources to help participant work on and attain dietary goals.    Admit Weight 147 lb 8 oz (66.9 kg)    Goal Weight: Short Term 143 lb (64.9 kg)    Goal Weight: Long Term  140 lb (63.5 kg)    Expected Outcomes Short Term: Continue to assess and modify interventions until short term weight is achieved;Long Term: Adherence to nutrition and physical activity/exercise program aimed toward attainment of established weight goal;Weight Loss: Understanding of general recommendations for a balanced deficit meal plan, which promotes 1-2 lb weight loss per week and includes a negative energy balance of [phone] kcal/d;Understanding recommendations for meals to include 15-35% energy as protein, 25-35% energy from fat, 35-60% energy from carbohydrates, less than 244m of dietary cholesterol, 20-35 gm of total fiber daily;Understanding of distribution of calorie intake throughout the day with the consumption of 4-5 meals/snacks    Diabetes Yes    Intervention Provide  education about signs/symptoms and action to take for hypo/hyperglycemia.;Provide education about proper nutrition, including hydration, and aerobic/resistive exercise prescription along with prescribed medications to achieve blood glucose in normal ranges: Fasting glucose 65-99 mg/dL    Expected Outcomes Short Term: Participant verbalizes understanding of the signs/symptoms and immediate care of hyper/hypoglycemia, proper foot care and importance of medication, aerobic/resistive exercise and nutrition plan for blood glucose control.;Long Term: Attainment of HbA1C < 7%.    Hypertension Yes    Intervention Provide education on lifestyle modifcations including regular physical activity/exercise, weight management, moderate sodium restriction and increased consumption of fresh fruit, vegetables, and low fat dairy, alcohol moderation, and smoking cessation.;Monitor prescription use compliance.    Expected Outcomes Short Term: Continued assessment and intervention until BP is < 140/94mHG in hypertensive participants. < 130/8044mG in hypertensive participants with diabetes, heart failure or chronic kidney disease.;Long Term: Maintenance of blood pressure at goal levels.    Lipids Yes    Intervention Provide education and support for participant on nutrition & aerobic/resistive exercise along with prescribed medications to achieve LDL <34m24mDL >40mg33m Expected Outcomes Short Term: Participant states understanding of desired cholesterol values and is compliant with medications prescribed. Participant is following exercise prescription and nutrition guidelines.;Long Term: Cholesterol controlled with medications as prescribed, with individualized exercise RX and with personalized nutrition plan. Value goals: LDL < 34mg,52m > 40 mg.             Tobacco Use Initial Evaluation: Social History   Tobacco Use  Smoking Status Never  Smokeless Tobacco Never    Exercise Goals and Review:  Exercise Goals      Row Name 01/10/23 1409             Exercise Goals   Increase Physical Activity Yes       Intervention Provide advice, education, support and counseling about physical activity/exercise needs.;Develop an individualized exercise prescription for aerobic and resistive training based on initial evaluation findings, risk stratification, comorbidities and participant's personal goals.       Expected Outcomes Short Term: Attend rehab on a regular basis to increase amount of physical activity.;Long Term: Add in home exercise to make exercise part of routine and to increase amount of physical activity.;Long Term: Exercising regularly at least 3-5 days a week.       Increase Strength and Stamina Yes       Intervention Provide advice, education, support and counseling about physical activity/exercise needs.;Develop an individualized exercise prescription for aerobic and resistive training based on initial evaluation findings, risk stratification, comorbidities and participant's personal goals.       Expected Outcomes Short Term: Increase workloads from initial exercise prescription for resistance, speed, and METs.;Short Term: Perform resistance training exercises routinely during rehab and add in resistance training  at home;Long Term: Improve cardiorespiratory fitness, muscular endurance and strength as measured by increased METs and functional capacity (6MWT)       Able to understand and use rate of perceived exertion (RPE) scale Yes       Intervention Provide education and explanation on how to use RPE scale       Expected Outcomes Short Term: Able to use RPE daily in rehab to express subjective intensity level;Long Term:  Able to use RPE to guide intensity level when exercising independently       Able to understand and use Dyspnea scale Yes       Intervention Provide education and explanation on how to use Dyspnea scale       Expected Outcomes Short Term: Able to use Dyspnea scale daily in rehab to  express subjective sense of shortness of breath during exertion;Long Term: Able to use Dyspnea scale to guide intensity level when exercising independently       Knowledge and understanding of Target Heart Rate Range (THRR) Yes       Intervention Provide education and explanation of THRR including how the numbers were predicted and where they are located for reference       Expected Outcomes Short Term: Able to state/look up THRR;Short Term: Able to use daily as guideline for intensity in rehab;Long Term: Able to use THRR to govern intensity when exercising independently       Able to check pulse independently Yes       Intervention Provide education and demonstration on how to check pulse in carotid and radial arteries.;Review the importance of being able to check your own pulse for safety during independent exercise       Expected Outcomes Short Term: Able to explain why pulse checking is important during independent exercise;Long Term: Able to check pulse independently and accurately       Understanding of Exercise Prescription Yes       Intervention Provide education, explanation, and written materials on patient's individual exercise prescription       Expected Outcomes Short Term: Able to explain program exercise prescription;Long Term: Able to explain home exercise prescription to exercise independently              Copy of goals given to participant.

## 2023-01-12 ENCOUNTER — Encounter: Payer: Medicare Other | Admitting: *Deleted

## 2023-01-12 DIAGNOSIS — I214 Non-ST elevation (NSTEMI) myocardial infarction: Secondary | ICD-10-CM

## 2023-01-12 DIAGNOSIS — Z955 Presence of coronary angioplasty implant and graft: Secondary | ICD-10-CM

## 2023-01-12 NOTE — Progress Notes (Signed)
Daily Session Note  Patient Details  Name: Anne Ross MRN: [id number] Date of Birth: 1941/03/18 Referring Provider:   Flowsheet Row Cardiac Rehab from 01/10/2023 in [facility] Cardiac and Pulmonary Rehab  Referring Provider Gregary Cromer MD       Encounter Date: 01/12/2023  Check In:  Session Check In - 01/12/23 1004       Check-In   Supervising physician immediately available to respond to emergencies See telemetry face sheet for immediately available ER MD    Location [facility]-Cardiac & Pulmonary Rehab    Staff Present Darlyne Russian, RN, ADN;Joseph Poole, RCP,RRT,BSRT;Noah Tickle, BS, Exercise Physiologist;Jessica Hastings, MA, RCEP, CCRP, CCET    Virtual Visit No    Medication changes reported     No    Fall or balance concerns reported    No    Warm-up and Cool-down Performed on first and last piece of equipment    Resistance Training Performed Yes    VAD Patient? No    PAD/SET Patient? No      Pain Assessment   Currently in Pain? No/denies                Social History   Tobacco Use  Smoking Status Never  Smokeless Tobacco Never    Goals Met:  Independence with exercise equipment Exercise tolerated well No report of concerns or symptoms today Strength training completed today  Goals Unmet:  Not Applicable  Comments: First full day of exercise!  Patient was oriented to gym and equipment including functions, settings, policies, and procedures.  Patient's individual exercise prescription and treatment plan were reviewed.  All starting workloads were established based on the results of the 6 minute walk test done at initial orientation visit.  The plan for exercise progression was also introduced and progression will be customized based on patient's performance and goals.    Dr. Emily Filbert is Medical Director for [place].  Dr. Ottie Glazier is Medical Director for [facility] Pulmonary Rehabilitation.

## 2023-01-14 ENCOUNTER — Encounter: Payer: Medicare Other | Admitting: *Deleted

## 2023-01-17 ENCOUNTER — Encounter: Payer: Medicare Other | Admitting: *Deleted

## 2023-01-17 DIAGNOSIS — Z955 Presence of coronary angioplasty implant and graft: Secondary | ICD-10-CM

## 2023-01-17 DIAGNOSIS — I214 Non-ST elevation (NSTEMI) myocardial infarction: Secondary | ICD-10-CM | POA: Diagnosis not present

## 2023-01-17 NOTE — Progress Notes (Signed)
Daily Session Note  Patient Details  Name: Anne Ross MRN: [id number] Date of Birth: November 20, 1941 Referring Provider:   Flowsheet Row Cardiac Rehab from 01/10/2023 in [facility] Cardiac and Pulmonary Rehab  Referring Provider Gregary Cromer MD       Encounter Date: 01/17/2023  Check In:  Session Check In - 01/17/23 0938       Check-In   Supervising physician immediately available to respond to emergencies See telemetry face sheet for immediately available ER MD    Location [facility]-Cardiac & Pulmonary Rehab    Staff Present Darlyne Russian, RN, Doyce Para, BS, ACSM CEP, Exercise Physiologist;Noah Tickle, BS, Exercise Physiologist    Virtual Visit No    Medication changes reported     No    Fall or balance concerns reported    No    Warm-up and Cool-down Performed on first and last piece of equipment    Resistance Training Performed Yes    VAD Patient? No    PAD/SET Patient? No      Pain Assessment   Currently in Pain? No/denies                Social History   Tobacco Use  Smoking Status Never  Smokeless Tobacco Never    Goals Met:  Independence with exercise equipment Exercise tolerated well No report of concerns or symptoms today Strength training completed today  Goals Unmet:  Not Applicable  Comments: Pt able to follow exercise prescription today without complaint.  Will continue to monitor for progression.    Dr. Emily Filbert is Medical Director for [place].  Dr. Ottie Glazier is Medical Director for [facility] Pulmonary Rehabilitation.

## 2023-01-19 ENCOUNTER — Encounter: Payer: Medicare Other | Admitting: *Deleted

## 2023-01-19 ENCOUNTER — Encounter: Payer: Self-pay | Admitting: *Deleted

## 2023-01-19 DIAGNOSIS — Z955 Presence of coronary angioplasty implant and graft: Secondary | ICD-10-CM

## 2023-01-19 DIAGNOSIS — I214 Non-ST elevation (NSTEMI) myocardial infarction: Secondary | ICD-10-CM

## 2023-01-19 NOTE — Progress Notes (Signed)
Cardiac Individual Treatment Plan  Patient Details  Name: Anne Ross MRN: [id number] Date of Birth: 1941/09/27 Referring Provider:   Flowsheet Row Cardiac Rehab from 01/10/2023 in [facility] Cardiac and Pulmonary Rehab  Referring Provider Gregary Cromer MD       Initial Encounter Date:  Flowsheet Row Cardiac Rehab from 01/10/2023 in [facility] Cardiac and Pulmonary Rehab  Date 01/10/23       Visit Diagnosis: NSTEMI (non-ST elevation myocardial infarction) [facility])  Status post coronary artery stent placement  Patient's Home Medications on Admission:  Current Outpatient Medications:    ascorbic acid (VITAMIN C) 1000 MG tablet, Take 1,000 mg by mouth daily., Disp: , Rfl:    aspirin EC 81 MG tablet, Take 81 mg by mouth daily., Disp: , Rfl:    atorvastatin (LIPITOR) 80 MG tablet, Take 1 tablet by mouth daily., Disp: , Rfl:    Calcipotriene 0.005 % solution, 1 Application as needed., Disp: , Rfl:    Calcium Carbonate-Vitamin D 500-125 MG-UNIT TABS, Take 1 tablet by mouth daily at 10 pm., Disp: , Rfl:    Calcium Polycarbophil (FIBER) 625 MG TABS, Take 1 tablet by mouth daily., Disp: , Rfl:    cetirizine (ZYRTEC) 10 MG tablet, Take 10 mg by mouth daily., Disp: , Rfl:    chlorhexidine (PERIDEX) 0.12 % solution, Use as directed 15 mLs in the mouth or throat as needed., Disp: , Rfl:    Cholecalciferol (VITAMIN D) 2000 units tablet, Take 2,000 Units by mouth daily., Disp: , Rfl:    clopidogrel (PLAVIX) 75 MG tablet, Take 1 tablet by mouth daily., Disp: , Rfl:    fluticasone (FLONASE) 50 MCG/ACT nasal spray, Place into both nostrils daily., Disp: , Rfl:    folic acid (FOLVITE) [id number] MCG tablet, Take 400 mcg by mouth daily., Disp: , Rfl:    heparin 25000 UT/250ML infusion, Inject 1,000 Units/hr into the vein continuous. (Patient not taking: Reported on 01/06/2023), Disp: , Rfl:    metoprolol succinate (TOPROL-XL) 50 MG 24 hr tablet, TAKE 1 TABLET(50 MG) BY MOUTH EVERY DAY, Disp: , Rfl:    nitroGLYCERIN  (NITROSTAT) 0.4 MG SL tablet, Place 1 tablet under the tongue every 5 (five) minutes as needed for chest pain (up to 3 tab)., Disp: , Rfl:    nitroGLYCERIN 0.2 mg/mL infusion, Inject 0-200 mcg/min into the vein continuous. (Patient not taking: Reported on 01/06/2023), Disp: 250 mL, Rfl:    pantoprazole (PROTONIX) 40 MG tablet, Take 40 mg by mouth daily., Disp: , Rfl:    pimecrolimus (ELIDEL) 1 % cream, Apply 1 Application topically as needed., Disp: , Rfl:    pyridoxine (B-6) 100 MG tablet, Take 100 mg by mouth daily., Disp: , Rfl:    ramipril (ALTACE) 10 MG capsule, Take 10 mg by mouth daily., Disp: , Rfl:    trimethoprim (TRIMPEX) 100 MG tablet, Take 1 tablet (100 mg total) by mouth daily. (Patient not taking: Reported on 01/06/2023), Disp: 90 tablet, Rfl: 3   TURMERIC PO, Take 1 capsule by mouth as needed., Disp: , Rfl:    vitamin B-12 (CYANOCOBALAMIN) 250 MCG tablet, Take 250 mcg by mouth daily., Disp: , Rfl:   Past Medical History: Past Medical History:  Diagnosis Date   Basal cell carcinoma    chest    Cancer ([place])    uterine   History of COVID-19    Hypertension     Tobacco Use: Social History   Tobacco Use  Smoking Status Never  Smokeless Tobacco Never  Labs: Review Flowsheet        No data to display           Exercise Target Goals: Exercise Program Goal: Individual exercise prescription set using results from initial 6 min walk test and THRR while considering  patient's activity barriers and safety.   Exercise Prescription Goal: Initial exercise prescription builds to 30-45 minutes a day of aerobic activity, 2-3 days per week.  Home exercise guidelines will be given to patient during program as part of exercise prescription that the participant will acknowledge.   Education: Aerobic Exercise: - Group verbal and visual presentation on the components of exercise prescription. Introduces F.I.T.T principle from ACSM for exercise prescriptions.  Reviews F.I.T.T.  principles of aerobic exercise including progression. Written material given at graduation.   Education: Resistance Exercise: - Group verbal and visual presentation on the components of exercise prescription. Introduces F.I.T.T principle from ACSM for exercise prescriptions  Reviews F.I.T.T. principles of resistance exercise including progression. Written material given at graduation.    Education: Exercise & Equipment Safety: - Individual verbal instruction and demonstration of equipment use and safety with use of the equipment.   Education: Exercise Physiology & General Exercise Guidelines: - Group verbal and written instruction with models to review the exercise physiology of the cardiovascular system and associated critical values. Provides general exercise guidelines with specific guidelines to those with heart or lung disease.    Education: Flexibility, Balance, Mind/Body Relaxation: - Group verbal and visual presentation with interactive activity on the components of exercise prescription. Introduces F.I.T.T principle from ACSM for exercise prescriptions. Reviews F.I.T.T. principles of flexibility and balance exercise training including progression. Also discusses the mind body connection.  Reviews various relaxation techniques to help reduce and manage stress (i.e. Deep breathing, progressive muscle relaxation, and visualization). Balance handout provided to take home. Written material given at graduation.   Activity Barriers & Risk Stratification:  Activity Barriers & Cardiac Risk Stratification - 01/10/23 1405       Activity Barriers & Cardiac Risk Stratification   Activity Barriers Other (comment);Shortness of Breath;Joint Problems;Fibromyalgia;Arthritis;Deconditioning;Muscular Weakness;Balance Concerns    Cardiac Risk Stratification Moderate             6 Minute Walk:  6 Minute Walk     Row Name 01/10/23 1354         6 Minute Walk   Phase Initial     Distance 915  feet     Walk Time 6 minutes     # of Rest Breaks 0     MPH 1.73     METS 1.91     RPE 13     Perceived Dyspnea  2     VO2 Peak 6.69     Symptoms Yes (comment)     Comments fatigue, R hip pain (chronic) 6/10, SOB     Resting HR 55 bpm     Resting BP 126/64     Resting Oxygen Saturation  98 %     Exercise Oxygen Saturation  during 6 min walk 96 %     Max Ex. HR 88 bpm     Max Ex. BP 166/74     2 Minute Post BP 122/64              Oxygen Initial Assessment:   Oxygen Re-Evaluation:   Oxygen Discharge (Final Oxygen Re-Evaluation):   Initial Exercise Prescription:  Initial Exercise Prescription - 01/10/23 1400       Date of Initial Exercise RX  and Referring Provider   Date 01/10/23    Referring Provider Gregary Cromer MD      Oxygen   Maintain Oxygen Saturation 88% or higher      Treadmill   MPH 1.2    Grade 0.5    Minutes 15    METs 2      NuStep   Level 1    SPM 80    Minutes 15    METs 2      T5 Nustep   Level 1    SPM 80    Minutes 15    METs 2      Track   Laps 23    Minutes 15    METs 2.25      Prescription Details   Frequency (times per week) 3    Duration Progress to 30 minutes of continuous aerobic without signs/symptoms of physical distress      Intensity   THRR 40-80% of Max Heartrate 89-123    Ratings of Perceived Exertion 11-13    Perceived Dyspnea 0-4      Progression   Progression Continue to progress workloads to maintain intensity without signs/symptoms of physical distress.      Resistance Training   Training Prescription Yes    Weight 2 lb    Reps 10-15             Perform Capillary Blood Glucose checks as needed.  Exercise Prescription Changes:   Exercise Prescription Changes     Row Name 01/10/23 1400 01/17/23 1300           Response to Exercise   Blood Pressure (Admit) 126/64 142/78      Blood Pressure (Exercise) 166/74 138/62      Blood Pressure (Exit) 122/64 122/62      Heart Rate (Admit) 55 bpm 63  bpm      Heart Rate (Exercise) 88 bpm 99 bpm      Heart Rate (Exit) 59 bpm 54 bpm      Oxygen Saturation (Admit) 98 % --      Oxygen Saturation (Exercise) 96 % --      Rating of Perceived Exertion (Exercise) 13 14      Perceived Dyspnea (Exercise) 2 --      Symptoms fatigue, R hip (chronic 6/10), SOB none      Comments walk test results 2nd full day of exercise      Duration -- Progress to 30 minutes of  aerobic without signs/symptoms of physical distress      Intensity -- THRR unchanged        Progression   Progression -- Continue to progress workloads to maintain intensity without signs/symptoms of physical distress.      Average METs -- 2.38        Resistance Training   Training Prescription -- Yes      Weight -- 2 lb      Reps -- 10-15        Interval Training   Interval Training -- No        Treadmill   MPH -- 2.5      Grade -- 0      Minutes -- 15      METs -- 2.91        NuStep   Level -- 3      Minutes -- 15        Oxygen   Maintain Oxygen Saturation -- 88% or higher  Exercise Comments:   Exercise Comments     Row Name 01/12/23 1005           Exercise Comments First full day of exercise!  Patient was oriented to gym and equipment including functions, settings, policies, and procedures.  Patient's individual exercise prescription and treatment plan were reviewed.  All starting workloads were established based on the results of the 6 minute walk test done at initial orientation visit.  The plan for exercise progression was also introduced and progression will be customized based on patient's performance and goals.                Exercise Goals and Review:   Exercise Goals     Row Name 01/10/23 1409             Exercise Goals   Increase Physical Activity Yes       Intervention Provide advice, education, support and counseling about physical activity/exercise needs.;Develop an individualized exercise prescription for aerobic and  resistive training based on initial evaluation findings, risk stratification, comorbidities and participant's personal goals.       Expected Outcomes Short Term: Attend rehab on a regular basis to increase amount of physical activity.;Long Term: Add in home exercise to make exercise part of routine and to increase amount of physical activity.;Long Term: Exercising regularly at least 3-5 days a week.       Increase Strength and Stamina Yes       Intervention Provide advice, education, support and counseling about physical activity/exercise needs.;Develop an individualized exercise prescription for aerobic and resistive training based on initial evaluation findings, risk stratification, comorbidities and participant's personal goals.       Expected Outcomes Short Term: Increase workloads from initial exercise prescription for resistance, speed, and METs.;Short Term: Perform resistance training exercises routinely during rehab and add in resistance training at home;Long Term: Improve cardiorespiratory fitness, muscular endurance and strength as measured by increased METs and functional capacity (6MWT)       Able to understand and use rate of perceived exertion (RPE) scale Yes       Intervention Provide education and explanation on how to use RPE scale       Expected Outcomes Short Term: Able to use RPE daily in rehab to express subjective intensity level;Long Term:  Able to use RPE to guide intensity level when exercising independently       Able to understand and use Dyspnea scale Yes       Intervention Provide education and explanation on how to use Dyspnea scale       Expected Outcomes Short Term: Able to use Dyspnea scale daily in rehab to express subjective sense of shortness of breath during exertion;Long Term: Able to use Dyspnea scale to guide intensity level when exercising independently       Knowledge and understanding of Target Heart Rate Range (THRR) Yes       Intervention Provide education and  explanation of THRR including how the numbers were predicted and where they are located for reference       Expected Outcomes Short Term: Able to state/look up THRR;Short Term: Able to use daily as guideline for intensity in rehab;Long Term: Able to use THRR to govern intensity when exercising independently       Able to check pulse independently Yes       Intervention Provide education and demonstration on how to check pulse in carotid and radial arteries.;Review the importance of being able to check your  own pulse for safety during independent exercise       Expected Outcomes Short Term: Able to explain why pulse checking is important during independent exercise;Long Term: Able to check pulse independently and accurately       Understanding of Exercise Prescription Yes       Intervention Provide education, explanation, and written materials on patient's individual exercise prescription       Expected Outcomes Short Term: Able to explain program exercise prescription;Long Term: Able to explain home exercise prescription to exercise independently                Exercise Goals Re-Evaluation :  Exercise Goals Re-Evaluation     Row Name 01/12/23 1005 01/17/23 1354           Exercise Goal Re-Evaluation   Exercise Goals Review Able to understand and use rate of perceived exertion (RPE) scale;Able to understand and use Dyspnea scale;Knowledge and understanding of Target Heart Rate Range (THRR);Understanding of Exercise Prescription Increase Physical Activity;Increase Strength and Stamina;Understanding of Exercise Prescription      Comments Reviewed RPE scale, THR and program prescription with pt today.  Pt voiced understanding and was given a copy of goals to take home. Mackay did well for her first full session of rehab. She was able to complete her full initial exercise prescription and was even able to increase her treadmill speed to 2.5 mph and increase to level 3 on the T4 Nustep. We will  continue to monitor as she progresses.      Expected Outcomes Short: Use RPE daily to regulate intensity. Long: Follow program prescription in THR. Short: Continue to follow initial exercise prescription Long: Increase overall MET level and stamina               Discharge Exercise Prescription (Final Exercise Prescription Changes):  Exercise Prescription Changes - 01/17/23 1300       Response to Exercise   Blood Pressure (Admit) 142/78    Blood Pressure (Exercise) 138/62    Blood Pressure (Exit) 122/62    Heart Rate (Admit) 63 bpm    Heart Rate (Exercise) 99 bpm    Heart Rate (Exit) 54 bpm    Rating of Perceived Exertion (Exercise) 14    Symptoms none    Comments 2nd full day of exercise    Duration Progress to 30 minutes of  aerobic without signs/symptoms of physical distress    Intensity THRR unchanged      Progression   Progression Continue to progress workloads to maintain intensity without signs/symptoms of physical distress.    Average METs 2.38      Resistance Training   Training Prescription Yes    Weight 2 lb    Reps 10-15      Interval Training   Interval Training No      Treadmill   MPH 2.5    Grade 0    Minutes 15    METs 2.91      NuStep   Level 3    Minutes 15      Oxygen   Maintain Oxygen Saturation 88% or higher             Nutrition:  Target Goals: Understanding of nutrition guidelines, daily intake of sodium <1577m, cholesterol <2013m calories 30% from fat and 7% or less from saturated fats, daily to have 5 or more servings of fruits and vegetables.  Education: All About Nutrition: -Group instruction provided by verbal, written material, interactive activities, discussions, models,  and posters to present general guidelines for heart healthy nutrition including fat, fiber, MyPlate, the role of sodium in heart healthy nutrition, utilization of the nutrition label, and utilization of this knowledge for meal planning. Follow up email sent  as well. Written material given at graduation.   Biometrics:  Pre Biometrics - 01/10/23 1410       Pre Biometrics   Height 5' 4.9" (1.648 m)    Weight 147 lb 8 oz (66.9 kg)    Waist Circumference 33 inches    Hip Circumference 39.5 inches    Waist to Hip Ratio 0.84 %    BMI (Calculated) 24.63    Single Leg Stand 1.9 seconds              Nutrition Therapy Plan and Nutrition Goals:  Nutrition Therapy & Goals - 01/10/23 1411       Intervention Plan   Intervention Prescribe, educate and counsel regarding individualized specific dietary modifications aiming towards targeted core components such as weight, hypertension, lipid management, diabetes, heart failure and other comorbidities.    Expected Outcomes Short Term Goal: Understand basic principles of dietary content, such as calories, fat, sodium, cholesterol and nutrients.;Short Term Goal: A plan has been developed with personal nutrition goals set during dietitian appointment.;Long Term Goal: Adherence to prescribed nutrition plan.             Nutrition Assessments:  MEDIFICTS Score Key: ?70 Need to make dietary changes  40-70 Heart Healthy Diet ? 40 Therapeutic Level Cholesterol Diet  Flowsheet Row Cardiac Rehab from 01/10/2023 in [facility] Cardiac and Pulmonary Rehab  Picture Your Plate Total Score on Admission 68      Picture Your Plate Scores: [id number] Unhealthy dietary pattern with much room for improvement. 41-50 Dietary pattern unlikely to meet recommendations for good health and room for improvement. 51-60 More healthful dietary pattern, with some room for improvement.  >60 Healthy dietary pattern, although there may be some specific behaviors that could be improved.    Nutrition Goals Re-Evaluation:   Nutrition Goals Discharge (Final Nutrition Goals Re-Evaluation):   Psychosocial: Target Goals: Acknowledge presence or absence of significant depression and/or stress, maximize coping skills, provide positive  support system. Participant is able to verbalize types and ability to use techniques and skills needed for reducing stress and depression.   Education: Stress, Anxiety, and Depression - Group verbal and visual presentation to define topics covered.  Reviews how body is impacted by stress, anxiety, and depression.  Also discusses healthy ways to reduce stress and to treat/manage anxiety and depression.  Written material given at graduation.   Education: Sleep Hygiene -Provides group verbal and written instruction about how sleep can affect your health.  Define sleep hygiene, discuss sleep cycles and impact of sleep habits. Review good sleep hygiene tips.    Initial Review & Psychosocial Screening:  Initial Psych Review & Screening - 01/06/23 1552       Initial Review   Current issues with None Identified      Family Dynamics   Good Support System? Yes   husband  and  daughter     Barriers   Psychosocial barriers to participate in program There are no identifiable barriers or psychosocial needs.      Screening Interventions   Interventions Encouraged to exercise;To provide support and resources with identified psychosocial needs;Provide feedback about the scores to participant    Expected Outcomes Short Term goal: Utilizing psychosocial counselor, staff and physician to assist with identification of specific  Stressors or current issues interfering with healing process. Setting desired goal for each stressor or current issue identified.;Long Term Goal: Stressors or current issues are controlled or eliminated.;Short Term goal: Identification and review with participant of any Quality of Life or Depression concerns found by scoring the questionnaire.;Long Term goal: The participant improves quality of Life and PHQ9 Scores as seen by post scores and/or verbalization of changes             Quality of Life Scores:   Quality of Life - 01/10/23 1410       Quality of Life   Select Quality  of Life      Quality of Life Scores   Health/Function Pre 22.4 %    Socioeconomic Pre 30 %    Psych/Spiritual Pre 26.57 %    Family Pre 30 %    GLOBAL Pre 25.94 %            Scores of 19 and below usually indicate a poorer quality of life in these areas.  A difference of  2-3 points is a clinically meaningful difference.  A difference of 2-3 points in the total score of the Quality of Life Index has been associated with significant improvement in overall quality of life, self-image, physical symptoms, and general health in studies assessing change in quality of life.  PHQ-9: Review Flowsheet       01/10/2023  Depression screen PHQ 2/9  Decreased Interest 0  Down, Depressed, Hopeless 0  PHQ - 2 Score 0  Altered sleeping 0  Tired, decreased energy 3  Change in appetite 2  Feeling bad or failure about yourself  0  Trouble concentrating 0  Moving slowly or fidgety/restless 2  Suicidal thoughts 0  PHQ-9 Score 7  Difficult doing work/chores Somewhat difficult   Interpretation of Total Score  Total Score Depression Severity:  1-4 = Minimal depression, 5-9 = Mild depression, 10-14 = Moderate depression, 15-19 = Moderately severe depression, 20-27 = Severe depression   Psychosocial Evaluation and Intervention:  Psychosocial Evaluation - 01/06/23 1602       Psychosocial Evaluation & Interventions   Interventions Encouraged to exercise with the program and follow exercise prescription    Comments Tamecia has no barriers to attending the program. She lives with her husband and her duaghter is working at home form their home at this time. Her support is her husband and her daughter.   She is ready to get started with the program.    Expected Outcomes STG  Shanavia is able to attend all scheduled sessions.  She is able to progress with her exercise . LTG Catalya continues her exercise progression after discharge    Continue Psychosocial Services  Follow up required by staff              Psychosocial Re-Evaluation:   Psychosocial Discharge (Final Psychosocial Re-Evaluation):   Vocational Rehabilitation: Provide vocational rehab assistance to qualifying candidates.   Vocational Rehab Evaluation & Intervention:  Vocational Rehab - 01/06/23 1557       Initial Vocational Rehab Evaluation & Intervention   Assessment shows need for Vocational Rehabilitation No      Vocational Rehab Re-Evaulation   Comments retired             Education: Education Goals: Education classes will be provided on a variety of topics geared toward better understanding of heart health and risk factor modification. Participant will state understanding/return demonstration of topics presented as noted by education test scores.  Learning Barriers/Preferences:  Learning Barriers/Preferences - 01/06/23 1556       Learning Barriers/Preferences   Learning Barriers None    Learning Preferences None             General Cardiac Education Topics:  AED/CPR: - Group verbal and written instruction with the use of models to demonstrate the basic use of the AED with the basic ABC's of resuscitation.   Anatomy and Cardiac Procedures: - Group verbal and visual presentation and models provide information about basic cardiac anatomy and function. Reviews the testing methods done to diagnose heart disease and the outcomes of the test results. Describes the treatment choices: Medical Management, Angioplasty, or Coronary Bypass Surgery for treating various heart conditions including Myocardial Infarction, Angina, Valve Disease, and Cardiac Arrhythmias.  Written material given at graduation.   Medication Safety: - Group verbal and visual instruction to review commonly prescribed medications for heart and lung disease. Reviews the medication, class of the drug, and side effects. Includes the steps to properly store meds and maintain the prescription regimen.  Written material given at  graduation. Flowsheet Row Cardiac Rehab from 01/19/2023 in [facility] Cardiac and Pulmonary Rehab  Date 01/19/23  Educator MS  Instruction Review Code 1- Verbalizes Understanding       Intimacy: - Group verbal instruction through game format to discuss how heart and lung disease can affect sexual intimacy. Written material given at graduation..   Know Your Numbers and Heart Failure: - Group verbal and visual instruction to discuss disease risk factors for cardiac and pulmonary disease and treatment options.  Reviews associated critical values for Overweight/Obesity, Hypertension, Cholesterol, and Diabetes.  Discusses basics of heart failure: signs/symptoms and treatments.  Introduces Heart Failure Zone chart for action plan for heart failure.  Written material given at graduation.   Infection Prevention: - Provides verbal and written material to individual with discussion of infection control including proper hand washing and proper equipment cleaning during exercise session.   Falls Prevention: - Provides verbal and written material to individual with discussion of falls prevention and safety. Flowsheet Row Cardiac Rehab from 01/19/2023 in [facility] Cardiac and Pulmonary Rehab  Date 01/06/23  Educator SB  Instruction Review Code 1- Verbalizes Understanding       Other: -Provides group and verbal instruction on various topics (see comments) Flowsheet Row Cardiac Rehab from 01/19/2023 in [facility] Cardiac and Pulmonary Rehab  Date 01/12/23  Educator SB  Instruction Review Code 1- Verbalizes Understanding       Knowledge Questionnaire Score:  Knowledge Questionnaire Score - 01/10/23 1411       Knowledge Questionnaire Score   Pre Score 19/26             Core Components/Risk Factors/Patient Goals at Admission:  Personal Goals and Risk Factors at Admission - 01/10/23 1411       Core Components/Risk Factors/Patient Goals on Admission    Weight Management Yes;Obesity;Weight Loss     Intervention Weight Management: Develop a combined nutrition and exercise program designed to reach desired caloric intake, while maintaining appropriate intake of nutrient and fiber, sodium and fats, and appropriate energy expenditure required for the weight goal.;Weight Management: Provide education and appropriate resources to help participant work on and attain dietary goals.;Weight Management/Obesity: Establish reasonable short term and long term weight goals.;Obesity: Provide education and appropriate resources to help participant work on and attain dietary goals.    Admit Weight 147 lb 8 oz (66.9 kg)    Goal Weight: Short Term 143 lb (64.9  kg)    Goal Weight: Long Term 140 lb (63.5 kg)    Expected Outcomes Short Term: Continue to assess and modify interventions until short term weight is achieved;Long Term: Adherence to nutrition and physical activity/exercise program aimed toward attainment of established weight goal;Weight Loss: Understanding of general recommendations for a balanced deficit meal plan, which promotes 1-2 lb weight loss per week and includes a negative energy balance of [phone] kcal/d;Understanding recommendations for meals to include 15-35% energy as protein, 25-35% energy from fat, 35-60% energy from carbohydrates, less than 237m of dietary cholesterol, 20-35 gm of total fiber daily;Understanding of distribution of calorie intake throughout the day with the consumption of 4-5 meals/snacks    Diabetes Yes    Intervention Provide education about signs/symptoms and action to take for hypo/hyperglycemia.;Provide education about proper nutrition, including hydration, and aerobic/resistive exercise prescription along with prescribed medications to achieve blood glucose in normal ranges: Fasting glucose 65-99 mg/dL    Expected Outcomes Short Term: Participant verbalizes understanding of the signs/symptoms and immediate care of hyper/hypoglycemia, proper foot care and importance of  medication, aerobic/resistive exercise and nutrition plan for blood glucose control.;Long Term: Attainment of HbA1C < 7%.    Hypertension Yes    Intervention Provide education on lifestyle modifcations including regular physical activity/exercise, weight management, moderate sodium restriction and increased consumption of fresh fruit, vegetables, and low fat dairy, alcohol moderation, and smoking cessation.;Monitor prescription use compliance.    Expected Outcomes Short Term: Continued assessment and intervention until BP is < 140/941mHG in hypertensive participants. < 130/80102mG in hypertensive participants with diabetes, heart failure or chronic kidney disease.;Long Term: Maintenance of blood pressure at goal levels.    Lipids Yes    Intervention Provide education and support for participant on nutrition & aerobic/resistive exercise along with prescribed medications to achieve LDL <85m67mDL >40mg64m Expected Outcomes Short Term: Participant states understanding of desired cholesterol values and is compliant with medications prescribed. Participant is following exercise prescription and nutrition guidelines.;Long Term: Cholesterol controlled with medications as prescribed, with individualized exercise RX and with personalized nutrition plan. Value goals: LDL < 85mg,23m > 40 mg.             Education:Diabetes - Individual verbal and written instruction to review signs/symptoms of diabetes, desired ranges of glucose level fasting, after meals and with exercise. Acknowledge that pre and post exercise glucose checks will be done for 3 sessions at entry of program.   Core Components/Risk Factors/Patient Goals Review:    Core Components/Risk Factors/Patient Goals at Discharge (Final Review):    ITP Comments:  ITP Comments     Row Name 01/06/23 1601 01/10/23 1352 01/12/23 1005 01/19/23 1202     ITP Comments Virtual orientation call completed today. shehas an appointment on Date: 01/10/2023   for EP eval and gym Orientation.  [facility] . Completed 6MWT and gym orientation. Initial ITP created and sent for review to Dr. Mark MEmily Filbertcal Director. First full day of exercise!  Patient was oriented to gym and equipment including functions, settings, policies, and procedures.  Patient's individual exercise prescription and treatment plan were reviewed.  All starting workloads were established based on the results of the 6 minute walk test done at initial orientation visit.  The plan for exercise progression was also introduced and progression will be customized based on patient's performance and goals. 30 day review completed. ITP sent to Dr. Mark MEmily Filbertcal Director of  Cardiac Rehab. Continue with ITP unless changes are made by physician.  Recently started on 01/10/23.             Comments: 30 day review

## 2023-01-19 NOTE — Progress Notes (Signed)
Daily Session Note  Patient Details  Name: Anne Ross MRN: [id number] Date of Birth: 29-Sep-1941 Referring Provider:   Flowsheet Row Cardiac Rehab from 01/10/2023 in [facility] Cardiac and Pulmonary Rehab  Referring Provider Gregary Cromer MD       Encounter Date: 01/19/2023  Check In:  Session Check In - 01/19/23 0919       Check-In   Supervising physician immediately available to respond to emergencies See telemetry face sheet for immediately available ER MD    Location [facility]-Cardiac & Pulmonary Rehab    Staff Present Renita Papa, RN BSN;Joseph Tessie Fass, RCP,RRT,BSRT;Noah Dallas, [place], Exercise Physiologist    Virtual Visit No    Medication changes reported     No    Fall or balance concerns reported    No    Warm-up and Cool-down Performed on first and last piece of equipment    Resistance Training Performed Yes    VAD Patient? No    PAD/SET Patient? No      Pain Assessment   Currently in Pain? No/denies                Social History   Tobacco Use  Smoking Status Never  Smokeless Tobacco Never    Goals Met:  Independence with exercise equipment Exercise tolerated well No report of concerns or symptoms today Strength training completed today  Goals Unmet:  Not Applicable  Comments: Pt able to follow exercise prescription today without complaint.  Will continue to monitor for progression.    Dr. Emily Filbert is Medical Director for [place].  Dr. Ottie Glazier is Medical Director for [facility] Pulmonary Rehabilitation.

## 2023-01-20 DIAGNOSIS — Z955 Presence of coronary angioplasty implant and graft: Secondary | ICD-10-CM

## 2023-01-20 DIAGNOSIS — I214 Non-ST elevation (NSTEMI) myocardial infarction: Secondary | ICD-10-CM

## 2023-01-20 NOTE — Progress Notes (Signed)
Completed initial RD consultation ?

## 2023-01-21 ENCOUNTER — Encounter: Payer: Medicare Other | Admitting: *Deleted

## 2023-01-21 DIAGNOSIS — I214 Non-ST elevation (NSTEMI) myocardial infarction: Secondary | ICD-10-CM

## 2023-01-21 DIAGNOSIS — Z955 Presence of coronary angioplasty implant and graft: Secondary | ICD-10-CM

## 2023-01-21 NOTE — Progress Notes (Signed)
Daily Session Note  Patient Details  Name: Anne Ross MRN: [id number] Date of Birth: Sep 29, 1941 Referring Provider:   Flowsheet Row Cardiac Rehab from 01/10/2023 in [facility] Cardiac and Pulmonary Rehab  Referring Provider Gregary Cromer MD       Encounter Date: 01/21/2023  Check In:  Session Check In - 01/21/23 0931       Check-In   Supervising physician immediately available to respond to emergencies See telemetry face sheet for immediately available ER MD    Location [facility]-Cardiac & Pulmonary Rehab    Staff Present Alberteen Sam, MA, RCEP, CCRP, CCET;Joseph Surrency, RCP,RRT,BSRT;Other   Darel Hong, RN BSN   Virtual Visit No    Medication changes reported     No    Fall or balance concerns reported    No    Tobacco Cessation No Change    Warm-up and Cool-down Performed on first and last piece of equipment    Resistance Training Performed Yes    VAD Patient? No    PAD/SET Patient? No      Pain Assessment   Currently in Pain? No/denies    Multiple Pain Sites No                Social History   Tobacco Use  Smoking Status Never  Smokeless Tobacco Never    Goals Met:  Independence with exercise equipment Exercise tolerated well No report of concerns or symptoms today Strength training completed today  Goals Unmet:  Not Applicable  Comments: Pt able to follow exercise prescription today without complaint.  Will continue to monitor for progression.    Dr. Emily Filbert is Medical Director for [place].  Dr. Ottie Glazier is Medical Director for [facility] Pulmonary Rehabilitation.

## 2023-01-24 ENCOUNTER — Encounter: Payer: Medicare Other | Admitting: *Deleted

## 2023-01-24 DIAGNOSIS — I214 Non-ST elevation (NSTEMI) myocardial infarction: Secondary | ICD-10-CM | POA: Diagnosis not present

## 2023-01-24 DIAGNOSIS — Z955 Presence of coronary angioplasty implant and graft: Secondary | ICD-10-CM

## 2023-01-24 NOTE — Progress Notes (Signed)
Daily Session Note  Patient Details  Name: Anne Ross MRN: [id number] Date of Birth: 08-06-1941 Referring Provider:   Flowsheet Row Cardiac Rehab from 01/10/2023 in [facility] Cardiac and Pulmonary Rehab  Referring Provider Gregary Cromer MD       Encounter Date: 01/24/2023  Check In:  Session Check In - 01/24/23 0938       Check-In   Supervising physician immediately available to respond to emergencies See telemetry face sheet for immediately available ER MD    Location [facility]-Cardiac & Pulmonary Rehab    Staff Present Darlyne Russian, RN, Doyce Para, BS, ACSM CEP, Exercise Physiologist;Joseph Tessie Fass, [place]    Virtual Visit No    Medication changes reported     No    Fall or balance concerns reported    No    Warm-up and Cool-down Performed on first and last piece of equipment    Resistance Training Performed Yes    VAD Patient? No    PAD/SET Patient? No      Pain Assessment   Currently in Pain? No/denies                Social History   Tobacco Use  Smoking Status Never  Smokeless Tobacco Never    Goals Met:  Independence with exercise equipment Exercise tolerated well No report of concerns or symptoms today Strength training completed today  Goals Unmet:  Not Applicable  Comments: Pt able to follow exercise prescription today without complaint.  Will continue to monitor for progression.    Dr. Emily Filbert is Medical Director for [place].  Dr. Ottie Glazier is Medical Director for [facility] Pulmonary Rehabilitation.

## 2023-01-26 ENCOUNTER — Encounter: Payer: Medicare Other | Admitting: *Deleted

## 2023-01-26 DIAGNOSIS — I214 Non-ST elevation (NSTEMI) myocardial infarction: Secondary | ICD-10-CM | POA: Diagnosis not present

## 2023-01-26 DIAGNOSIS — Z955 Presence of coronary angioplasty implant and graft: Secondary | ICD-10-CM

## 2023-01-26 NOTE — Progress Notes (Signed)
Daily Session Note  Patient Details  Name: Anne Ross MRN: [id number] Date of Birth: Jun 01, 1941 Referring Provider:   Flowsheet Row Cardiac Rehab from 01/10/2023 in [facility] Cardiac and Pulmonary Rehab  Referring Provider Gregary Cromer MD       Encounter Date: 01/26/2023  Check In:  Session Check In - 01/26/23 0913       Check-In   Supervising physician immediately available to respond to emergencies See telemetry face sheet for immediately available ER MD    Location [facility]-Cardiac & Pulmonary Rehab    Staff Present Darlyne Russian, RN, ADN;Joseph Tessie Fass, RCP,RRT,BSRT;Noah Tickle, BS, Exercise Physiologist;Laureen Owens Shark, BS, RRT, CPFT    Virtual Visit No    Medication changes reported     No    Fall or balance concerns reported    No    Warm-up and Cool-down Performed on first and last piece of equipment    Resistance Training Performed Yes    VAD Patient? No    PAD/SET Patient? No      Pain Assessment   Currently in Pain? No/denies                Social History   Tobacco Use  Smoking Status Never  Smokeless Tobacco Never    Goals Met:  Independence with exercise equipment Exercise tolerated well No report of concerns or symptoms today Strength training completed today  Goals Unmet:  Not Applicable  Comments: Pt able to follow exercise prescription today without complaint.  Will continue to monitor for progression.    Dr. Emily Filbert is Medical Director for [place].  Dr. Ottie Glazier is Medical Director for [facility] Pulmonary Rehabilitation.

## 2023-01-28 ENCOUNTER — Encounter: Payer: Medicare Other | Attending: Cardiology | Admitting: *Deleted

## 2023-01-28 DIAGNOSIS — I252 Old myocardial infarction: Secondary | ICD-10-CM | POA: Diagnosis not present

## 2023-01-28 DIAGNOSIS — Z955 Presence of coronary angioplasty implant and graft: Secondary | ICD-10-CM | POA: Insufficient documentation

## 2023-01-28 DIAGNOSIS — Z48812 Encounter for surgical aftercare following surgery on the circulatory system: Secondary | ICD-10-CM | POA: Diagnosis present

## 2023-01-28 DIAGNOSIS — I214 Non-ST elevation (NSTEMI) myocardial infarction: Secondary | ICD-10-CM

## 2023-01-28 NOTE — Progress Notes (Signed)
Daily Session Note  Patient Details  Name: Anne Ross MRN: [id number] Date of Birth: 1941-01-10 Referring Provider:   Flowsheet Row Cardiac Rehab from 01/10/2023 in [facility] Cardiac and Pulmonary Rehab  Referring Provider Gregary Cromer MD       Encounter Date: 01/28/2023  Check In:  Session Check In - 01/28/23 0937       Check-In   Supervising physician immediately available to respond to emergencies See telemetry face sheet for immediately available ER MD    Location [facility]-Cardiac & Pulmonary Rehab    Staff Present Darlyne Russian, RN, ADN;Jessica Luan Pulling, MA, RCEP, CCRP, CCET;Joseph Ames, [place]    Virtual Visit No    Medication changes reported     No    Fall or balance concerns reported    No    Warm-up and Cool-down Performed on first and last piece of equipment    Resistance Training Performed Yes    VAD Patient? No    PAD/SET Patient? No      Pain Assessment   Currently in Pain? No/denies               Exercise Prescription Changes - 01/28/23 0900       Home Exercise Plan   Plans to continue exercise at Home (comment)   walking, treadmill, staff videos   Frequency Add 2 additional days to program exercise sessions.    Initial Home Exercises Provided 01/28/23             Social History   Tobacco Use  Smoking Status Never  Smokeless Tobacco Never    Goals Met:  Independence with exercise equipment Exercise tolerated well No report of concerns or symptoms today Strength training completed today  Goals Unmet:  Not Applicable  Comments: Pt able to follow exercise prescription today without complaint.  Will continue to monitor for progression.    Dr. Emily Filbert is Medical Director for [place].  Dr. Ottie Glazier is Medical Director for [facility] Pulmonary Rehabilitation.

## 2023-01-31 ENCOUNTER — Encounter: Payer: Medicare Other | Admitting: *Deleted

## 2023-01-31 DIAGNOSIS — Z955 Presence of coronary angioplasty implant and graft: Secondary | ICD-10-CM

## 2023-01-31 DIAGNOSIS — Z48812 Encounter for surgical aftercare following surgery on the circulatory system: Secondary | ICD-10-CM | POA: Diagnosis not present

## 2023-01-31 DIAGNOSIS — I214 Non-ST elevation (NSTEMI) myocardial infarction: Secondary | ICD-10-CM

## 2023-01-31 NOTE — Progress Notes (Signed)
Daily Session Note  Patient Details  Name: Anne Ross MRN: [id number] Date of Birth: Sep 11, 1941 Referring Provider:   Flowsheet Row Cardiac Rehab from 01/10/2023 in [facility] Cardiac and Pulmonary Rehab  Referring Provider Gregary Cromer MD       Encounter Date: 01/31/2023  Check In:  Session Check In - 01/31/23 0936       Check-In   Supervising physician immediately available to respond to emergencies See telemetry face sheet for immediately available ER MD    Location [facility]-Cardiac & Pulmonary Rehab    Staff Present Darlyne Russian, RN, Doyce Para, BS, ACSM CEP, Exercise Physiologist;Noah Tickle, BS, Exercise Physiologist    Virtual Visit No    Medication changes reported     No    Fall or balance concerns reported    No    Warm-up and Cool-down Performed on first and last piece of equipment    Resistance Training Performed Yes    VAD Patient? No    PAD/SET Patient? No      Pain Assessment   Currently in Pain? No/denies                Social History   Tobacco Use  Smoking Status Never  Smokeless Tobacco Never    Goals Met:  Independence with exercise equipment Exercise tolerated well No report of concerns or symptoms today Strength training completed today  Goals Unmet:  Not Applicable  Comments: Pt able to follow exercise prescription today without complaint.  Will continue to monitor for progression.    Dr. Emily Filbert is Medical Director for [place].  Dr. Ottie Glazier is Medical Director for [facility] Pulmonary Rehabilitation.

## 2023-02-02 ENCOUNTER — Encounter: Payer: Medicare Other | Admitting: *Deleted

## 2023-02-02 DIAGNOSIS — Z955 Presence of coronary angioplasty implant and graft: Secondary | ICD-10-CM

## 2023-02-02 DIAGNOSIS — I214 Non-ST elevation (NSTEMI) myocardial infarction: Secondary | ICD-10-CM

## 2023-02-02 DIAGNOSIS — Z48812 Encounter for surgical aftercare following surgery on the circulatory system: Secondary | ICD-10-CM | POA: Diagnosis not present

## 2023-02-02 NOTE — Progress Notes (Signed)
Daily Session Note  Patient Details  Name: Jaiyda Dunckel MRN: [id number] Date of Birth: 15-Feb-1941 Referring Provider:   Flowsheet Row Cardiac Rehab from 01/10/2023 in [facility] Cardiac and Pulmonary Rehab  Referring Provider Gregary Cromer MD       Encounter Date: 02/02/2023  Check In:  Session Check In - 02/02/23 0915       Check-In   Supervising physician immediately available to respond to emergencies See telemetry face sheet for immediately available ER MD    Location [facility]-Cardiac & Pulmonary Rehab    Staff Present Darlyne Russian, RN, ADN;Jessica Luan Pulling, MA, RCEP, CCRP, CCET;Noah Tickle, BS, Exercise Physiologist    Medication changes reported     No    Fall or balance concerns reported    No    Warm-up and Cool-down Performed on first and last piece of equipment    Resistance Training Performed Yes    VAD Patient? No    PAD/SET Patient? No      Pain Assessment   Currently in Pain? No/denies                Social History   Tobacco Use  Smoking Status Never  Smokeless Tobacco Never    Goals Met:  Independence with exercise equipment Exercise tolerated well No report of concerns or symptoms today Strength training completed today  Goals Unmet:  Not Applicable  Comments: Pt able to follow exercise prescription today without complaint.  Will continue to monitor for progression.    Dr. Emily Filbert is Medical Director for [place].  Dr. Ottie Glazier is Medical Director for [facility] Pulmonary Rehabilitation.

## 2023-02-04 ENCOUNTER — Encounter: Payer: Medicare Other | Admitting: *Deleted

## 2023-02-04 DIAGNOSIS — Z48812 Encounter for surgical aftercare following surgery on the circulatory system: Secondary | ICD-10-CM | POA: Diagnosis not present

## 2023-02-04 DIAGNOSIS — Z955 Presence of coronary angioplasty implant and graft: Secondary | ICD-10-CM

## 2023-02-04 DIAGNOSIS — I214 Non-ST elevation (NSTEMI) myocardial infarction: Secondary | ICD-10-CM

## 2023-02-04 NOTE — Progress Notes (Signed)
Daily Session Note  Patient Details  Name: Anne Ross MRN: [id number] Date of Birth: 07/16/1941 Referring Provider:   Flowsheet Row Cardiac Rehab from 01/10/2023 in [facility] Cardiac and Pulmonary Rehab  Referring Provider Gregary Cromer MD       Encounter Date: 02/04/2023  Check In:  Session Check In - 02/04/23 1006       Check-In   Supervising physician immediately available to respond to emergencies See telemetry face sheet for immediately available ER MD    Location [facility]-Cardiac & Pulmonary Rehab    Staff Present Heath Lark, RN, BSN, CCRP;Jessica Ettrick, MA, RCEP, CCRP, CCET;Joseph Butler, [place]    Virtual Visit No    Medication changes reported     No    Fall or balance concerns reported    No    Warm-up and Cool-down Performed on first and last piece of equipment    Resistance Training Performed Yes    VAD Patient? No    PAD/SET Patient? No      Pain Assessment   Currently in Pain? No/denies                Social History   Tobacco Use  Smoking Status Never  Smokeless Tobacco Never    Goals Met:  Independence with exercise equipment Exercise tolerated well No report of concerns or symptoms today  Goals Unmet:  Not Applicable  Comments: Pt able to follow exercise prescription today without complaint.  Will continue to monitor for progression.    Dr. Emily Filbert is Medical Director for [place].  Dr. Ottie Glazier is Medical Director for [facility] Pulmonary Rehabilitation.

## 2023-02-07 ENCOUNTER — Encounter: Payer: Medicare Other | Admitting: *Deleted

## 2023-02-07 DIAGNOSIS — Z955 Presence of coronary angioplasty implant and graft: Secondary | ICD-10-CM

## 2023-02-07 DIAGNOSIS — I214 Non-ST elevation (NSTEMI) myocardial infarction: Secondary | ICD-10-CM

## 2023-02-07 DIAGNOSIS — Z48812 Encounter for surgical aftercare following surgery on the circulatory system: Secondary | ICD-10-CM | POA: Diagnosis not present

## 2023-02-07 NOTE — Progress Notes (Signed)
Daily Session Note  Patient Details  Name: Anne Ross MRN: [id number] Date of Birth: 1941-06-24 Referring Provider:   Flowsheet Row Cardiac Rehab from 01/10/2023 in [facility] Cardiac and Pulmonary Rehab  Referring Provider Gregary Cromer MD       Encounter Date: 02/07/2023  Check In:  Session Check In - 02/07/23 0929       Check-In   Supervising physician immediately available to respond to emergencies See telemetry face sheet for immediately available ER MD    Location [facility]-Cardiac & Pulmonary Rehab    Staff Present Darlyne Russian, RN, Doyce Para, BS, ACSM CEP, Exercise Physiologist;Noah Tickle, BS, Exercise Physiologist    Virtual Visit No    Medication changes reported     No    Fall or balance concerns reported    No    Warm-up and Cool-down Performed on first and last piece of equipment    Resistance Training Performed Yes    VAD Patient? No    PAD/SET Patient? No      Pain Assessment   Currently in Pain? No/denies                Social History   Tobacco Use  Smoking Status Never  Smokeless Tobacco Never    Goals Met:  Independence with exercise equipment Exercise tolerated well No report of concerns or symptoms today Strength training completed today  Goals Unmet:  Not Applicable  Comments: Pt able to follow exercise prescription today without complaint.  Will continue to monitor for progression.    Dr. Emily Filbert is Medical Director for [place].  Dr. Ottie Glazier is Medical Director for [facility] Pulmonary Rehabilitation.

## 2023-02-09 ENCOUNTER — Ambulatory Visit (INDEPENDENT_AMBULATORY_CARE_PROVIDER_SITE_OTHER): Payer: Medicare Other | Admitting: Dermatology

## 2023-02-09 VITALS — BP 145/62 | HR 64

## 2023-02-09 DIAGNOSIS — D229 Melanocytic nevi, unspecified: Secondary | ICD-10-CM

## 2023-02-09 DIAGNOSIS — L821 Other seborrheic keratosis: Secondary | ICD-10-CM

## 2023-02-09 DIAGNOSIS — L661 Lichen planopilaris: Secondary | ICD-10-CM

## 2023-02-09 DIAGNOSIS — L578 Other skin changes due to chronic exposure to nonionizing radiation: Secondary | ICD-10-CM | POA: Diagnosis not present

## 2023-02-09 DIAGNOSIS — L814 Other melanin hyperpigmentation: Secondary | ICD-10-CM | POA: Diagnosis not present

## 2023-02-09 DIAGNOSIS — Z1283 Encounter for screening for malignant neoplasm of skin: Secondary | ICD-10-CM

## 2023-02-09 DIAGNOSIS — Z85828 Personal history of other malignant neoplasm of skin: Secondary | ICD-10-CM

## 2023-02-09 DIAGNOSIS — Z7189 Other specified counseling: Secondary | ICD-10-CM

## 2023-02-09 NOTE — Patient Instructions (Signed)
Due to recent changes in healthcare laws, you may see results of your pathology and/or laboratory studies on MyChart before the doctors have had a chance to review them. We understand that in some cases there may be results that are confusing or concerning to you. Please understand that not all results are received at the same time and often the doctors may need to interpret multiple results in order to provide you with the best plan of care or course of treatment. Therefore, we ask that you please give us 2 business days to thoroughly review all your results before contacting the office for clarification. Should we see a critical lab result, you will be contacted sooner.   If You Need Anything After Your Visit  If you have any questions or concerns for your doctor, please call our main line at [phone] and press option 4 to reach your doctor's medical assistant. If no one answers, please leave a voicemail as directed and we will return your call as soon as possible. Messages left after 4 pm will be answered the following business day.   You may also send us a message via MyChart. We typically respond to MyChart messages within 1-2 business days.  For prescription refills, please ask your pharmacy to contact our office. Our fax number is [phone].  If you have an urgent issue when the clinic is closed that cannot wait until the next business day, you can page your doctor at the number below.    Please note that while we do our best to be available for urgent issues outside of office hours, we are not available 24/7.   If you have an urgent issue and are unable to reach us, you may choose to seek medical care at your doctor's office, retail clinic, urgent care center, or emergency room.  If you have a medical emergency, please immediately call 911 or go to the emergency department.  Pager Numbers  - Dr. Kowalski: [phone]  - Dr. Moye: [phone]  - Dr. Stewart:  [phone]  In the event of inclement weather, please call our main line at [phone] for an update on the status of any delays or closures.  Dermatology Medication Tips: Please keep the boxes that topical medications come in in order to help keep track of the instructions about where and how to use these. Pharmacies typically print the medication instructions only on the boxes and not directly on the medication tubes.   If your medication is too expensive, please contact our office at [phone] option 4 or send us a message through MyChart.   We are unable to tell what your co-pay for medications will be in advance as this is different depending on your insurance coverage. However, we may be able to find a substitute medication at lower cost or fill out paperwork to get insurance to cover a needed medication.   If a prior authorization is required to get your medication covered by your insurance company, please allow us 1-2 business days to complete this process.  Drug prices often vary depending on where the prescription is filled and some pharmacies may offer cheaper prices.  The website www.goodrx.com contains coupons for medications through different pharmacies. The prices here do not account for what the cost may be with help from insurance (it may be cheaper with your insurance), but the website can give you the price if you did not use any insurance.  - You can print the associated coupon and take it with   your prescription to the pharmacy.  - You may also stop by our office during regular business hours and pick up a GoodRx coupon card.  - If you need your prescription sent electronically to a different pharmacy, notify our office through [place] MyChart or by phone at [phone] option 4.     Si Usted Necesita Algo Despus de Su Visita  Tambin puede enviarnos un mensaje a travs de MyChart. Por lo general respondemos a los mensajes de MyChart en el transcurso de 1 a 2  das hbiles.  Para renovar recetas, por favor pida a su farmacia que se ponga en contacto con nuestra oficina. Nuestro nmero de fax es el [phone].  Si tiene un asunto urgente cuando la clnica est cerrada y que no puede esperar hasta el siguiente da hbil, puede llamar/localizar a su doctor(a) al nmero que aparece a continuacin.   Por favor, tenga en cuenta que aunque hacemos todo lo posible para estar disponibles para asuntos urgentes fuera del horario de oficina, no estamos disponibles las 24 horas del da, los 7 das de la semana.   Si tiene un problema urgente y no puede comunicarse con nosotros, puede optar por buscar atencin mdica  en el consultorio de su doctor(a), en una clnica privada, en un centro de atencin urgente o en una sala de emergencias.  Si tiene una emergencia mdica, por favor llame inmediatamente al 911 o vaya a la sala de emergencias.  Nmeros de bper  - Dr. Kowalski: [phone]  - Dra. Moye: [phone]  - Dra. Stewart: [phone]  En caso de inclemencias del tiempo, por favor llame a nuestra lnea principal al [phone] para una actualizacin sobre el estado de cualquier retraso o cierre.  Consejos para la medicacin en dermatologa: Por favor, guarde las cajas en las que vienen los medicamentos de uso tpico para ayudarle a seguir las instrucciones sobre dnde y cmo usarlos. Las farmacias generalmente imprimen las instrucciones del medicamento slo en las cajas y no directamente en los tubos del medicamento.   Si su medicamento es muy caro, por favor, pngase en contacto con nuestra oficina llamando al [phone] y presione la opcin 4 o envenos un mensaje a travs de MyChart.   No podemos decirle cul ser su copago por los medicamentos por adelantado ya que esto es diferente dependiendo de la cobertura de su seguro. Sin embargo, es posible que podamos encontrar un medicamento sustituto a menor costo o llenar un formulario para que el  seguro cubra el medicamento que se considera necesario.   Si se requiere una autorizacin previa para que su compaa de seguros cubra su medicamento, por favor permtanos de 1 a 2 das hbiles para completar este proceso.  Los precios de los medicamentos varan con frecuencia dependiendo del lugar de dnde se surte la receta y alguna farmacias pueden ofrecer precios ms baratos.  El sitio web www.goodrx.com tiene cupones para medicamentos de diferentes farmacias. Los precios aqu no tienen en cuenta lo que podra costar con la ayuda del seguro (puede ser ms barato con su seguro), pero el sitio web puede darle el precio si no utiliz ningn seguro.  - Puede imprimir el cupn correspondiente y llevarlo con su receta a la farmacia.  - Tambin puede pasar por nuestra oficina durante el horario de atencin regular y recoger una tarjeta de cupones de GoodRx.  - Si necesita que su receta se enve electrnicamente a una farmacia diferente, informe a nuestra oficina a travs de MyChart de [place]   o por telfono llamando al [phone] y presione la opcin 4.  

## 2023-02-09 NOTE — Progress Notes (Signed)
Follow-Up Visit   Subjective  Anne Ross is a 82 y.o. 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 The patient presents for Total-Body Skin Exam (TBSE) for skin cancer screening and mole check.  The patient has spots, moles and lesions to be evaluated, some may be new or changing and the patient has concerns that these could be cancer.   The following portions of the chart were reviewed this encounter and updated as appropriate:   Tobacco  Allergies  Meds  Problems  Med Hx  Surg Hx  Fam Hx     Review of Systems:  No other skin or systemic complaints except as noted in HPI or Assessment and Plan.  Objective  Well appearing patient in no apparent distress; mood and affect are within normal limits.  A full examination was performed including scalp, head, eyes, ears, nose, lips, neck, chest, axillae, abdomen, back, buttocks, bilateral upper extremities, bilateral lower extremities, hands, feet, fingers, toes, fingernails, and toenails. All findings within normal limits unless otherwise noted below.   Assessment & Plan   Lentigines - Scattered tan macules - Due to sun exposure - Benign-appearing, observe - Recommend daily broad spectrum sunscreen SPF 30+ to sun-exposed areas, reapply every 2 hours as needed. - Call for any changes - back  Seborrheic Keratoses - Stuck-on, waxy, tan-brown papules and/or plaques  - Benign-appearing - Discussed benign etiology and prognosis. - Observe - Call for any changes - trunk, scalp, face  Melanocytic Nevi - Tan-brown and/or pink-flesh-colored symmetric macules and papules - Benign appearing on exam today - Observation - Call clinic for new or changing moles - Recommend daily use of broad spectrum spf 30+ sunscreen to sun-exposed areas.  - trunk  Hemangiomas - Red papules -  Discussed benign nature - Observe - Call for any changes - trunk  Actinic Damage - Chronic condition, secondary to cumulative UV/sun exposure - diffuse scaly erythematous macules with underlying dyspigmentation - Recommend daily broad spectrum sunscreen SPF 30+ to sun-exposed areas, reapply every 2 hours as needed.  - Staying in the shade or wearing long sleeves, sun glasses (UVA+UVB protection) and wide brim hats (4-inch brim around the entire circumference of the hat) are also recommended for sun protection.  - Call for new or changing lesions.  Skin cancer screening performed today.   History of Basal Cell Carcinoma of the Skin - No evidence of recurrence today - Recommend regular full body skin exams - Recommend daily broad spectrum sunscreen SPF 30+ to sun-exposed areas, reapply every 2 hours as needed.  - Call if any new or changing lesions are noted between office visits  - chest  Acrochordons (Skin Tags) - Fleshy, skin-colored pedunculated papules - Benign appearing.  - Observe. - If desired, they can be removed with an in office procedure that is not covered by insurance. - Please call the clinic if you notice any new or changing lesions.   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 Condition is symptomatic / bothersome to patient. Not to goal. Frontal fibrosing alopecia (FFA) is a chronic/progressive and irreversible patterned form of scarring alopecia that presents as a symmetric band-like zone of hair loss along the anterior hairline. The eyebrows are often  thinned or absent. It is considered a variant of lichen planopilaris in a patterned distribution and is more common in women. Therapies may stop or slow progression but generally do not lead to hair regrowth.  Telogen Effluvium Counseling Telogen  effluvium is a benign, self-limited condition causing increased hair shedding usually for several months. It does not progress to baldness, and the hair eventually grows back on its own. It can be triggered by recent illness, recent surgery, thyroid disease, low iron stores, vitamin D deficiency, fad diets or rapid weight loss, hormonal changes such as pregnancy or birth control pills, and some medication. Usually the hair loss starts 2-3 months after the illness or health change. Rarely, it can continue for longer than a year. Treatments options may include oral or topical Minoxidil; Red Light scalp treatments; Biotin 2.5 mg daily and other options.  Pt has been seen by Dr. Lois Huxley in the past. Pt is allergic to steroids, so she is not using a topical steroid for FFA  Discussed if restart oral Minoxidil but will need approval from Cardiologist,  pt is not interested in restarting Minoxidil at this point  Discussed could see some hair regrowth from the Telogen Effluvium hair loss over time without any treatment at all as she recovers from her heart attack.  Return in about 1 year (around 02/09/2024) for TBSE, Hx of BCC.  I, Othelia Pulling, RMA, am acting as scribe for Sarina Ser, MD . Documentation: I have reviewed the above documentation for accuracy and completeness, and I agree with the above.  Sarina Ser, MD

## 2023-02-11 ENCOUNTER — Encounter: Payer: Medicare Other | Admitting: *Deleted

## 2023-02-11 ENCOUNTER — Encounter: Payer: Self-pay | Admitting: Dermatology

## 2023-02-11 DIAGNOSIS — I214 Non-ST elevation (NSTEMI) myocardial infarction: Secondary | ICD-10-CM

## 2023-02-11 DIAGNOSIS — Z48812 Encounter for surgical aftercare following surgery on the circulatory system: Secondary | ICD-10-CM | POA: Diagnosis not present

## 2023-02-11 DIAGNOSIS — Z955 Presence of coronary angioplasty implant and graft: Secondary | ICD-10-CM

## 2023-02-11 NOTE — Progress Notes (Signed)
Daily Session Note  Patient Details  Name: Anne Ross MRN: [id number] Date of Birth: January 15, 1941 Referring Provider:   Flowsheet Row Cardiac Rehab from 01/10/2023 in [facility] Cardiac and Pulmonary Rehab  Referring Provider Gregary Cromer MD       Encounter Date: 02/11/2023  Check In:  Session Check In - 02/11/23 1007       Check-In   Supervising physician immediately available to respond to emergencies See telemetry face sheet for immediately available ER MD    Location [facility]-Cardiac & Pulmonary Rehab    Staff Present Heath Lark, RN, BSN, CCRP;Jessica Bethany, MA, RCEP, CCRP, CCET;Joseph [place], [place]    Virtual Visit No    Medication changes reported     No    Fall or balance concerns reported    No    Warm-up and Cool-down Performed on first and last piece of equipment    Resistance Training Performed Yes    VAD Patient? No    PAD/SET Patient? No      Pain Assessment   Currently in Pain? No/denies                Social History   Tobacco Use  Smoking Status Never  Smokeless Tobacco Never    Goals Met:  Independence with exercise equipment Exercise tolerated well No report of concerns or symptoms today  Goals Unmet:  Not Applicable  Comments: Pt able to follow exercise prescription today without complaint.  Will continue to monitor for progression.    Dr. Emily Filbert is Medical Director for [place].  Dr. Ottie Glazier is Medical Director for [facility] Pulmonary Rehabilitation.

## 2023-02-14 ENCOUNTER — Encounter: Payer: Medicare Other | Admitting: *Deleted

## 2023-02-14 DIAGNOSIS — Z48812 Encounter for surgical aftercare following surgery on the circulatory system: Secondary | ICD-10-CM | POA: Diagnosis not present

## 2023-02-14 DIAGNOSIS — Z955 Presence of coronary angioplasty implant and graft: Secondary | ICD-10-CM

## 2023-02-14 DIAGNOSIS — I214 Non-ST elevation (NSTEMI) myocardial infarction: Secondary | ICD-10-CM

## 2023-02-14 NOTE — Progress Notes (Signed)
Daily Session Note  Patient Details  Name: Anne Ross MRN: [id number] Date of Birth: [birth year] Referring Provider:   Flowsheet Row Cardiac Rehab from 01/10/2023 in [facility] Cardiac and Pulmonary Rehab  Referring Provider Gregary Cromer MD       Encounter Date: 02/14/2023  Check In:  Session Check In - 02/14/23 0946       Check-In   Supervising physician immediately available to respond to emergencies See telemetry face sheet for immediately available ER MD    Location [facility]-Cardiac & Pulmonary Rehab    Staff Present Darlyne Russian, RN, Doyce Para, BS, ACSM CEP, Exercise Physiologist;Noah Tickle, BS, Exercise Physiologist    Virtual Visit No    Medication changes reported     No    Fall or balance concerns reported    No    Warm-up and Cool-down Performed on first and last piece of equipment    Resistance Training Performed Yes    VAD Patient? No    PAD/SET Patient? No      Pain Assessment   Currently in Pain? No/denies                Social History   Tobacco Use  Smoking Status Never  Smokeless Tobacco Never    Goals Met:  Independence with exercise equipment Exercise tolerated well No report of concerns or symptoms today Strength training completed today  Goals Unmet:  Not Applicable  Comments: Pt able to follow exercise prescription today without complaint.  Will continue to monitor for progression.    Dr. Emily Filbert is Medical Director for [place].  Dr. Ottie Glazier is Medical Director for [facility] Pulmonary Rehabilitation.

## 2023-02-16 ENCOUNTER — Encounter: Payer: Medicare Other | Admitting: *Deleted

## 2023-02-16 ENCOUNTER — Encounter: Payer: Self-pay | Admitting: *Deleted

## 2023-02-16 DIAGNOSIS — Z48812 Encounter for surgical aftercare following surgery on the circulatory system: Secondary | ICD-10-CM | POA: Diagnosis not present

## 2023-02-16 DIAGNOSIS — I214 Non-ST elevation (NSTEMI) myocardial infarction: Secondary | ICD-10-CM

## 2023-02-16 DIAGNOSIS — Z955 Presence of coronary angioplasty implant and graft: Secondary | ICD-10-CM

## 2023-02-16 NOTE — Progress Notes (Signed)
Daily Session Note  Patient Details  Name: Keyleen Vandenbossche MRN: [id number] Date of Birth: 04/17/1941 Referring Provider:   Flowsheet Row Cardiac Rehab from 01/10/2023 in [facility] Cardiac and Pulmonary Rehab  Referring Provider Gregary Cromer MD       Encounter Date: 02/16/2023  Check In:  Session Check In - 02/16/23 0944       Check-In   Supervising physician immediately available to respond to emergencies See telemetry face sheet for immediately available ER MD    Location [facility]-Cardiac & Pulmonary Rehab    Staff Present Darlyne Russian, RN, ADN;Joseph Tessie Fass, RCP,RRT,BSRT;Noah Tickle, BS, Exercise Physiologist    Virtual Visit No    Medication changes reported     No    Fall or balance concerns reported    No    Warm-up and Cool-down Performed on first and last piece of equipment    Resistance Training Performed Yes    VAD Patient? No    PAD/SET Patient? No      Pain Assessment   Currently in Pain? No/denies                Social History   Tobacco Use  Smoking Status Never  Smokeless Tobacco Never    Goals Met:  Independence with exercise equipment Exercise tolerated well No report of concerns or symptoms today Strength training completed today  Goals Unmet:  Not Applicable  Comments: Pt able to follow exercise prescription today without complaint.  Will continue to monitor for progression.    Dr. Emily Filbert is Medical Director for [facility].  Dr. Ottie Glazier is Medical Director for [facility] Pulmonary Rehabilitation.

## 2023-02-16 NOTE — Progress Notes (Signed)
Cardiac Individual Treatment Plan  Patient Details  Name: Anne Ross MRN: [id number] Date of Birth: 1941/09/27 Referring Provider:   Flowsheet Row Cardiac Rehab from 01/10/2023 in [facility] Cardiac and Pulmonary Rehab  Referring Provider Gregary Cromer MD       Initial Encounter Date:  Flowsheet Row Cardiac Rehab from 01/10/2023 in [facility] Cardiac and Pulmonary Rehab  Date 01/10/23       Visit Diagnosis: NSTEMI (non-ST elevation myocardial infarction) [facility])  Status post coronary artery stent placement  Patient's Home Medications on Admission:  Current Outpatient Medications:    ascorbic acid (VITAMIN C) 1000 MG tablet, Take 1,000 mg by mouth daily., Disp: , Rfl:    aspirin EC 81 MG tablet, Take 81 mg by mouth daily., Disp: , Rfl:    atorvastatin (LIPITOR) 80 MG tablet, Take 1 tablet by mouth daily., Disp: , Rfl:    Calcipotriene 0.005 % solution, 1 Application as needed., Disp: , Rfl:    Calcium Carbonate-Vitamin D 500-125 MG-UNIT TABS, Take 1 tablet by mouth daily at 10 pm., Disp: , Rfl:    Calcium Polycarbophil (FIBER) 625 MG TABS, Take 1 tablet by mouth daily., Disp: , Rfl:    cetirizine (ZYRTEC) 10 MG tablet, Take 10 mg by mouth daily., Disp: , Rfl:    chlorhexidine (PERIDEX) 0.12 % solution, Use as directed 15 mLs in the mouth or throat as needed., Disp: , Rfl:    Cholecalciferol (VITAMIN D) 2000 units tablet, Take 2,000 Units by mouth daily., Disp: , Rfl:    clopidogrel (PLAVIX) 75 MG tablet, Take 1 tablet by mouth daily., Disp: , Rfl:    fluticasone (FLONASE) 50 MCG/ACT nasal spray, Place into both nostrils daily., Disp: , Rfl:    folic acid (FOLVITE) [id number] MCG tablet, Take 400 mcg by mouth daily., Disp: , Rfl:    heparin 25000 UT/250ML infusion, Inject 1,000 Units/hr into the vein continuous. (Patient not taking: Reported on 01/06/2023), Disp: , Rfl:    metoprolol succinate (TOPROL-XL) 50 MG 24 hr tablet, TAKE 1 TABLET(50 MG) BY MOUTH EVERY DAY, Disp: , Rfl:    nitroGLYCERIN  (NITROSTAT) 0.4 MG SL tablet, Place 1 tablet under the tongue every 5 (five) minutes as needed for chest pain (up to 3 tab)., Disp: , Rfl:    nitroGLYCERIN 0.2 mg/mL infusion, Inject 0-200 mcg/min into the vein continuous. (Patient not taking: Reported on 01/06/2023), Disp: 250 mL, Rfl:    pantoprazole (PROTONIX) 40 MG tablet, Take 40 mg by mouth daily., Disp: , Rfl:    pimecrolimus (ELIDEL) 1 % cream, Apply 1 Application topically as needed., Disp: , Rfl:    pyridoxine (B-6) 100 MG tablet, Take 100 mg by mouth daily., Disp: , Rfl:    ramipril (ALTACE) 10 MG capsule, Take 10 mg by mouth daily., Disp: , Rfl:    trimethoprim (TRIMPEX) 100 MG tablet, Take 1 tablet (100 mg total) by mouth daily. (Patient not taking: Reported on 01/06/2023), Disp: 90 tablet, Rfl: 3   TURMERIC PO, Take 1 capsule by mouth as needed., Disp: , Rfl:    vitamin B-12 (CYANOCOBALAMIN) 250 MCG tablet, Take 250 mcg by mouth daily., Disp: , Rfl:   Past Medical History: Past Medical History:  Diagnosis Date   Basal cell carcinoma    chest    Cancer ([place])    uterine   History of COVID-19    Hypertension     Tobacco Use: Social History   Tobacco Use  Smoking Status Never  Smokeless Tobacco Never  Labs: Review Flowsheet        No data to display           Exercise Target Goals: Exercise Program Goal: Individual exercise prescription set using results from initial 6 min walk test and THRR while considering  patient's activity barriers and safety.   Exercise Prescription Goal: Initial exercise prescription builds to 30-45 minutes a day of aerobic activity, 2-3 days per week.  Home exercise guidelines will be given to patient during program as part of exercise prescription that the participant will acknowledge.   Education: Aerobic Exercise: - Group verbal and visual presentation on the components of exercise prescription. Introduces F.I.T.T principle from ACSM for exercise prescriptions.  Reviews F.I.T.T.  principles of aerobic exercise including progression. Written material given at graduation.   Education: Resistance Exercise: - Group verbal and visual presentation on the components of exercise prescription. Introduces F.I.T.T principle from ACSM for exercise prescriptions  Reviews F.I.T.T. principles of resistance exercise including progression. Written material given at graduation.    Education: Exercise & Equipment Safety: - Individual verbal instruction and demonstration of equipment use and safety with use of the equipment.   Education: Exercise Physiology & General Exercise Guidelines: - Group verbal and written instruction with models to review the exercise physiology of the cardiovascular system and associated critical values. Provides general exercise guidelines with specific guidelines to those with heart or lung disease.    Education: Flexibility, Balance, Mind/Body Relaxation: - Group verbal and visual presentation with interactive activity on the components of exercise prescription. Introduces F.I.T.T principle from ACSM for exercise prescriptions. Reviews F.I.T.T. principles of flexibility and balance exercise training including progression. Also discusses the mind body connection.  Reviews various relaxation techniques to help reduce and manage stress (i.e. Deep breathing, progressive muscle relaxation, and visualization). Balance handout provided to take home. Written material given at graduation.   Activity Barriers & Risk Stratification:  Activity Barriers & Cardiac Risk Stratification - 01/10/23 1405       Activity Barriers & Cardiac Risk Stratification   Activity Barriers Other (comment);Shortness of Breath;Joint Problems;Fibromyalgia;Arthritis;Deconditioning;Muscular Weakness;Balance Concerns    Cardiac Risk Stratification Moderate             6 Minute Walk:  6 Minute Walk     Row Name 01/10/23 1354         6 Minute Walk   Phase Initial     Distance 915  feet     Walk Time 6 minutes     # of Rest Breaks 0     MPH 1.73     METS 1.91     RPE 13     Perceived Dyspnea  2     VO2 Peak 6.69     Symptoms Yes (comment)     Comments fatigue, R hip pain (chronic) 6/10, SOB     Resting HR 55 bpm     Resting BP 126/64     Resting Oxygen Saturation  98 %     Exercise Oxygen Saturation  during 6 min walk 96 %     Max Ex. HR 88 bpm     Max Ex. BP 166/74     2 Minute Post BP 122/64              Oxygen Initial Assessment:   Oxygen Re-Evaluation:   Oxygen Discharge (Final Oxygen Re-Evaluation):   Initial Exercise Prescription:  Initial Exercise Prescription - 01/10/23 1400       Date of Initial Exercise RX  and Referring Provider   Date 01/10/23    Referring Provider Gregary Cromer MD      Oxygen   Maintain Oxygen Saturation 88% or higher      Treadmill   MPH 1.2    Grade 0.5    Minutes 15    METs 2      NuStep   Level 1    SPM 80    Minutes 15    METs 2      T5 Nustep   Level 1    SPM 80    Minutes 15    METs 2      Track   Laps 23    Minutes 15    METs 2.25      Prescription Details   Frequency (times per week) 3    Duration Progress to 30 minutes of continuous aerobic without signs/symptoms of physical distress      Intensity   THRR 40-80% of Max Heartrate 89-123    Ratings of Perceived Exertion 11-13    Perceived Dyspnea 0-4      Progression   Progression Continue to progress workloads to maintain intensity without signs/symptoms of physical distress.      Resistance Training   Training Prescription Yes    Weight 2 lb    Reps 10-15             Perform Capillary Blood Glucose checks as needed.  Exercise Prescription Changes:   Exercise Prescription Changes     Row Name 01/10/23 1400 01/17/23 1300 01/28/23 0900 01/31/23 1400 02/14/23 1100     Response to Exercise   Blood Pressure (Admit) 126/64 142/78 -- 114/62 124/66   Blood Pressure (Exercise) 166/74 138/62 -- 158/64 --   Blood  Pressure (Exit) 122/64 122/62 -- 122/60 108/60   Heart Rate (Admit) 55 bpm 63 bpm -- 64 bpm 61 bpm   Heart Rate (Exercise) 88 bpm 99 bpm -- 111 bpm 89 bpm   Heart Rate (Exit) 59 bpm 54 bpm -- 70 bpm 63 bpm   Oxygen Saturation (Admit) 98 % -- -- -- --   Oxygen Saturation (Exercise) 96 % -- -- -- --   Rating of Perceived Exertion (Exercise) 13 14 -- 14 14   Perceived Dyspnea (Exercise) 2 -- -- -- --   Symptoms fatigue, R hip (chronic 6/10), SOB none -- none none   Comments walk test results 2nd full day of exercise -- -- --   Duration -- Progress to 30 minutes of  aerobic without signs/symptoms of physical distress -- Continue with 30 min of aerobic exercise without signs/symptoms of physical distress. Continue with 30 min of aerobic exercise without signs/symptoms of physical distress.   Intensity -- THRR unchanged -- THRR unchanged THRR unchanged     Progression   Progression -- Continue to progress workloads to maintain intensity without signs/symptoms of physical distress. -- Continue to progress workloads to maintain intensity without signs/symptoms of physical distress. Continue to progress workloads to maintain intensity without signs/symptoms of physical distress.   Average METs -- 2.38 -- 2.32 2.5     Resistance Training   Training Prescription -- Yes -- Yes Yes   Weight -- 2 lb -- 2 lb 2 lb   Reps -- 10-15 -- 10-15 10-15     Interval Training   Interval Training -- No -- No No     Treadmill   MPH -- 2.5 -- 2.5 2   Grade -- 0 -- 0 1  Minutes -- 15 -- 15 15   METs -- 2.91 -- 2.91 2.81     NuStep   Level -- 3 -- 1 5   Minutes -- 15 -- 15 15   METs -- -- -- -- 2.8     T5 Nustep   Level -- -- -- 1 1   Minutes -- -- -- 15 15   METs -- -- -- 2.1 2.3     Biostep-RELP   Level -- -- -- 1 --   Minutes -- -- -- 15 --   METs -- -- -- 2 --     Home Exercise Plan   Plans to continue exercise at -- -- Home (comment)  walking, treadmill, staff videos Home (comment)  walking,  treadmill, staff videos Home (comment)  walking, treadmill, staff videos   Frequency -- -- Add 2 additional days to program exercise sessions. Add 2 additional days to program exercise sessions. Add 2 additional days to program exercise sessions.   Initial Home Exercises Provided -- -- 01/28/23 01/28/23 01/28/23     Oxygen   Maintain Oxygen Saturation -- 88% or higher -- 88% or higher 88% or higher            Exercise Comments:   Exercise Comments     Row Name 01/12/23 1005           Exercise Comments First full day of exercise!  Patient was oriented to gym and equipment including functions, settings, policies, and procedures.  Patient's individual exercise prescription and treatment plan were reviewed.  All starting workloads were established based on the results of the 6 minute walk test done at initial orientation visit.  The plan for exercise progression was also introduced and progression will be customized based on patient's performance and goals.                Exercise Goals and Review:   Exercise Goals     Row Name 01/10/23 1409             Exercise Goals   Increase Physical Activity Yes       Intervention Provide advice, education, support and counseling about physical activity/exercise needs.;Develop an individualized exercise prescription for aerobic and resistive training based on initial evaluation findings, risk stratification, comorbidities and participant's personal goals.       Expected Outcomes Short Term: Attend rehab on a regular basis to increase amount of physical activity.;Long Term: Add in home exercise to make exercise part of routine and to increase amount of physical activity.;Long Term: Exercising regularly at least 3-5 days a week.       Increase Strength and Stamina Yes       Intervention Provide advice, education, support and counseling about physical activity/exercise needs.;Develop an individualized exercise prescription for aerobic and  resistive training based on initial evaluation findings, risk stratification, comorbidities and participant's personal goals.       Expected Outcomes Short Term: Increase workloads from initial exercise prescription for resistance, speed, and METs.;Short Term: Perform resistance training exercises routinely during rehab and add in resistance training at home;Long Term: Improve cardiorespiratory fitness, muscular endurance and strength as measured by increased METs and functional capacity (6MWT)       Able to understand and use rate of perceived exertion (RPE) scale Yes       Intervention Provide education and explanation on how to use RPE scale       Expected Outcomes Short Term: Able to use RPE daily in  rehab to express subjective intensity level;Long Term:  Able to use RPE to guide intensity level when exercising independently       Able to understand and use Dyspnea scale Yes       Intervention Provide education and explanation on how to use Dyspnea scale       Expected Outcomes Short Term: Able to use Dyspnea scale daily in rehab to express subjective sense of shortness of breath during exertion;Long Term: Able to use Dyspnea scale to guide intensity level when exercising independently       Knowledge and understanding of Target Heart Rate Range (THRR) Yes       Intervention Provide education and explanation of THRR including how the numbers were predicted and where they are located for reference       Expected Outcomes Short Term: Able to state/look up THRR;Short Term: Able to use daily as guideline for intensity in rehab;Long Term: Able to use THRR to govern intensity when exercising independently       Able to check pulse independently Yes       Intervention Provide education and demonstration on how to check pulse in carotid and radial arteries.;Review the importance of being able to check your own pulse for safety during independent exercise       Expected Outcomes Short Term: Able to explain  why pulse checking is important during independent exercise;Long Term: Able to check pulse independently and accurately       Understanding of Exercise Prescription Yes       Intervention Provide education, explanation, and written materials on patient's individual exercise prescription       Expected Outcomes Short Term: Able to explain program exercise prescription;Long Term: Able to explain home exercise prescription to exercise independently                Exercise Goals Re-Evaluation :  Exercise Goals Re-Evaluation     Row Name 01/12/23 1005 01/17/23 1354 01/28/23 0934 01/31/23 1453 02/14/23 1143     Exercise Goal Re-Evaluation   Exercise Goals Review Able to understand and use rate of perceived exertion (RPE) scale;Able to understand and use Dyspnea scale;Knowledge and understanding of Target Heart Rate Range (THRR);Understanding of Exercise Prescription Increase Physical Activity;Increase Strength and Stamina;Understanding of Exercise Prescription Increase Physical Activity;Increase Strength and Stamina;Understanding of Exercise Prescription;Able to understand and use rate of perceived exertion (RPE) scale;Able to understand and use Dyspnea scale;Knowledge and understanding of Target Heart Rate Range (THRR);Able to check pulse independently Increase Physical Activity;Increase Strength and Stamina;Understanding of Exercise Prescription Increase Physical Activity;Increase Strength and Stamina;Understanding of Exercise Prescription   Comments Reviewed RPE scale, THR and program prescription with pt today.  Pt voiced understanding and was given a copy of goals to take home. Yanet did well for her first full session of rehab. She was able to complete her full initial exercise prescription and was even able to increase her treadmill speed to 2.5 mph and increase to level 3 on the T4 Nustep. We will continue to monitor as she progresses. [name] is off to a good start in rehab.  She is already  starting to feel better overall and improve her stamina.  Reviewed home exercise with pt today.  Pt plans to walk and use treadmill at home for exercise.  She is also planning to purchase weights to use.  We also talked about the staff vidoes on YouTube.  Reviewed THR, pulse, RPE, sign and symptoms, pulse oximetery and when to call 911 or  MD.  Also discussed weather considerations and indoor options.  Pt voiced understanding. [name] is doing well in the program. She has continued to walk the treadmill at a speed of 2.5 mph with no incline, although her speed did fluctuate down to 1.9 mph during a few sessions. She also has continued to work at level 1 on all seated machines, but could benefit from increasing her workloads. We will continue to monitor her progress in the program. [name] continues to do well in rehab. She increased to level 5 on the T4 Nustep. She did add a 1% incline to her treadmill, however, she decreased her speed. Staff will encourage her to increase that back up. She cuold benefit from increasing to level 2 on the T5 Nustep. Will continue to monitor.   Expected Outcomes Short: Use RPE daily to regulate intensity. Long: Follow program prescription in THR. Short: Continue to follow initial exercise prescription Long: Increase overall MET level and stamina Short: start to add in more exercise at home Long: Conitnue to exercise independent Short: Keep treadmill workload consistent, progressively increase workloads on seated machines. Long: Continue to improve strength and stamina. Short: Increase to level 2 on the T5 Nustep Long: Continue to increase overall MET level and stamina            Discharge Exercise Prescription (Final Exercise Prescription Changes):  Exercise Prescription Changes - 02/14/23 1100       Response to Exercise   Blood Pressure (Admit) 124/66    Blood Pressure (Exit) 108/60    Heart Rate (Admit) 61 bpm    Heart Rate (Exercise) 89 bpm    Heart Rate (Exit) 63 bpm     Rating of Perceived Exertion (Exercise) 14    Symptoms none    Duration Continue with 30 min of aerobic exercise without signs/symptoms of physical distress.    Intensity THRR unchanged      Progression   Progression Continue to progress workloads to maintain intensity without signs/symptoms of physical distress.    Average METs 2.5      Resistance Training   Training Prescription Yes    Weight 2 lb    Reps 10-15      Interval Training   Interval Training No      Treadmill   MPH 2    Grade 1    Minutes 15    METs 2.81      NuStep   Level 5    Minutes 15    METs 2.8      T5 Nustep   Level 1    Minutes 15    METs 2.3      Home Exercise Plan   Plans to continue exercise at Home (comment)   walking, treadmill, staff videos   Frequency Add 2 additional days to program exercise sessions.    Initial Home Exercises Provided 01/28/23      Oxygen   Maintain Oxygen Saturation 88% or higher             Nutrition:  Target Goals: Understanding of nutrition guidelines, daily intake of sodium 1500mg , cholesterol 200mg , calories 30% from fat and 7% or less from saturated fats, daily to have 5 or more servings of fruits and vegetables.  Education: All About Nutrition: -Group instruction provided by verbal, written material, interactive activities, discussions, models, and posters to present general guidelines for heart healthy nutrition including fat, fiber, MyPlate, the role of sodium in heart healthy nutrition, utilization of the nutrition label, and  utilization of this knowledge for meal planning. Follow up email sent as well. Written material given at graduation.   Biometrics:  Pre Biometrics - 01/10/23 1410       Pre Biometrics   Height 5' 4.9" (1.648 m)    Weight 147 lb 8 oz (66.9 kg)    Waist Circumference 33 inches    Hip Circumference 39.5 inches    Waist to Hip Ratio 0.84 %    BMI (Calculated) 24.63    Single Leg Stand 1.9 seconds               Nutrition Therapy Plan and Nutrition Goals:  Nutrition Therapy & Goals - 01/20/23 1033       Nutrition Therapy   Diet Heart healthy, low Na    Drug/Food Interactions Statins/Certain Fruits    Protein (specify units) 80g    Fiber 25 grams    Whole Grain Foods 3 servings    Saturated Fats 12 max. grams    Fruits and Vegetables 8 servings/day    Sodium 2 grams      Personal Nutrition Goals   Nutrition Goal ST: review paperwork, switch to whole wheat bread, add nuts/seeds to breakfast and/or swap to higher protein yogurt LT:  meet protein needs, include 20-25g of fiber/day, continue with heart healthy changes    Comments 82 y.o. F admitted to cardiac rehab s/p NSTEMI with stent placement. PMHx inlcudes HTN, HLD, recurrent UTIs, CAD, fibromyalgia, IBS, migraines, osteoarthritis, osteopenia. Reviewed relevant medications: lipitor, vit C, vit D3, vit 123456, vit B6, folic acid, tumeric extract, protonix. [name] reports eating many vegetables and salads, limiting sweets - except for piece of chocolate she has 1x/day with a cup of coffee, she limits fried foods and lots of fat with food, she likes to grill and cook food in a pan with no additional fat or bake with some olive oil. Since her heart event, her appetite was dulled and she didn't eat much the first couple of weeks s/p heart event; [name] does report that her appetite is normally smaller regardless. B: yogurt with some cereal such as cheerios L: peanut butter and jelly1/2 or whole sandiwch or 1/2 pimento cheese sandwich with some chips and an apple D: last night: chicken and salad with some beans. She reports limiting potatoes and will instead have rice. She makes pasta salad in the summer. She enjoys fruit and eats more melons in the summer. She uses salt while cooking, but not afterwards. She chooses lean meat, pork tenderloin, and chicken. She does not snack during the day normally. Drinks: grapefruit juice, coffee with cream, water as well as  some crystal lite, decaf tea with splenda. Discussed heart healthy eating.      Intervention Plan   Intervention Prescribe, educate and counsel regarding individualized specific dietary modifications aiming towards targeted core components such as weight, hypertension, lipid management, diabetes, heart failure and other comorbidities.    Expected Outcomes Short Term Goal: Understand basic principles of dietary content, such as calories, fat, sodium, cholesterol and nutrients.;Short Term Goal: A plan has been developed with personal nutrition goals set during dietitian appointment.;Long Term Goal: Adherence to prescribed nutrition plan.             Nutrition Assessments:  MEDIFICTS Score Key: ?70 Need to make dietary changes  40-70 Heart Healthy Diet ? 40 Therapeutic Level Cholesterol Diet  Flowsheet Row Cardiac Rehab from 01/10/2023 in [facility] Cardiac and Pulmonary Rehab  Picture Your Plate Total Score on Admission 68  Picture Your Plate Scores: [id number] Unhealthy dietary pattern with much room for improvement. 41-50 Dietary pattern unlikely to meet recommendations for good health and room for improvement. 51-60 More healthful dietary pattern, with some room for improvement.  >60 Healthy dietary pattern, although there may be some specific behaviors that could be improved.    Nutrition Goals Re-Evaluation:   Nutrition Goals Discharge (Final Nutrition Goals Re-Evaluation):   Psychosocial: Target Goals: Acknowledge presence or absence of significant depression and/or stress, maximize coping skills, provide positive support system. Participant is able to verbalize types and ability to use techniques and skills needed for reducing stress and depression.   Education: Stress, Anxiety, and Depression - Group verbal and visual presentation to define topics covered.  Reviews how body is impacted by stress, anxiety, and depression.  Also discusses healthy ways to reduce stress and to  treat/manage anxiety and depression.  Written material given at graduation.   Education: Sleep Hygiene -Provides group verbal and written instruction about how sleep can affect your health.  Define sleep hygiene, discuss sleep cycles and impact of sleep habits. Review good sleep hygiene tips.    Initial Review & Psychosocial Screening:  Initial Psych Review & Screening - 01/06/23 1552       Initial Review   Current issues with None Identified      Family Dynamics   Good Support System? Yes   husband  and  daughter     Barriers   Psychosocial barriers to participate in program There are no identifiable barriers or psychosocial needs.      Screening Interventions   Interventions Encouraged to exercise;To provide support and resources with identified psychosocial needs;Provide feedback about the scores to participant    Expected Outcomes Short Term goal: Utilizing psychosocial counselor, staff and physician to assist with identification of specific Stressors or current issues interfering with healing process. Setting desired goal for each stressor or current issue identified.;Long Term Goal: Stressors or current issues are controlled or eliminated.;Short Term goal: Identification and review with participant of any Quality of Life or Depression concerns found by scoring the questionnaire.;Long Term goal: The participant improves quality of Life and PHQ9 Scores as seen by post scores and/or verbalization of changes             Quality of Life Scores:   Quality of Life - 01/10/23 1410       Quality of Life   Select Quality of Life      Quality of Life Scores   Health/Function Pre 22.4 %    Socioeconomic Pre 30 %    Psych/Spiritual Pre 26.57 %    Family Pre 30 %    GLOBAL Pre 25.94 %            Scores of 19 and below usually indicate a poorer quality of life in these areas.  A difference of  2-3 points is a clinically meaningful difference.  A difference of 2-3 points in the  total score of the Quality of Life Index has been associated with significant improvement in overall quality of life, self-image, physical symptoms, and general health in studies assessing change in quality of life.  PHQ-9: Review Flowsheet       02/04/2023 01/10/2023  Depression screen PHQ 2/9  Decreased Interest 0 0  Down, Depressed, Hopeless 0 0  PHQ - 2 Score 0 0  Altered sleeping 0 0  Tired, decreased energy 1 3  Change in appetite 0 2  Feeling bad or failure about  yourself  0 0  Trouble concentrating 0 0  Moving slowly or fidgety/restless 0 2  Suicidal thoughts 0 0  PHQ-9 Score 1 7  Difficult doing work/chores Not difficult at all Somewhat difficult   Interpretation of Total Score  Total Score Depression Severity:  1-4 = Minimal depression, 5-9 = Mild depression, 10-14 = Moderate depression, 15-19 = Moderately severe depression, 20-27 = Severe depression   Psychosocial Evaluation and Intervention:  Psychosocial Evaluation - 01/06/23 1602       Psychosocial Evaluation & Interventions   Interventions Encouraged to exercise with the program and follow exercise prescription    Comments Bralyn has no barriers to attending the program. She lives with her husband and her duaghter is working at home form their home at this time. Her support is her husband and her daughter.   She is ready to get started with the program.    Expected Outcomes STG  Elease is able to attend all scheduled sessions.  She is able to progress with her exercise . LTG Hillory continues her exercise progression after discharge    Continue Psychosocial Services  Follow up required by staff             Psychosocial Re-Evaluation:  Psychosocial Re-Evaluation     [place] Name 01/28/23 [phone] 02/04/23 0954           Psychosocial Re-Evaluation   Current issues with Current Stress Concerns Current Stress Concerns      Comments [name] is off to a good start in rehab.  She does not feel like she has any  stressors in life.  She does her best to not let anything get to her too much.  She sees things that she can't do but now is forming her own opinion about how things should be done.  She is sleeping well.  She has noted occassional night sweats since her event. Reviewed patient health questionnaire (PHQ-9) with patient for follow up. Previously, patients score indicated signs/symptoms of depression.  Reviewed to see if patient is improving symptom wise while in program.  Score improved and patient states that it is because they have doing more and feeling better.      Expected Outcomes Short: Continue to build stamina to get back to self Long; Conitnue to stay positive Short: Continue to attend rehab to build stamina Long: Conitnue to stay positive      Interventions Encouraged to attend Cardiac Rehabilitation for the exercise Encouraged to attend Cardiac Rehabilitation for the exercise      Continue Psychosocial Services  Follow up required by staff --               Psychosocial Discharge (Final Psychosocial Re-Evaluation):  Psychosocial Re-Evaluation - 02/04/23 0954       Psychosocial Re-Evaluation   Current issues with Current Stress Concerns    Comments Reviewed patient health questionnaire (PHQ-9) with patient for follow up. Previously, patients score indicated signs/symptoms of depression.  Reviewed to see if patient is improving symptom wise while in program.  Score improved and patient states that it is because they have doing more and feeling better.    Expected Outcomes Short: Continue to attend rehab to build stamina Long: Conitnue to stay positive    Interventions Encouraged to attend Cardiac Rehabilitation for the exercise             Vocational Rehabilitation: Provide vocational rehab assistance to qualifying candidates.   Vocational Rehab Evaluation & Intervention:  Vocational Rehab - 01/06/23  1557       Initial Vocational Rehab Evaluation & Intervention   Assessment  shows need for Vocational Rehabilitation No      Vocational Rehab Re-Evaulation   Comments retired             Education: Education Goals: Education classes will be provided on a variety of topics geared toward better understanding of heart health and risk factor modification. Participant will state understanding/return demonstration of topics presented as noted by education test scores.  Learning Barriers/Preferences:  Learning Barriers/Preferences - 01/06/23 1556       Learning Barriers/Preferences   Learning Barriers None    Learning Preferences None             General Cardiac Education Topics:  AED/CPR: - Group verbal and written instruction with the use of models to demonstrate the basic use of the AED with the basic ABC's of resuscitation.   Anatomy and Cardiac Procedures: - Group verbal and visual presentation and models provide information about basic cardiac anatomy and function. Reviews the testing methods done to diagnose heart disease and the outcomes of the test results. Describes the treatment choices: Medical Management, Angioplasty, or Coronary Bypass Surgery for treating various heart conditions including Myocardial Infarction, Angina, Valve Disease, and Cardiac Arrhythmias.  Written material given at graduation.   Medication Safety: - Group verbal and visual instruction to review commonly prescribed medications for heart and lung disease. Reviews the medication, class of the drug, and side effects. Includes the steps to properly store meds and maintain the prescription regimen.  Written material given at graduation. Flowsheet Row Cardiac Rehab from 02/02/2023 in [facility] Cardiac and Pulmonary Rehab  Date 01/19/23  Educator MS  Instruction Review Code 1- Verbalizes Understanding       Intimacy: - Group verbal instruction through game format to discuss how heart and lung disease can affect sexual intimacy. Written material given at graduation..   Know  Your Numbers and Heart Failure: - Group verbal and visual instruction to discuss disease risk factors for cardiac and pulmonary disease and treatment options.  Reviews associated critical values for Overweight/Obesity, Hypertension, Cholesterol, and Diabetes.  Discusses basics of heart failure: signs/symptoms and treatments.  Introduces Heart Failure Zone chart for action plan for heart failure.  Written material given at graduation. Flowsheet Row Cardiac Rehab from 02/02/2023 in [facility] Cardiac and Pulmonary Rehab  Date 01/26/23  Educator [facility]  Instruction Review Code 1- Verbalizes Understanding       Infection Prevention: - Provides verbal and written material to individual with discussion of infection control including proper hand washing and proper equipment cleaning during exercise session.   Falls Prevention: - Provides verbal and written material to individual with discussion of falls prevention and safety. Flowsheet Row Cardiac Rehab from 02/02/2023 in [facility] Cardiac and Pulmonary Rehab  Date 01/06/23  Educator SB  Instruction Review Code 1- Verbalizes Understanding       Other: -Provides group and verbal instruction on various topics (see comments) Flowsheet Row Cardiac Rehab from 02/02/2023 in [facility] Cardiac and Pulmonary Rehab  Date 01/12/23  Educator SB  Instruction Review Code 1- Verbalizes Understanding       Knowledge Questionnaire Score:  Knowledge Questionnaire Score - 01/10/23 1411       Knowledge Questionnaire Score   Pre Score 19/26             Core Components/Risk Factors/Patient Goals at Admission:  Personal Goals and Risk Factors at Admission - 01/10/23 1411  Core Components/Risk Factors/Patient Goals on Admission    Weight Management Yes;Obesity;Weight Loss    Intervention Weight Management: Develop a combined nutrition and exercise program designed to reach desired caloric intake, while maintaining appropriate intake of nutrient and fiber, sodium  and fats, and appropriate energy expenditure required for the weight goal.;Weight Management: Provide education and appropriate resources to help participant work on and attain dietary goals.;Weight Management/Obesity: Establish reasonable short term and long term weight goals.;Obesity: Provide education and appropriate resources to help participant work on and attain dietary goals.    Admit Weight 147 lb 8 oz (66.9 kg)    Goal Weight: Short Term 143 lb (64.9 kg)    Goal Weight: Long Term 140 lb (63.5 kg)    Expected Outcomes Short Term: Continue to assess and modify interventions until short term weight is achieved;Long Term: Adherence to nutrition and physical activity/exercise program aimed toward attainment of established weight goal;Weight Loss: Understanding of general recommendations for a balanced deficit meal plan, which promotes 1-2 lb weight loss per week and includes a negative energy balance of [phone] kcal/d;Understanding recommendations for meals to include 15-35% energy as protein, 25-35% energy from fat, 35-60% energy from carbohydrates, less than 200mg  of dietary cholesterol, 20-35 gm of total fiber daily;Understanding of distribution of calorie intake throughout the day with the consumption of 4-5 meals/snacks    Diabetes Yes    Intervention Provide education about signs/symptoms and action to take for hypo/hyperglycemia.;Provide education about proper nutrition, including hydration, and aerobic/resistive exercise prescription along with prescribed medications to achieve blood glucose in normal ranges: Fasting glucose 65-99 mg/dL    Expected Outcomes Short Term: Participant verbalizes understanding of the signs/symptoms and immediate care of hyper/hypoglycemia, proper foot care and importance of medication, aerobic/resistive exercise and nutrition plan for blood glucose control.;Long Term: Attainment of HbA1C < 7%.    Hypertension Yes    Intervention Provide education on lifestyle  modifcations including regular physical activity/exercise, weight management, moderate sodium restriction and increased consumption of fresh fruit, vegetables, and low fat dairy, alcohol moderation, and smoking cessation.;Monitor prescription use compliance.    Expected Outcomes Short Term: Continued assessment and intervention until BP is < 140/89mm HG in hypertensive participants. < 130/82mm HG in hypertensive participants with diabetes, heart failure or chronic kidney disease.;Long Term: Maintenance of blood pressure at goal levels.    Lipids Yes    Intervention Provide education and support for participant on nutrition & aerobic/resistive exercise along with prescribed medications to achieve LDL 70mg , HDL >40mg .    Expected Outcomes Short Term: Participant states understanding of desired cholesterol values and is compliant with medications prescribed. Participant is following exercise prescription and nutrition guidelines.;Long Term: Cholesterol controlled with medications as prescribed, with individualized exercise RX and with personalized nutrition plan. Value goals: LDL < 70mg , HDL > 40 mg.             Education:Diabetes - Individual verbal and written instruction to review signs/symptoms of diabetes, desired ranges of glucose level fasting, after meals and with exercise. Acknowledge that pre and post exercise glucose checks will be done for 3 sessions at entry of program.   Core Components/Risk Factors/Patient Goals Review:   Goals and Risk Factor Review     Row Name 01/28/23 0949             Core Components/Risk Factors/Patient Goals Review   Personal Goals Review Weight Management/Obesity;Hypertension;Lipids;Diabetes       Review [name] is doing well in rehab.  Her weight is staying steady  for the most part.  She was up today as they went out to eat yesterday for a birthday celebration. Her blood sugars are doing well and she checks them daily at home.  Her blood pressures have  been runing pretty good in class as well as at home.  Her family has been helping her keep a close eye on both of them.  She is feeling better since her last tweak in medications.       Expected Outcomes Short: Continue to keep eye on sugars and pressures Long: Conitnue to monitor risk factors.                Core Components/Risk Factors/Patient Goals at Discharge (Final Review):   Goals and Risk Factor Review - 01/28/23 0949       Core Components/Risk Factors/Patient Goals Review   Personal Goals Review Weight Management/Obesity;Hypertension;Lipids;Diabetes    Review [name] is doing well in rehab.  Her weight is staying steady for the most part.  She was up today as they went out to eat yesterday for a birthday celebration. Her blood sugars are doing well and she checks them daily at home.  Her blood pressures have been runing pretty good in class as well as at home.  Her family has been helping her keep a close eye on both of them.  She is feeling better since her last tweak in medications.    Expected Outcomes Short: Continue to keep eye on sugars and pressures Long: Conitnue to monitor risk factors.             ITP Comments:  ITP Comments     Row Name 01/06/23 1601 01/10/23 1352 01/12/23 1005 01/19/23 1202 01/20/23 1033   ITP Comments Virtual orientation call completed today. shehas an appointment on Date: 01/10/2023  for EP eval and gym Orientation.  Documentation of diagnosis can be found in [facility] Date: 12/17/2022 . Completed 6MWT and gym orientation. Initial ITP created and sent for review to Dr. Emily Filbert, Medical Director. First full day of exercise!  Patient was oriented to gym and equipment including functions, settings, policies, and procedures.  Patient's individual exercise prescription and treatment plan were reviewed.  All starting workloads were established based on the results of the 6 minute walk test done at initial orientation visit.  The plan for exercise progression was  also introduced and progression will be customized based on patient's performance and goals. 30 day review completed. ITP sent to Dr. Emily Filbert, Medical Director of Cardiac Rehab. Continue with ITP unless changes are made by physician.  Recently started on 01/10/23. Completed initial RD consultation    [place] Name 02/16/23 0754           ITP Comments 30 Day review completed. Medical Director ITP review done, changes made as directed, and signed approval by Medical Director.                Comments:

## 2023-02-18 ENCOUNTER — Encounter: Payer: Medicare Other | Admitting: *Deleted

## 2023-02-18 DIAGNOSIS — Z955 Presence of coronary angioplasty implant and graft: Secondary | ICD-10-CM

## 2023-02-18 DIAGNOSIS — Z48812 Encounter for surgical aftercare following surgery on the circulatory system: Secondary | ICD-10-CM | POA: Diagnosis not present

## 2023-02-18 DIAGNOSIS — I214 Non-ST elevation (NSTEMI) myocardial infarction: Secondary | ICD-10-CM

## 2023-02-18 NOTE — Progress Notes (Signed)
Daily Session Note  Patient Details  Name: Anne Ross MRN: [id number] Date of Birth: 23-Feb-1941 Referring Provider:   Flowsheet Row Cardiac Rehab from 01/10/2023 in [facility] Cardiac and Pulmonary Rehab  Referring Provider Gregary Cromer MD       Encounter Date: 02/18/2023  Check In:  Session Check In - 02/18/23 1007       Check-In   Supervising physician immediately available to respond to emergencies See telemetry face sheet for immediately available ER MD    Location [facility]-Cardiac & Pulmonary Rehab    Staff Present Heath Lark, RN, BSN, CCRP;Jessica [place], MA, RCEP, CCRP, CCET;Joseph [place], [place]    Virtual Visit No    Medication changes reported     No    Fall or balance concerns reported    No    Warm-up and Cool-down Performed on first and last piece of equipment    Resistance Training Performed Yes    VAD Patient? No    PAD/SET Patient? No      Pain Assessment   Currently in Pain? No/denies                Social History   Tobacco Use  Smoking Status Never  Smokeless Tobacco Never    Goals Met:  Independence with exercise equipment Exercise tolerated well No report of concerns or symptoms today  Goals Unmet:  Not Applicable  Comments: Pt able to follow exercise prescription today without complaint.  Will continue to monitor for progression.    Dr. Emily Filbert is Medical Director for [place].  Dr. Ottie Glazier is Medical Director for [facility] Pulmonary Rehabilitation.

## 2023-02-21 ENCOUNTER — Encounter: Payer: Medicare Other | Admitting: *Deleted

## 2023-02-21 DIAGNOSIS — Z48812 Encounter for surgical aftercare following surgery on the circulatory system: Secondary | ICD-10-CM | POA: Diagnosis not present

## 2023-02-21 DIAGNOSIS — Z955 Presence of coronary angioplasty implant and graft: Secondary | ICD-10-CM

## 2023-02-21 DIAGNOSIS — I214 Non-ST elevation (NSTEMI) myocardial infarction: Secondary | ICD-10-CM

## 2023-02-21 NOTE — Progress Notes (Signed)
Daily Session Note  Patient Details  Name: Anne Ross MRN: [id number] Date of Birth: Jun 14, 1941 Referring Provider:   Flowsheet Row Cardiac Rehab from 01/10/2023 in [facility] Cardiac and Pulmonary Rehab  Referring Provider Gregary Cromer MD       Encounter Date: 02/21/2023  Check In:  Session Check In - 02/21/23 0932       Check-In   Supervising physician immediately available to respond to emergencies See telemetry face sheet for immediately available ER MD    Location [facility]-Cardiac & Pulmonary Rehab    Staff Present Darlyne Russian, RN, Doyce Para, BS, ACSM CEP, Exercise Physiologist;Noah Tickle, BS, Exercise Physiologist    Virtual Visit No    Medication changes reported     No    Fall or balance concerns reported    No    Warm-up and Cool-down Performed on first and last piece of equipment    Resistance Training Performed Yes    VAD Patient? No    PAD/SET Patient? No      Pain Assessment   Currently in Pain? No/denies                Social History   Tobacco Use  Smoking Status Never  Smokeless Tobacco Never    Goals Met:  Independence with exercise equipment Exercise tolerated well No report of concerns or symptoms today Strength training completed today  Goals Unmet:  Not Applicable  Comments: Pt able to follow exercise prescription today without complaint.  Will continue to monitor for progression.    Dr. Emily Filbert is Medical Director for [place].  Dr. Ottie Glazier is Medical Director for [facility] Pulmonary Rehabilitation.

## 2023-02-23 ENCOUNTER — Encounter: Payer: Medicare Other | Admitting: *Deleted

## 2023-02-23 DIAGNOSIS — Z48812 Encounter for surgical aftercare following surgery on the circulatory system: Secondary | ICD-10-CM | POA: Diagnosis not present

## 2023-02-23 DIAGNOSIS — I214 Non-ST elevation (NSTEMI) myocardial infarction: Secondary | ICD-10-CM

## 2023-02-23 DIAGNOSIS — Z955 Presence of coronary angioplasty implant and graft: Secondary | ICD-10-CM

## 2023-02-23 NOTE — Progress Notes (Signed)
Daily Session Note  Patient Details  Name: Anne Ross MRN: [id number] Date of Birth: 1941-11-25 Referring Provider:   Flowsheet Row Cardiac Rehab from 01/10/2023 in [facility] Cardiac and Pulmonary Rehab  Referring Provider Gregary Cromer MD       Encounter Date: 02/23/2023  Check In:  Session Check In - 02/23/23 0929       Check-In   Supervising physician immediately available to respond to emergencies See telemetry face sheet for immediately available ER MD    Location [facility]-Cardiac & Pulmonary Rehab    Staff Present Hope Budds MS, RDN, LDN;Joseph [place], RCP,RRT,BSRT;Noah Tickle, BS, Exercise Physiologist;Megan Tamala Julian, RN, Diamond Nickel RN, BSN    Virtual Visit No    Medication changes reported     No    Fall or balance concerns reported    No    Tobacco Cessation No Change    Warm-up and Cool-down Performed on first and last piece of equipment    Resistance Training Performed Yes    VAD Patient? No    PAD/SET Patient? No      Pain Assessment   Currently in Pain? No/denies                Social History   Tobacco Use  Smoking Status Never  Smokeless Tobacco Never    Goals Met:  Independence with exercise equipment Exercise tolerated well No report of concerns or symptoms today Strength training completed today  Goals Unmet:  Not Applicable  Comments: Pt able to follow exercise prescription today without complaint.  Will continue to monitor for progression.    Dr. Emily Filbert is Medical Director for [place].  Dr. Ottie Glazier is Medical Director for [facility] Pulmonary Rehabilitation.

## 2023-02-28 ENCOUNTER — Encounter: Payer: Medicare Other | Attending: Cardiology | Admitting: *Deleted

## 2023-02-28 DIAGNOSIS — I214 Non-ST elevation (NSTEMI) myocardial infarction: Secondary | ICD-10-CM | POA: Diagnosis present

## 2023-02-28 DIAGNOSIS — Z955 Presence of coronary angioplasty implant and graft: Secondary | ICD-10-CM | POA: Diagnosis present

## 2023-02-28 NOTE — Progress Notes (Signed)
Daily Session Note  Patient Details  Name: Anne Ross MRN: [id number] Date of Birth: 1941/02/07 Referring Provider:   Flowsheet Row Cardiac Rehab from 01/10/2023 in [facility] Cardiac and Pulmonary Rehab  Referring Provider Gregary Cromer MD       Encounter Date: 02/28/2023  Check In:  Session Check In - 02/28/23 0948       Check-In   Supervising physician immediately available to respond to emergencies See telemetry face sheet for immediately available ER MD    Location [facility]-Cardiac & Pulmonary Rehab    Staff Present Darlyne Russian, RN, Doyce Para, BS, ACSM CEP, Exercise Physiologist;Joseph Tessie Fass, [place]    Virtual Visit No    Medication changes reported     No    Fall or balance concerns reported    No    Warm-up and Cool-down Performed on first and last piece of equipment    Resistance Training Performed Yes    VAD Patient? No    PAD/SET Patient? No      Pain Assessment   Currently in Pain? No/denies                Social History   Tobacco Use  Smoking Status Never  Smokeless Tobacco Never    Goals Met:  Independence with exercise equipment Exercise tolerated well No report of concerns or symptoms today Strength training completed today  Goals Unmet:  Not Applicable  Comments: Pt able to follow exercise prescription today without complaint.  Will continue to monitor for progression.    Dr. Emily Filbert is Medical Director for [place].  Dr. Ottie Glazier is Medical Director for [facility] Pulmonary Rehabilitation.

## 2023-03-02 ENCOUNTER — Encounter: Payer: Medicare Other | Admitting: *Deleted

## 2023-03-02 DIAGNOSIS — Z955 Presence of coronary angioplasty implant and graft: Secondary | ICD-10-CM

## 2023-03-02 DIAGNOSIS — I214 Non-ST elevation (NSTEMI) myocardial infarction: Secondary | ICD-10-CM

## 2023-03-02 NOTE — Progress Notes (Signed)
Daily Session Note  Patient Details  Name: Anne Ross MRN: [id number] Date of Birth: [birth year] Referring Provider:   Flowsheet Row Cardiac Rehab from 01/10/2023 in [facility] Cardiac and Pulmonary Rehab  Referring Provider Gregary Cromer MD       Encounter Date: 03/02/2023  Check In:  Session Check In - 03/02/23 0930       Check-In   Supervising physician immediately available to respond to emergencies See telemetry face sheet for immediately available ER MD    Location [facility]-Cardiac & Pulmonary Rehab    Staff Present Darlyne Russian, RN, ADN;Joseph Tessie Fass, RCP,RRT,BSRT;Noah Tickle, BS, Exercise Physiologist    Virtual Visit No    Medication changes reported     No    Fall or balance concerns reported    No    Warm-up and Cool-down Performed on first and last piece of equipment    Resistance Training Performed Yes    VAD Patient? No    PAD/SET Patient? No      Pain Assessment   Currently in Pain? No/denies                Social History   Tobacco Use  Smoking Status Never  Smokeless Tobacco Never    Goals Met:  Independence with exercise equipment Exercise tolerated well No report of concerns or symptoms today Strength training completed today  Goals Unmet:  Not Applicable  Comments: Pt able to follow exercise prescription today without complaint.  Will continue to monitor for progression.    Dr. Emily Filbert is Medical Director for [place].  Dr. Ottie Glazier is Medical Director for [facility] Pulmonary Rehabilitation.

## 2023-03-04 DIAGNOSIS — Z955 Presence of coronary angioplasty implant and graft: Secondary | ICD-10-CM

## 2023-03-04 DIAGNOSIS — I214 Non-ST elevation (NSTEMI) myocardial infarction: Secondary | ICD-10-CM

## 2023-03-04 NOTE — Progress Notes (Signed)
Daily Session Note  Patient Details  Name: Anne Ross MRN: [id number] Date of Birth: 16-Jul-1941 Referring Provider:   Flowsheet Row Cardiac Rehab from 01/10/2023 in [facility] Cardiac and Pulmonary Rehab  Referring Provider Massie Maroon MD       Encounter Date: 03/04/2023  Check In:  Session Check In - 03/04/23 0912       Check-In   Supervising physician immediately available to respond to emergencies See telemetry face sheet for immediately available ER MD    Location [facility]-Cardiac & Pulmonary Rehab    Staff Present Darcel Bayley, RN,BC,MSN;Joseph Bath, RCP,RRT,BSRT;Noah Barnard, [place], Exercise Physiologist    Virtual Visit No    Medication changes reported     No    Fall or balance concerns reported    No    Tobacco Cessation No Change    Warm-up and Cool-down Performed on first and last piece of equipment    Resistance Training Performed Yes    VAD Patient? No    PAD/SET Patient? No      Pain Assessment   Currently in Pain? No/denies    Multiple Pain Sites No                Social History   Tobacco Use  Smoking Status Never  Smokeless Tobacco Never    Goals Met:  Independence with exercise equipment Exercise tolerated well No report of concerns or symptoms today  Goals Unmet:  Not Applicable  Comments: Pt able to follow exercise prescription today without complaint.  Will continue to monitor for progression.    Dr. Bethann Punches is Medical Director for [facility] Cardiac Rehabilitation.  Dr. Vida Rigger is Medical Director for [facility] Pulmonary Rehabilitation.

## 2023-03-07 ENCOUNTER — Encounter: Payer: Medicare Other | Admitting: *Deleted

## 2023-03-07 DIAGNOSIS — Z955 Presence of coronary angioplasty implant and graft: Secondary | ICD-10-CM

## 2023-03-07 DIAGNOSIS — I214 Non-ST elevation (NSTEMI) myocardial infarction: Secondary | ICD-10-CM

## 2023-03-07 NOTE — Progress Notes (Signed)
Daily Session Note  Patient Details  Name: Malayna Test MRN: [id number] Date of Birth: [birth year] Referring Provider:   Flowsheet Row Cardiac Rehab from 01/10/2023 in [facility] Cardiac and Pulmonary Rehab  Referring Provider Massie Maroon MD       Encounter Date: 03/07/2023  Check In:  Session Check In - 03/07/23 0929       Check-In   Supervising physician immediately available to respond to emergencies See telemetry face sheet for immediately available ER MD    Location [facility]-Cardiac & Pulmonary Rehab    Staff Present Lanny Hurst, RN, Franki Monte, BS, ACSM CEP, Exercise Physiologist;Noah Tickle, BS, Exercise Physiologist    Virtual Visit No    Medication changes reported     No    Fall or balance concerns reported    No    Warm-up and Cool-down Performed on first and last piece of equipment    Resistance Training Performed Yes    VAD Patient? No    PAD/SET Patient? No      Pain Assessment   Currently in Pain? No/denies                Social History   Tobacco Use  Smoking Status Never  Smokeless Tobacco Never    Goals Met:  Independence with exercise equipment Exercise tolerated well No report of concerns or symptoms today Strength training completed today  Goals Unmet:  Not Applicable  Comments: Pt able to follow exercise prescription today without complaint.  Will continue to monitor for progression.    Dr. Bethann Punches is Medical Director for [facility] Cardiac Rehabilitation.  Dr. Vida Rigger is Medical Director for [facility] Pulmonary Rehabilitation.

## 2023-03-09 ENCOUNTER — Encounter: Payer: Medicare Other | Admitting: *Deleted

## 2023-03-09 VITALS — Ht 64.9 in | Wt 150.5 lb

## 2023-03-09 DIAGNOSIS — I214 Non-ST elevation (NSTEMI) myocardial infarction: Secondary | ICD-10-CM | POA: Diagnosis not present

## 2023-03-09 DIAGNOSIS — Z955 Presence of coronary angioplasty implant and graft: Secondary | ICD-10-CM

## 2023-03-09 NOTE — Patient Instructions (Signed)
Discharge Patient Instructions  Patient Details  Name: Anne Ross MRN: [id number] Date of Birth: 02-22-1941 Referring Provider:  Marina Ross, Anne E, MD   Number of Visits: [phone]  Reason for Discharge:  Patient reached a stable level of exercise. Patient independent in their exercise. Patient has met program and personal goals.  Diagnosis:  NSTEMI (non-ST elevation myocardial infarction)  Status post coronary artery stent placement  Initial Exercise Prescription:  Initial Exercise Prescription - 01/10/23 1400       Date of Initial Exercise RX and Referring Provider   Date 01/10/23    Referring Provider Anne Ross, Chadd MD      Oxygen   Maintain Oxygen Saturation 88% or higher      Treadmill   MPH 1.2    Grade 0.5    Minutes 15    METs 2      NuStep   Level 1    SPM 80    Minutes 15    METs 2      T5 Nustep   Level 1    SPM 80    Minutes 15    METs 2      Track   Laps 23    Minutes 15    METs 2.25      Prescription Details   Frequency (times per week) 3    Duration Progress to 30 minutes of continuous aerobic without signs/symptoms of physical distress      Intensity   THRR 40-80% of Max Heartrate 89-123    Ratings of Perceived Exertion 11-13    Perceived Dyspnea 0-4      Progression   Progression Continue to progress workloads to maintain intensity without signs/symptoms of physical distress.      Resistance Training   Training Prescription Yes    Weight 2 lb    Reps 10-15             Discharge Exercise Prescription (Final Exercise Prescription Changes):  Exercise Prescription Changes - 03/02/23 1400       Response to Exercise   Blood Pressure (Admit) 138/66    Blood Pressure (Exit) 118/60    Heart Rate (Admit) 65 bpm    Heart Rate (Exercise) 90 bpm    Heart Rate (Exit) 73 bpm    Rating of Perceived Exertion (Exercise) 13    Symptoms none    Duration Continue with 30 min of aerobic exercise without signs/symptoms of physical  distress.    Intensity THRR unchanged      Progression   Progression Continue to progress workloads to maintain intensity without signs/symptoms of physical distress.    Average METs 2.54      Resistance Training   Training Prescription Yes    Weight 3 lb    Reps 10-15      Interval Training   Interval Training No      Treadmill   MPH 2.2    Grade 2    Minutes 15    METs 3.29      NuStep   Level 3    Minutes 15    METs 2.8      T5 Nustep   Level 1    Minutes 15    METs 2.2      Track   Laps 30    Minutes 15    METs 2.63      Home Exercise Plan   Plans to continue exercise at Home (comment)   walking, treadmill, staff videos  Frequency Add 2 additional days to program exercise sessions.    Initial Home Exercises Provided 01/28/23      Oxygen   Maintain Oxygen Saturation 88% or higher             Functional Capacity:  6 Minute Walk     Row Name 01/10/23 1354 03/09/23 0937       6 Minute Walk   Phase Initial Discharge    Distance 915 feet 1200 feet    Distance % Change -- 31 %    Distance Feet Change -- 285 ft    Walk Time 6 minutes 6 minutes    # of Rest Breaks 0 0    MPH 1.73 2.27    METS 1.91 2.27    RPE 13 11    Perceived Dyspnea  2 2    VO2 Peak 6.69 7.96    Symptoms Yes (comment) Yes (comment)    Comments fatigue, R hip pain (chronic) 6/10, SOB SOB due to allergies    Resting HR 55 bpm 65 bpm    Resting BP 126/64 126/58    Resting Oxygen Saturation  98 % 97 %    Exercise Oxygen Saturation  during 6 min walk 96 % 92 %    Max Ex. HR 88 bpm 88 bpm    Max Ex. BP 166/74 158/54    2 Minute Post BP 122/64 --            Nutrition & Weight - Outcomes:  Pre Biometrics - 01/10/23 1410       Pre Biometrics   Height 5' 4.9" (1.648 m)    Weight 147 lb 8 oz (66.9 kg)    Waist Circumference 33 inches    Hip Circumference 39.5 inches    Waist to Hip Ratio 0.84 %    BMI (Calculated) 24.63    Single Leg Stand 1.9 seconds              Post Biometrics - 03/09/23 5284        Post  Biometrics   Height 5' 4.9" (1.648 m)    Weight 150 lb 8 oz (68.3 kg)    Waist Circumference 33 inches    Hip Circumference 38 inches    Waist to Hip Ratio 0.87 %    BMI (Calculated) 25.14    Single Leg Stand 13.2 seconds             Nutrition:  Nutrition Therapy & Goals - 01/20/23 1033       Nutrition Therapy   Diet Heart healthy, low Na    Drug/Food Interactions Statins/Certain Fruits    Protein (specify units) 80g    Fiber 25 grams    Whole Grain Foods 3 servings    Saturated Fats 12 max. grams    Fruits and Vegetables 8 servings/day    Sodium 2 grams      Personal Nutrition Goals   Nutrition Goal ST: review paperwork, switch to whole wheat bread, add nuts/seeds to breakfast and/or swap to higher protein yogurt LT:  meet protein needs, include 20-25g of fiber/day, continue with heart healthy changes    Comments 82 y.o. F admitted to cardiac rehab s/p NSTEMI with stent placement. PMHx inlcudes HTN, HLD, recurrent UTIs, CAD, fibromyalgia, IBS, migraines, osteoarthritis, osteopenia. Reviewed relevant medications: lipitor, vit C, vit D3, vit B12, vit B6, folic acid, tumeric extract, protonix. Anne Ross reports eating many vegetables and salads, limiting sweets - except for piece of chocolate she has  1x/day with a cup of coffee, she limits fried foods and lots of fat with food, she likes to grill and cook food in a pan with no additional fat or bake with some olive oil. Since her heart event, her appetite was dulled and she didn't eat much the first couple of weeks s/p heart event; Anne Ross does report that her appetite is normally smaller regardless. B: yogurt with some cereal such as cheerios L: peanut butter and jelly1/2 or whole sandiwch or 1/2 pimento cheese sandwich with some chips and an apple D: last night: chicken and salad with some beans. She reports limiting potatoes and will instead have rice. She makes pasta salad in the summer.  She enjoys fruit and eats more melons in the summer. She uses salt while cooking, but not afterwards. She chooses lean meat, pork tenderloin, and chicken. She does not snack during the day normally. Drinks: grapefruit juice, coffee with cream, water as well as some crystal lite, decaf tea with splenda. Discussed heart healthy eating.      Intervention Plan   Intervention Prescribe, educate and counsel regarding individualized specific dietary modifications aiming towards targeted core components such as weight, hypertension, lipid management, diabetes, heart failure and other comorbidities.    Expected Outcomes Short Term Goal: Understand basic principles of dietary content, such as calories, fat, sodium, cholesterol and nutrients.;Short Term Goal: A plan has been developed with personal nutrition goals set during dietitian appointment.;Long Term Goal: Adherence to prescribed nutrition plan.

## 2023-03-09 NOTE — Progress Notes (Signed)
Daily Session Note  Patient Details  Name: Anne Ross MRN: [id number] Date of Birth: June 01, 1941 Referring Provider:   Flowsheet Row Cardiac Rehab from 01/10/2023 in [facility] Cardiac and Pulmonary Rehab  Referring Provider Massie Maroon MD       Encounter Date: 03/09/2023  Check In:  Session Check In - 03/09/23 0937       Check-In   Supervising physician immediately available to respond to emergencies See telemetry face sheet for immediately available ER MD    Location [facility]-Cardiac & Pulmonary Rehab    Staff Present Lanny Hurst, RN, ADN;Jessica Juanetta Gosling, MA, RCEP, CCRP, CCET;Noah Tickle, BS, Exercise Physiologist    Virtual Visit No    Medication changes reported     No    Fall or balance concerns reported    No    Warm-up and Cool-down Performed on first and last piece of equipment    Resistance Training Performed Yes    VAD Patient? No    PAD/SET Patient? No      Pain Assessment   Currently in Pain? No/denies                Social History   Tobacco Use  Smoking Status Never  Smokeless Tobacco Never    Goals Met:  Independence with exercise equipment Exercise tolerated well No report of concerns or symptoms today Strength training completed today  Goals Unmet:  Not Applicable  Comments: Pt able to follow exercise prescription today without complaint.  Will continue to monitor for progression.    Dr. Bethann Punches is Medical Director for [facility] Cardiac Rehabilitation.  Dr. Vida Rigger is Medical Director for [facility] Pulmonary Rehabilitation.

## 2023-03-11 ENCOUNTER — Encounter: Payer: Medicare Other | Admitting: *Deleted

## 2023-03-11 DIAGNOSIS — I214 Non-ST elevation (NSTEMI) myocardial infarction: Secondary | ICD-10-CM

## 2023-03-11 DIAGNOSIS — Z955 Presence of coronary angioplasty implant and graft: Secondary | ICD-10-CM

## 2023-03-11 NOTE — Progress Notes (Signed)
Daily Session Note  Patient Details  Name: Anne Ross MRN: [id number] Date of Birth: 08/17/1941 Referring Provider:   Flowsheet Row Cardiac Rehab from 01/10/2023 in [place] Cardiac and Pulmonary Rehab  Referring Provider Massie Maroon MD       Encounter Date: 03/11/2023  Check In:  Session Check In - 03/11/23 0926       Check-In   Supervising physician immediately available to respond to emergencies See telemetry face sheet for immediately available ER MD    Location [facility]-Cardiac & Pulmonary Rehab    Staff Present Elige Ko, Murlean Caller, MA, RCEP, CCRP, CCET;Leslie Castrejon, RN, BSN    Virtual Visit No    Medication changes reported     No    Fall or balance concerns reported    No    Tobacco Cessation No Change    Warm-up and Cool-down Performed on first and last piece of equipment    Resistance Training Performed Yes    VAD Patient? No    PAD/SET Patient? No      Pain Assessment   Currently in Pain? No/denies                Social History   Tobacco Use  Smoking Status Never  Smokeless Tobacco Never    Goals Met:  Independence with exercise equipment Exercise tolerated well No report of concerns or symptoms today Strength training completed today  Goals Unmet:  Not Applicable  Comments: Pt able to follow exercise prescription today without complaint.  Will continue to monitor for progression.   Dr. Bethann Punches is Medical Director for [facility] Cardiac Rehabilitation.  Dr. Vida Rigger is Medical Director for [facility] Pulmonary Rehabilitation.

## 2023-03-14 ENCOUNTER — Encounter: Payer: Medicare Other | Admitting: *Deleted

## 2023-03-16 ENCOUNTER — Encounter: Payer: Self-pay | Admitting: *Deleted

## 2023-03-16 DIAGNOSIS — Z955 Presence of coronary angioplasty implant and graft: Secondary | ICD-10-CM

## 2023-03-16 DIAGNOSIS — I214 Non-ST elevation (NSTEMI) myocardial infarction: Secondary | ICD-10-CM

## 2023-03-16 NOTE — Progress Notes (Signed)
Cardiac Individual Treatment Plan  Patient Details  Name: Anne Ross MRN: [id number] Date of Birth: 06-Nov-1941 Referring Provider:   Flowsheet Row Cardiac Rehab from 01/10/2023 in [facility] Cardiac and Pulmonary Rehab  Referring Provider Massie Maroon MD       Initial Encounter Date:  Flowsheet Row Cardiac Rehab from 01/10/2023 in [facility] Cardiac and Pulmonary Rehab  Date 01/10/23       Visit Diagnosis: NSTEMI (non-ST elevation myocardial infarction)  Status post coronary artery stent placement  Patient's Home Medications on Admission:  Current Outpatient Medications:    ascorbic acid (VITAMIN C) 1000 MG tablet, Take 1,000 mg by mouth daily., Disp: , Rfl:    aspirin EC 81 MG tablet, Take 81 mg by mouth daily., Disp: , Rfl:    atorvastatin (LIPITOR) 80 MG tablet, Take 1 tablet by mouth daily., Disp: , Rfl:    Calcipotriene 0.005 % solution, 1 Application as needed., Disp: , Rfl:    Calcium Carbonate-Vitamin D 500-125 MG-UNIT TABS, Take 1 tablet by mouth daily at 10 pm., Disp: , Rfl:    Calcium Polycarbophil (FIBER) 625 MG TABS, Take 1 tablet by mouth daily., Disp: , Rfl:    cetirizine (ZYRTEC) 10 MG tablet, Take 10 mg by mouth daily., Disp: , Rfl:    chlorhexidine (PERIDEX) 0.12 % solution, Use as directed 15 mLs in the mouth or throat as needed., Disp: , Rfl:    Cholecalciferol (VITAMIN D) 2000 units tablet, Take 2,000 Units by mouth daily., Disp: , Rfl:    clopidogrel (PLAVIX) 75 MG tablet, Take 1 tablet by mouth daily., Disp: , Rfl:    fluticasone (FLONASE) 50 MCG/ACT nasal spray, Place into both nostrils daily., Disp: , Rfl:    folic acid (FOLVITE) 800 MCG tablet, Take 400 mcg by mouth daily., Disp: , Rfl:    heparin 40981 UT/250ML infusion, Inject 1,000 Units/hr into the vein continuous. (Patient not taking: Reported on 01/06/2023), Disp: , Rfl:    metoprolol succinate (TOPROL-XL) 50 MG 24 hr tablet, TAKE 1 TABLET(50 MG) BY MOUTH EVERY DAY, Disp: , Rfl:    nitroGLYCERIN  (NITROSTAT) 0.4 MG SL tablet, Place 1 tablet under the tongue every 5 (five) minutes as needed for chest pain (up to 3 tab)., Disp: , Rfl:    nitroGLYCERIN 0.2 mg/mL infusion, Inject 0-200 mcg/min into the vein continuous. (Patient not taking: Reported on 01/06/2023), Disp: 250 mL, Rfl:    pantoprazole (PROTONIX) 40 MG tablet, Take 40 mg by mouth daily., Disp: , Rfl:    pimecrolimus (ELIDEL) 1 % cream, Apply 1 Application topically as needed., Disp: , Rfl:    pyridoxine (B-6) 100 MG tablet, Take 100 mg by mouth daily., Disp: , Rfl:    ramipril (ALTACE) 10 MG capsule, Take 10 mg by mouth daily., Disp: , Rfl:    trimethoprim (TRIMPEX) 100 MG tablet, Take 1 tablet (100 mg total) by mouth daily. (Patient not taking: Reported on 01/06/2023), Disp: 90 tablet, Rfl: 3   TURMERIC PO, Take 1 capsule by mouth as needed., Disp: , Rfl:    vitamin B-12 (CYANOCOBALAMIN) 250 MCG tablet, Take 250 mcg by mouth daily., Disp: , Rfl:   Past Medical History: Past Medical History:  Diagnosis Date   Basal cell carcinoma    chest    Cancer (HCC)    uterine   History of COVID-19    Hypertension     Tobacco Use: Social History   Tobacco Use  Smoking Status Never  Smokeless Tobacco Never  Labs: Review Flowsheet        No data to display           Exercise Target Goals: Exercise Program Goal: Individual exercise prescription set using results from initial 6 min walk test and THRR while considering  patient's activity barriers and safety.   Exercise Prescription Goal: Initial exercise prescription builds to 30-45 minutes a day of aerobic activity, 2-3 days per week.  Home exercise guidelines will be given to patient during program as part of exercise prescription that the participant will acknowledge.   Education: Aerobic Exercise: - Group verbal and visual presentation on the components of exercise prescription. Introduces F.I.T.T principle from ACSM for exercise prescriptions.  Reviews F.I.T.T.  principles of aerobic exercise including progression. Written material given at graduation. Flowsheet Row Cardiac Rehab from 03/09/2023 in [facility] Cardiac and Pulmonary Rehab  Date 02/23/23  Educator [facility]  Instruction Review Code 1- Verbalizes Understanding       Education: Resistance Exercise: - Group verbal and visual presentation on the components of exercise prescription. Introduces F.I.T.T principle from ACSM for exercise prescriptions  Reviews F.I.T.T. principles of resistance exercise including progression. Written material given at graduation. Flowsheet Row Cardiac Rehab from 03/09/2023 in [facility] Cardiac and Pulmonary Rehab  Date 03/02/23  Educator KW  Instruction Review Code 1- [employer] Understanding        Education: Exercise & Equipment Safety: - Individual verbal instruction and demonstration of equipment use and safety with use of the equipment.   Education: Exercise Physiology & General Exercise Guidelines: - Group verbal and written instruction with models to review the exercise physiology of the cardiovascular system and associated critical values. Provides general exercise guidelines with specific guidelines to those with heart or lung disease.  Flowsheet Row Cardiac Rehab from 03/09/2023 in [facility] Cardiac and Pulmonary Rehab  Date 02/16/23  Educator [facility]  Instruction Review Code 1- Verbalizes Understanding       Education: Flexibility, Balance, Mind/Body Relaxation: - Group verbal and visual presentation with interactive activity on the components of exercise prescription. Introduces F.I.T.T principle from ACSM for exercise prescriptions. Reviews F.I.T.T. principles of flexibility and balance exercise training including progression. Also discusses the mind body connection.  Reviews various relaxation techniques to help reduce and manage stress (i.e. Deep breathing, progressive muscle relaxation, and visualization). Balance handout provided to take home. Written material given  at graduation. Flowsheet Row Cardiac Rehab from 03/09/2023 in [facility] Cardiac and Pulmonary Rehab  Date 03/02/23  Educator KW  Instruction Review Code 1- Verbalizes Understanding       Activity Barriers & Risk Stratification:  Activity Barriers & Cardiac Risk Stratification - 01/10/23 1405       Activity Barriers & Cardiac Risk Stratification   Activity Barriers Other (comment);Shortness of Breath;Joint Problems;Fibromyalgia;Arthritis;Deconditioning;Muscular Weakness;Balance Concerns    Cardiac Risk Stratification Moderate             6 Minute Walk:  6 Minute Walk     Row Name 01/10/23 1354 03/09/23 0937       6 Minute Walk   Phase Initial Discharge    Distance 915 feet 1200 feet    Distance % Change -- 31 %    Distance Feet Change -- 285 ft    Walk Time 6 minutes 6 minutes    # of Rest Breaks 0 0    MPH 1.73 2.27    METS 1.91 2.27    RPE 13 11    Perceived Dyspnea  2 2    VO2  Peak 6.69 7.96    Symptoms Yes (comment) Yes (comment)    Comments fatigue, R hip pain (chronic) 6/10, SOB SOB due to allergies    Resting HR 55 bpm 65 bpm    Resting BP 126/64 126/58    Resting Oxygen Saturation  98 % 97 %    Exercise Oxygen Saturation  during 6 min walk 96 % 92 %    Max Ex. HR 88 bpm 88 bpm    Max Ex. BP 166/74 158/54    2 Minute Post BP 122/64 --             Oxygen Initial Assessment:   Oxygen Re-Evaluation:   Oxygen Discharge (Final Oxygen Re-Evaluation):   Initial Exercise Prescription:  Initial Exercise Prescription - 01/10/23 1400       Date of Initial Exercise RX and Referring Provider   Date 01/10/23    Referring Provider Massie Maroon MD      Oxygen   Maintain Oxygen Saturation 88% or higher      Treadmill   MPH 1.2    Grade 0.5    Minutes 15    METs 2      NuStep   Level 1    SPM 80    Minutes 15    METs 2      T5 Nustep   Level 1    SPM 80    Minutes 15    METs 2      Track   Laps 23    Minutes 15    METs 2.25       Prescription Details   Frequency (times per week) 3    Duration Progress to 30 minutes of continuous aerobic without signs/symptoms of physical distress      Intensity   THRR 40-80% of Max Heartrate 89-123    Ratings of Perceived Exertion 11-13    Perceived Dyspnea 0-4      Progression   Progression Continue to progress workloads to maintain intensity without signs/symptoms of physical distress.      Resistance Training   Training Prescription Yes    Weight 2 lb    Reps 10-15             Perform Capillary Blood Glucose checks as needed.  Exercise Prescription Changes:   Exercise Prescription Changes     Row Name 01/10/23 1400 01/17/23 1300 01/28/23 0900 01/31/23 1400 02/14/23 1100     Response to Exercise   Blood Pressure (Admit) 126/64 142/78 -- 114/62 124/66   Blood Pressure (Exercise) 166/74 138/62 -- 158/64 --   Blood Pressure (Exit) 122/64 122/62 -- 122/60 108/60   Heart Rate (Admit) 55 bpm 63 bpm -- 64 bpm 61 bpm   Heart Rate (Exercise) 88 bpm 99 bpm -- 111 bpm 89 bpm   Heart Rate (Exit) 59 bpm 54 bpm -- 70 bpm 63 bpm   Oxygen Saturation (Admit) 98 % -- -- -- --   Oxygen Saturation (Exercise) 96 % -- -- -- --   Rating of Perceived Exertion (Exercise) 13 14 -- 14 14   Perceived Dyspnea (Exercise) 2 -- -- -- --   Symptoms fatigue, R hip (chronic 6/10), SOB none -- none none   Comments walk test results 2nd full day of exercise -- -- --   Duration -- Progress to 30 minutes of  aerobic without signs/symptoms of physical distress -- Continue with 30 min of aerobic exercise without signs/symptoms of physical distress. Continue with 30 min of  aerobic exercise without signs/symptoms of physical distress.   Intensity -- THRR unchanged -- THRR unchanged THRR unchanged     Progression   Progression -- Continue to progress workloads to maintain intensity without signs/symptoms of physical distress. -- Continue to progress workloads to maintain intensity without  signs/symptoms of physical distress. Continue to progress workloads to maintain intensity without signs/symptoms of physical distress.   Average METs -- 2.38 -- 2.32 2.5     Resistance Training   Training Prescription -- Yes -- Yes Yes   Weight -- 2 lb -- 2 lb 2 lb   Reps -- 10-15 -- 10-15 10-15     Interval Training   Interval Training -- No -- No No     Treadmill   MPH -- 2.5 -- 2.5 2   Grade -- 0 -- 0 1   Minutes -- 15 -- 15 15   METs -- 2.91 -- 2.91 2.81     NuStep   Level -- 3 -- 1 5   Minutes -- 15 -- 15 15   METs -- -- -- -- 2.8     T5 Nustep   Level -- -- -- 1 1   Minutes -- -- -- 15 15   METs -- -- -- 2.1 2.3     Biostep-RELP   Level -- -- -- 1 --   Minutes -- -- -- 15 --   METs -- -- -- 2 --     Home Exercise Plan   Plans to continue exercise at -- -- Home (comment)  walking, treadmill, staff videos Home (comment)  walking, treadmill, staff videos Home (comment)  walking, treadmill, staff videos   Frequency -- -- Add 2 additional days to program exercise sessions. Add 2 additional days to program exercise sessions. Add 2 additional days to program exercise sessions.   Initial Home Exercises Provided -- -- 01/28/23 01/28/23 01/28/23     Oxygen   Maintain Oxygen Saturation -- 88% or higher -- 88% or higher 88% or higher    Row Name 03/02/23 1400 03/14/23 1400           Response to Exercise   Blood Pressure (Admit) 138/66 138/64      Blood Pressure (Exit) 118/60 122/64      Heart Rate (Admit) 65 bpm 65 bpm      Heart Rate (Exercise) 90 bpm 90 bpm      Heart Rate (Exit) 73 bpm 77 bpm      Rating of Perceived Exertion (Exercise) 13 13      Symptoms none none      Duration Continue with 30 min of aerobic exercise without signs/symptoms of physical distress. Continue with 30 min of aerobic exercise without signs/symptoms of physical distress.      Intensity THRR unchanged THRR unchanged        Progression   Progression Continue to progress workloads to  maintain intensity without signs/symptoms of physical distress. Continue to progress workloads to maintain intensity without signs/symptoms of physical distress.      Average METs 2.54 2.93        Resistance Training   Training Prescription Yes Yes      Weight 3 lb 3 lb      Reps 10-15 10-15        Interval Training   Interval Training No No        Treadmill   MPH 2.2 2.3      Grade 2 2.5  Minutes 15 15      METs 3.29 3.55        NuStep   Level 3 4      Minutes 15 15      METs 2.8 3.1        T5 Nustep   Level 1 2      Minutes 15 15      METs 2.2 2.2        Track   Laps 30 --      Minutes 15 --      METs 2.63 --        Home Exercise Plan   Plans to continue exercise at Home (comment)  walking, treadmill, staff videos Home (comment)  walking, treadmill, staff videos      Frequency Add 2 additional days to program exercise sessions. Add 2 additional days to program exercise sessions.      Initial Home Exercises Provided 01/28/23 01/28/23        Oxygen   Maintain Oxygen Saturation 88% or higher 88% or higher               Exercise Comments:   Exercise Comments     Row Name 01/12/23 1005           Exercise Comments First full day of exercise!  Patient was oriented to gym and equipment including functions, settings, policies, and procedures.  Patient's individual exercise prescription and treatment plan were reviewed.  All starting workloads were established based on the results of the 6 minute walk test done at initial orientation visit.  The plan for exercise progression was also introduced and progression will be customized based on patient's performance and goals.                Exercise Goals and Review:   Exercise Goals     Row Name 01/10/23 1409             Exercise Goals   Increase Physical Activity Yes       Intervention Provide advice, education, support and counseling about physical activity/exercise needs.;Develop an individualized  exercise prescription for aerobic and resistive training based on initial evaluation findings, risk stratification, comorbidities and participant's personal goals.       Expected Outcomes Short Term: Attend rehab on a regular basis to increase amount of physical activity.;Long Term: Add in home exercise to make exercise part of routine and to increase amount of physical activity.;Long Term: Exercising regularly at least 3-5 days a week.       Increase Strength and Stamina Yes       Intervention Provide advice, education, support and counseling about physical activity/exercise needs.;Develop an individualized exercise prescription for aerobic and resistive training based on initial evaluation findings, risk stratification, comorbidities and participant's personal goals.       Expected Outcomes Short Term: Increase workloads from initial exercise prescription for resistance, speed, and METs.;Short Term: Perform resistance training exercises routinely during rehab and add in resistance training at home;Long Term: Improve cardiorespiratory fitness, muscular endurance and strength as measured by increased METs and functional capacity ( )       Able to understand and use rate of perceived exertion (RPE) scale Yes       Intervention Provide education and explanation on how to use RPE scale       Expected Outcomes Short Term: Able to use RPE daily in rehab to express subjective intensity level;Long Term:  Able to use RPE to guide intensity level  when exercising independently       Able to understand and use Dyspnea scale Yes       Intervention Provide education and explanation on how to use Dyspnea scale       Expected Outcomes Short Term: Able to use Dyspnea scale daily in rehab to express subjective sense of shortness of breath during exertion;Long Term: Able to use Dyspnea scale to guide intensity level when exercising independently       Knowledge and understanding of Target Heart Rate Range (THRR) Yes        Intervention Provide education and explanation of THRR including how the numbers were predicted and where they are located for reference       Expected Outcomes Short Term: Able to state/look up THRR;Short Term: Able to use daily as guideline for intensity in rehab;Long Term: Able to use THRR to govern intensity when exercising independently       Able to check pulse independently Yes       Intervention Provide education and demonstration on how to check pulse in carotid and radial arteries.;Review the importance of being able to check your own pulse for safety during independent exercise       Expected Outcomes Short Term: Able to explain why pulse checking is important during independent exercise;Long Term: Able to check pulse independently and accurately       Understanding of Exercise Prescription Yes       Intervention Provide education, explanation, and written materials on patient's individual exercise prescription       Expected Outcomes Short Term: Able to explain program exercise prescription;Long Term: Able to explain home exercise prescription to exercise independently                Exercise Goals Re-Evaluation :  Exercise Goals Re-Evaluation     Row Name 01/12/23 1005 01/17/23 1354 01/28/23 0934 01/31/23 1453 02/14/23 1143     Exercise Goal Re-Evaluation   Exercise Goals Review Able to understand and use rate of perceived exertion (RPE) scale;Able to understand and use Dyspnea scale;Knowledge and understanding of Target Heart Rate Range (THRR);Understanding of Exercise Prescription Increase Physical Activity;Increase Strength and Stamina;Understanding of Exercise Prescription Increase Physical Activity;Increase Strength and Stamina;Understanding of Exercise Prescription;Able to understand and use rate of perceived exertion (RPE) scale;Able to understand and use Dyspnea scale;Knowledge and understanding of Target Heart Rate Range (THRR);Able to check pulse independently Increase  Physical Activity;Increase Strength and Stamina;Understanding of Exercise Prescription Increase Physical Activity;Increase Strength and Stamina;Understanding of Exercise Prescription   Comments Reviewed RPE scale, THR and program prescription with pt today.  Pt voiced understanding and was given a copy of goals to take home. Nura did well for her first full session of rehab. She was able to complete her full initial exercise prescription and was even able to increase her treadmill speed to 2.5 mph and increase to level 3 on the T4 Nustep. We will continue to monitor as she progresses. Anne Ross is off to a good start in rehab.  She is already starting to feel better overall and improve her stamina.  Reviewed home exercise with pt today.  Pt plans to walk and use treadmill at home for exercise.  She is also planning to purchase weights to use.  We also talked about the staff vidoes on YouTube.  Reviewed THR, pulse, RPE, sign and symptoms, pulse oximetery and when to call 911 or MD.  Also discussed weather considerations and indoor options.  Pt voiced understanding. Anne Ross is doing  well in the program. She has continued to walk the treadmill at a speed of 2.5 mph with no incline, although her speed did fluctuate down to 1.9 mph during a few sessions. She also has continued to work at level 1 on all seated machines, but could benefit from increasing her workloads. We will continue to monitor her progress in the program. Anne Ross continues to do well in rehab. She increased to level 5 on the T4 Nustep. She did add a 1% incline to her treadmill, however, she decreased her speed. Staff will encourage her to increase that back up. She cuold benefit from increasing to level 2 on the T5 Nustep. Will continue to monitor.   Expected Outcomes Short: Use RPE daily to regulate intensity. Long: Follow program prescription in THR. Short: Continue to follow initial exercise prescription Long: Increase overall MET level and stamina Short:  start to add in more exercise at home Long: Conitnue to exercise independent Short: Keep treadmill workload consistent, progressively increase workloads on seated machines. Long: Continue to improve strength and stamina. Short: Increase to level 2 on the T5 Nustep Long: Continue to increase overall MET level and stamina    Row Name 02/23/23 0940 03/02/23 1425 03/14/23 1438         Exercise Goal Re-Evaluation   Exercise Goals Review Increase Physical Activity;Increase Strength and Stamina;Understanding of Exercise Prescription Increase Physical Activity;Increase Strength and Stamina;Understanding of Exercise Prescription Increase Physical Activity;Increase Strength and Stamina;Understanding of Exercise Prescription     Comments Anne Ross reports using treadmill at home for exercise - 20 minutes 2x/week - encouraged to warm up and cool down like she is at rehab. She has 3lb and 5lb weights. Anne Ross is doing well in rehab. She recently increased her overall average MET level to 2.54 METs. She also increased from 2 lb to 3 lb hand weights for resistance training. She increased her treadmill workload as well to a speed of 2.2 mph and an incline of 2%. We will continue to monitor her progress in the program. Anne Ross continues to do well in rehab. she completed her post and improved by 31%!! She also increased her workload on the treadmill to a 2.3/2.5% incline. She also increased to level 2 on the T5 Nustep. We will continue to monitor until she graduates, which will be in the next week or so.     Expected Outcomes ST: continue to exercise at home: at least 2x week when not in rehab LT: Conitnue to exercise independently Short: Continue to progressively increase treadmill workload. Long: Continue to improve strength and stamina. Short: Graduate Long: Continue to increase overall MET level and stamina              Discharge Exercise Prescription (Final Exercise Prescription Changes):  Exercise Prescription  Changes - 03/14/23 1400       Response to Exercise   Blood Pressure (Admit) 138/64    Blood Pressure (Exit) 122/64    Heart Rate (Admit) 65 bpm    Heart Rate (Exercise) 90 bpm    Heart Rate (Exit) 77 bpm    Rating of Perceived Exertion (Exercise) 13    Symptoms none    Duration Continue with 30 min of aerobic exercise without signs/symptoms of physical distress.    Intensity THRR unchanged      Progression   Progression Continue to progress workloads to maintain intensity without signs/symptoms of physical distress.    Average METs 2.93      Resistance Training  Training Prescription Yes    Weight 3 lb    Reps 10-15      Interval Training   Interval Training No      Treadmill   MPH 2.3    Grade 2.5    Minutes 15    METs 3.55      NuStep   Level 4    Minutes 15    METs 3.1      T5 Nustep   Level 2    Minutes 15    METs 2.2      Home Exercise Plan   Plans to continue exercise at Home (comment)   walking, treadmill, staff videos   Frequency Add 2 additional days to program exercise sessions.    Initial Home Exercises Provided 01/28/23      Oxygen   Maintain Oxygen Saturation 88% or higher             Nutrition:  Target Goals: Understanding of nutrition guidelines, daily intake of sodium 1500mg , cholesterol 200mg , calories 30% from fat and 7% or less from saturated fats, daily to have 5 or more servings of fruits and vegetables.  Education: All About Nutrition: -Group instruction provided by verbal, written material, interactive activities, discussions, models, and posters to present general guidelines for heart healthy nutrition including fat, fiber, MyPlate, the role of sodium in heart healthy nutrition, utilization of the nutrition label, and utilization of this knowledge for meal planning. Follow up email sent as well. Written material given at graduation. Flowsheet Row Cardiac Rehab from 03/09/2023 in [facility] Cardiac and Pulmonary Rehab  Date 03/09/23   Educator [facility]  Instruction Review Code 1- Verbalizes Understanding       Biometrics:  Pre Biometrics - 01/10/23 1410       Pre Biometrics   Height 5' 4.9" (1.648 m)    Weight 147 lb 8 oz (66.9 kg)    Waist Circumference 33 inches    Hip Circumference 39.5 inches    Waist to Hip Ratio 0.84 %    BMI (Calculated) 24.63    Single Leg Stand 1.9 seconds             Post Biometrics - 03/09/23 1610        Post  Biometrics   Height 5' 4.9" (1.648 m)    Weight 150 lb 8 oz (68.3 kg)    Waist Circumference 33 inches    Hip Circumference 38 inches    Waist to Hip Ratio 0.87 %    BMI (Calculated) 25.14    Single Leg Stand 13.2 seconds             Nutrition Therapy Plan and Nutrition Goals:  Nutrition Therapy & Goals - 01/20/23 1033       Nutrition Therapy   Diet Heart healthy, low Na    Drug/Food Interactions Statins/Certain Fruits    Protein (specify units) 80g    Fiber 25 grams    Whole Grain Foods 3 servings    Saturated Fats 12 max. grams    Fruits and Vegetables 8 servings/day    Sodium 2 grams      Personal Nutrition Goals   Nutrition Goal ST: review paperwork, switch to whole wheat bread, add nuts/seeds to breakfast and/or swap to higher protein yogurt LT:  meet protein needs, include 20-25g of fiber/day, continue with heart healthy changes    Comments 82 y.o. F admitted to cardiac rehab s/p NSTEMI with stent placement. PMHx inlcudes HTN, HLD, recurrent UTIs, CAD, fibromyalgia,  IBS, migraines, osteoarthritis, osteopenia. Reviewed relevant medications: lipitor, vit C, vit D3, vit B12, vit B6, folic acid, tumeric extract, protonix. Anne Ross reports eating many vegetables and salads, limiting sweets - except for piece of chocolate she has 1x/day with a cup of coffee, she limits fried foods and lots of fat with food, she likes to grill and cook food in a pan with no additional fat or bake with some olive oil. Since her heart event, her appetite was dulled and she didn't eat  much the first couple of weeks s/p heart event; Anne Ross does report that her appetite is normally smaller regardless. B: yogurt with some cereal such as cheerios L: peanut butter and jelly1/2 or whole sandiwch or 1/2 pimento cheese sandwich with some chips and an apple D: last night: chicken and salad with some beans. She reports limiting potatoes and will instead have rice. She makes pasta salad in the summer. She enjoys fruit and eats more melons in the summer. She uses salt while cooking, but not afterwards. She chooses lean meat, pork tenderloin, and chicken. She does not snack during the day normally. Drinks: grapefruit juice, coffee with cream, water as well as some crystal lite, decaf tea with splenda. Discussed heart healthy eating.      Intervention Plan   Intervention Prescribe, educate and counsel regarding individualized specific dietary modifications aiming towards targeted core components such as weight, hypertension, lipid management, diabetes, heart failure and other comorbidities.    Expected Outcomes Short Term Goal: Understand basic principles of dietary content, such as calories, fat, sodium, cholesterol and nutrients.;Short Term Goal: A plan has been developed with personal nutrition goals set during dietitian appointment.;Long Term Goal: Adherence to prescribed nutrition plan.             Nutrition Assessments:  MEDIFICTS Score Key: ?70 Need to make dietary changes  40-70 Heart Healthy Diet ? 40 Therapeutic Level Cholesterol Diet  Flowsheet Row Cardiac Rehab from 03/11/2023 in [facility] Card[facility]  Picture Your Plate Total Score on Admission 68  Picture Your Plate Total Score on Discharge 72      Picture Your Plate Scores: <16 Unhealthy dietary pattern with much room for improvement. 41-50 Dietary pattern unlikely to meet recommendations for good health and room for improvement. 51-60 More healthful dietary pattern, with some room for improvement.  >60  Healthy dietary pattern, although there may be some specific behaviors that could be improved.    Nutrition Goals Re-Evaluation:  Nutrition Goals Re-Evaluation     Row Name 02/23/23 [phone]             Goals   Nutrition Goal ST: continue with heart healthy changes made LT:  meet protein needs, include 20-25g of fiber/day, continue with heart healthy changes       Comment Anne Ross reports using whole wheat bread now, but still doesn't have much bread in general she makes vegetable soup. She reports eating sweet potatoes and rice, eating lots of fruit and vegetables, using pork tenderloin and chicken breast, and using olive oil. She reports still chooses heart healthy foods most of the time. Her appetite still fluctuates. Suggested incorporating lentils or beans to rice, she reports liking beans with rice as well as peas. She puts almonds and walnuts in her yogurt now.       Expected Outcome ST: continue with heart healthy changes made LT: meet protein needs, include 20-25g of fiber/day, continue with heart healthy changes  Nutrition Goals Discharge (Final Nutrition Goals Re-Evaluation):  Nutrition Goals Re-Evaluation - 02/23/23 0942       Goals   Nutrition Goal ST: continue with heart healthy changes made LT:  meet protein needs, include 20-25g of fiber/day, continue with heart healthy changes    Comment Anne Ross reports using whole wheat bread now, but still doesn't have much bread in general she makes vegetable soup. She reports eating sweet potatoes and rice, eating lots of fruit and vegetables, using pork tenderloin and chicken breast, and using olive oil. She reports still chooses heart healthy foods most of the time. Her appetite still fluctuates. Suggested incorporating lentils or beans to rice, she reports liking beans with rice as well as peas. She puts almonds and walnuts in her yogurt now.    Expected Outcome ST: continue with heart healthy changes made LT: meet protein  needs, include 20-25g of fiber/day, continue with heart healthy changes             Psychosocial: Target Goals: Acknowledge presence or absence of significant depression and/or stress, maximize coping skills, provide positive support system. Participant is able to verbalize types and ability to use techniques and skills needed for reducing stress and depression.   Education: Stress, Anxiety, and Depression - Group verbal and visual presentation to define topics covered.  Reviews how body is impacted by stress, anxiety, and depression.  Also discusses healthy ways to reduce stress and to treat/manage anxiety and depression.  Written material given at graduation.   Education: Sleep Hygiene -Provides group verbal and written instruction about how sleep can affect your health.  Define sleep hygiene, discuss sleep cycles and impact of sleep habits. Review good sleep hygiene tips.    Initial Review & Psychosocial Screening:  Initial Psych Review & Screening - 01/06/23 1552       Initial Review   Current issues with None Identified      Family Dynamics   Good Support System? Yes   husband  and  daughter     Barriers   Psychosocial barriers to participate in program There are no identifiable barriers or psychosocial needs.      Screening Interventions   Interventions Encouraged to exercise;To provide support and resources with identified psychosocial needs;Provide feedback about the scores to participant    Expected Outcomes Short Term goal: Utilizing psychosocial counselor, staff and physician to assist with identification of specific Stressors or current issues interfering with healing process. Setting desired goal for each stressor or current issue identified.;Long Term Goal: Stressors or current issues are controlled or eliminated.;Short Term goal: Identification and review with participant of any Quality of Life or Depression concerns found by scoring the questionnaire.;Long Term goal:  The participant improves quality of Life and PHQ9 Scores as seen by post scores and/or verbalization of changes             Quality of Life Scores:   Quality of Life - 03/11/23 0946       Quality of Life   Select Quality of Life      Quality of Life Scores   Health/Function Pre 22.4 %    Health/Function Post 28.8 %    Health/Function % Change 28.57 %    Socioeconomic Pre 30 %    Socioeconomic Post 30 %    Socioeconomic % Change  0 %    Psych/Spiritual Pre 26.57 %    Psych/Spiritual Post 30 %    Psych/Spiritual % Change 12.91 %    Family Pre  30 %    Family Post 30 %    Family % Change 0 %    GLOBAL Pre 25.94 %    GLOBAL Post 29.47 %    GLOBAL % Change 13.61 %            Scores of 19 and below usually indicate a poorer quality of life in these areas.  A difference of  2-3 points is a clinically meaningful difference.  A difference of 2-3 points in the total score of the Quality of Life Index has been associated with significant improvement in overall quality of life, self-image, physical symptoms, and general health in studies assessing change in quality of life.  PHQ-9: Review Flowsheet       03/11/2023 02/04/2023 01/10/2023  Depression screen PHQ 2/9  Decreased Interest 0 0 0  Down, Depressed, Hopeless 0 0 0  PHQ - 2 Score 0 0 0  Altered sleeping 0 0 0  Tired, decreased energy 1 1 3   Change in appetite 0 0 2  Feeling bad or failure about yourself  0 0 0  Trouble concentrating 0 0 0  Moving slowly or fidgety/restless 0 0 2  Suicidal thoughts 0 0 0  PHQ-9 Score 1 1 7   Difficult doing work/chores Not difficult at all Not difficult at all Somewhat difficult   Interpretation of Total Score  Total Score Depression Severity:  1-4 = Minimal depression, 5-9 = Mild depression, 10-14 = Moderate depression, 15-19 = Moderately severe depression, 20-27 = Severe depression   Psychosocial Evaluation and Intervention:  Psychosocial Evaluation - 01/06/23 1602        Psychosocial Evaluation & Interventions   Interventions Encouraged to exercise with the program and follow exercise prescription    Comments Anne Ross has no barriers to attending the program. She lives with her husband and her duaghter is working at home form their home at this time. Her support is her husband and her daughter.   She is ready to get started with the program.    Expected Outcomes STG  Anne Ross is able to attend all scheduled sessions.  She is able to progress with her exercise . LTG Anne Ross continues her exercise progression after discharge    Continue Psychosocial Services  Follow up required by staff             Psychosocial Re-Evaluation:  Psychosocial Re-Evaluation     Row Name 01/28/23 [phone] 02/04/23 0954 02/23/23 0927         Psychosocial Re-Evaluation   Current issues with Current Stress Concerns Current Stress Concerns None Identified     Comments Anne Ross is off to a good start in rehab.  She does not feel like she has any stressors in life.  She does her best to not let anything get to her too much.  She sees things that she can't do but now is forming her own opinion about how things should be done.  She is sleeping well.  She has noted occassional night sweats since her event. Reviewed patient health questionnaire (PHQ-9) with patient for follow up. Previously, patients score indicated signs/symptoms of depression.  Reviewed to see if patient is improving symptom wise while in program.  Score improved and patient states that it is because they have doing more and feeling better. Anne Ross reports no stress concerns at this time and reports sleeping well. She relies on her husband and daughter for support. She hasn't been outside recently due to pollen, but enjoys the outdoors and  reading. She also loves seeing her grandchildren - she sees them frequently.     Expected Outcomes Short: Continue to build stamina to get back to self Long; Conitnue to stay positive Short: Continue  to attend rehab to build stamina Long: Conitnue to stay positive ST: Continue to attend rehab to build stamina and for mental health boost LT:  Conitnue to stay positive     Interventions Encouraged to attend Cardiac Rehabilitation for the exercise Encouraged to attend Cardiac Rehabilitation for the exercise Encouraged to attend Cardiac Rehabilitation for the exercise     Continue Psychosocial Services  Follow up required by staff -- Follow up required by staff              Psychosocial Discharge (Final Psychosocial Re-Evaluation):  Psychosocial Re-Evaluation - 02/23/23 1610       Psychosocial Re-Evaluation   Current issues with None Identified    Comments Anne Ross reports no stress concerns at this time and reports sleeping well. She relies on her husband and daughter for support. She hasn't been outside recently due to pollen, but enjoys the outdoors and reading. She also loves seeing her grandchildren - she sees them frequently.    Expected Outcomes ST: Continue to attend rehab to build stamina and for mental health boost LT:  Conitnue to stay positive    Interventions Encouraged to attend Cardiac Rehabilitation for the exercise    Continue Psychosocial Services  Follow up required by staff             Vocational Rehabilitation: Provide vocational rehab assistance to qualifying candidates.   Vocational Rehab Evaluation & Intervention:  Vocational Rehab - 01/06/23 1557       Initial Vocational Rehab Evaluation & Intervention   Assessment shows need for Vocational Rehabilitation No      Vocational Rehab Re-Evaulation   Comments retired             Education: Education Goals: Education classes will be provided on a variety of topics geared toward better understanding of heart health and risk factor modification. Participant will state understanding/return demonstration of topics presented as noted by education test scores.  Learning Barriers/Preferences:  Learning  Barriers/Preferences - 01/06/23 1556       Learning Barriers/Preferences   Learning Barriers None    Learning Preferences None             General Cardiac Education Topics:  AED/CPR: - Group verbal and written instruction with the use of models to demonstrate the basic use of the AED with the basic ABC's of resuscitation.   Anatomy and Cardiac Procedures: - Group verbal and visual presentation and models provide information about basic cardiac anatomy and function. Reviews the testing methods done to diagnose heart disease and the outcomes of the test results. Describes the treatment choices: Medical Management, Angioplasty, or Coronary Bypass Surgery for treating various heart conditions including Myocardial Infarction, Angina, Valve Disease, and Cardiac Arrhythmias.  Written material given at graduation.   Medication Safety: - Group verbal and visual instruction to review commonly prescribed medications for heart and lung disease. Reviews the medication, class of the drug, and side effects. Includes the steps to properly store meds and maintain the prescription regimen.  Written material given at graduation. Flowsheet Row Cardiac Rehab from 03/09/2023 in [facility] Cardiac and Pulmonary Rehab  Date 01/19/23  Educator MS  Instruction Review Code 1- Verbalizes Understanding       Intimacy: - Group verbal instruction through game format to discuss how heart  and lung disease can affect sexual intimacy. Written material given at graduation.. Flowsheet Row Cardiac Rehab from 03/09/2023 in [facility] Cardiac and Pulmonary Rehab  Date 02/23/23  Educator [facility]  Instruction Review Code 1- Verbalizes Understanding       Know Your Numbers and Heart Failure: - Group verbal and visual instruction to discuss disease risk factors for cardiac and pulmonary disease and treatment options.  Reviews associated critical values for Overweight/Obesity, Hypertension, Cholesterol, and Diabetes.  Discusses basics  of heart failure: signs/symptoms and treatments.  Introduces Heart Failure Zone chart for action plan for heart failure.  Written material given at graduation. Flowsheet Row Cardiac Rehab from 03/09/2023 in [facility] Cardiac and Pulmonary Rehab  Date 01/26/23  Educator [facility]  Instruction Review Code 1- Verbalizes Understanding       Infection Prevention: - Provides verbal and written material to individual with discussion of infection control including proper hand washing and proper equipment cleaning during exercise session.   Falls Prevention: - Provides verbal and written material to individual with discussion of falls prevention and safety. Flowsheet Row Cardiac Rehab from 03/09/2023 in [facility] Cardiac and Pulmonary Rehab  Date 01/06/23  Educator SB  Instruction Review Code 1- Verbalizes Understanding       Other: -Provides group and verbal instruction on various topics (see comments) Flowsheet Row Cardiac Rehab from 03/09/2023 in [facility] Cardiac and Pulmonary Rehab  Date 01/12/23  Educator SB  Instruction Review Code 1- Verbalizes Understanding       Knowledge Questionnaire Score:  Knowledge Questionnaire Score - 03/11/23 0948       Knowledge Questionnaire Score   Pre Score 19/26    Post Score 22/26             Core Components/Risk Factors/Patient Goals at Admission:  Personal Goals and Risk Factors at Admission - 01/10/23 1411       Core Components/Risk Factors/Patient Goals on Admission    Weight Management Yes;Obesity;Weight Loss    Intervention Weight Management: Develop a combined nutrition and exercise program designed to reach desired caloric intake, while maintaining appropriate intake of nutrient and fiber, sodium and fats, and appropriate energy expenditure required for the weight goal.;Weight Management: Provide education and appropriate resources to help participant work on and attain dietary goals.;Weight Management/Obesity: Establish reasonable short term and  long term weight goals.;Obesity: Provide education and appropriate resources to help participant work on and attain dietary goals.    Admit Weight 147 lb 8 oz (66.9 kg)    Goal Weight: Short Term 143 lb (64.9 kg)    Goal Weight: Long Term 140 lb (63.5 kg)    Expected Outcomes Short Term: Continue to assess and modify interventions until short term weight is achieved;Long Term: Adherence to nutrition and physical activity/exercise program aimed toward attainment of established weight goal;Weight Loss: Understanding of general recommendations for a balanced deficit meal plan, which promotes 1-2 lb weight loss per week and includes a negative energy balance of [phone] kcal/d;Understanding recommendations for meals to include 15-35% energy as protein, 25-35% energy from fat, 35-60% energy from carbohydrates, less than 200mg  of dietary cholesterol, 20-35 gm of total fiber daily;Understanding of distribution of calorie intake throughout the day with the consumption of 4-5 meals/snacks    Diabetes Yes    Intervention Provide education about signs/symptoms and action to take for hypo/hyperglycemia.;Provide education about proper nutrition, including hydration, and aerobic/resistive exercise prescription along with prescribed medications to achieve blood glucose in normal ranges: Fasting glucose 65-99 mg/dL    Expected Outcomes  Short Term: Participant verbalizes understanding of the signs/symptoms and immediate care of hyper/hypoglycemia, proper foot care and importance of medication, aerobic/resistive exercise and nutrition plan for blood glucose control.;Long Term: Attainment of HbA1C < 7%.    Hypertension Yes    Intervention Provide education on lifestyle modifcations including regular physical activity/exercise, weight management, moderate sodium restriction and increased consumption of fresh fruit, vegetables, and low fat dairy, alcohol moderation, and smoking cessation.;Monitor prescription use compliance.     Expected Outcomes Short Term: Continued assessment and intervention until BP is < 140/68mm HG in hypertensive participants. < 130/29mm HG in hypertensive participants with diabetes, heart failure or chronic kidney disease.;Long Term: Maintenance of blood pressure at goal levels.    Lipids Yes    Intervention Provide education and support for participant on nutrition & aerobic/resistive exercise along with prescribed medications to achieve LDL 70mg , HDL >40mg .    Expected Outcomes Short Term: Participant states understanding of desired cholesterol values and is compliant with medications prescribed. Participant is following exercise prescription and nutrition guidelines.;Long Term: Cholesterol controlled with medications as prescribed, with individualized exercise RX and with personalized nutrition plan. Value goals: LDL < , HDL > 40 mg.             Education:Diabetes - Individual verbal and written instruction to review signs/symptoms of diabetes, desired ranges of glucose level fasting, after meals and with exercise. Acknowledge that pre and post exercise glucose checks will be done for 3 sessions at entry of program.   Core Components/Risk Factors/Patient Goals Review:   Goals and Risk Factor Review     Row Name 01/28/23 0949 02/23/23 0930           Core Components/Risk Factors/Patient Goals Review   Personal Goals Review Weight Management/Obesity;Hypertension;Lipids;Diabetes Weight Management/Obesity;Hypertension;Lipids;Diabetes      Review Anne Ross is doing well in rehab.  Her weight is staying steady for the most part.  She was up today as they went out to eat yesterday for a birthday celebration. Her blood sugars are doing well and she checks them daily at home.  Her blood pressures have been runing pretty good in class as well as at home.  Her family has been helping her keep a close eye on both of them.  She is feeling better since her last tweak in medications. Anne Ross is doing  well in rehab, but reports last week after rehab she was lightheaded, tingling in her hands, she was pale, and felt tightness in her chest - she reports going to the doctor; she started feeling better and they left before seeing anyone. Encouraged her to let her doctor know this happened and encouraged her to let us know if she feels this way at rehab right away. She reports that her BG and BP have been doing well. Her weight has been stable.      Expected Outcomes Short: Continue to keep eye on sugars and pressures Long: Conitnue to monitor risk factors. ST: Speak with MD about symptoms week of 3/17 and move appointment for follow up LT: Conitnue to monitor risk factors.               Core Components/Risk Factors/Patient Goals at Discharge (Final Review):   Goals and Risk Factor Review - 02/23/23 0930       Core Components/Risk Factors/Patient Goals Review   Personal Goals Review Weight Management/Obesity;Hypertension;Lipids;Diabetes    Review Anne Ross is doing well in rehab, but reports last week after rehab she was lightheaded, tingling in her hands,  she was pale, and felt tightness in her chest - she reports going to the doctor; she started feeling better and they left before seeing anyone. Encouraged her to let her doctor know this happened and encouraged her to let us know if she feels this way at rehab right away. She reports that her BG and BP have been doing well. Her weight has been stable.    Expected Outcomes ST: Speak with MD about symptoms week of 3/17 and move appointment for follow up LT: Conitnue to monitor risk factors.             ITP Comments:  ITP Comments     Row Name 01/06/23 1601 01/10/23 1352 01/12/23 1005 01/19/23 1202 01/20/23 1033   ITP Comments Virtual orientation call completed today. shehas an appointment on Date: 01/10/2023  for EP eval and gym Orientation.  Documentation of diagnosis can be found in [facility] Date: 12/17/2022 . Completed and gym orientation.  Initial ITP created and sent for review to Dr. Bethann Punches, Medical Director. First full day of exercise!  Patient was oriented to gym and equipment including functions, settings, policies, and procedures.  Patient's individual exercise prescription and treatment plan were reviewed.  All starting workloads were established based on the results of the 6 minute walk test done at initial orientation visit.  The plan for exercise progression was also introduced and progression will be customized based on patient's performance and goals. 30 day review completed. ITP sent to Dr. Bethann Punches, Medical Director of Cardiac Rehab. Continue with ITP unless changes are made by physician.  Recently started on 01/10/23. Completed initial RD consultation    Row Name 02/16/23 0754 03/16/23 0809         ITP Comments 30 Day review completed. Medical Director ITP review done, changes made as directed, and signed approval by Medical Director. 30 day review completed. ITP sent to Dr. Bethann Punches, Medical Director of Cardiac Rehab. Continue with ITP unless changes are made by physician.               Comments: 30 day review

## 2023-03-21 ENCOUNTER — Encounter: Payer: Medicare Other | Admitting: *Deleted

## 2023-03-21 DIAGNOSIS — I214 Non-ST elevation (NSTEMI) myocardial infarction: Secondary | ICD-10-CM

## 2023-03-21 DIAGNOSIS — Z955 Presence of coronary angioplasty implant and graft: Secondary | ICD-10-CM

## 2023-03-21 NOTE — Progress Notes (Signed)
Daily Session Note  Patient Details  Name: Alaiza Yau MRN: [id number] Date of Birth: 10/29/1941 Referring Provider:   Flowsheet Row Cardiac Rehab from 01/10/2023 in [facility] Cardiac and Pulmonary Rehab  Referring Provider Massie Maroon MD       Encounter Date: 03/21/2023  Check In:  Session Check In - 03/21/23 0923       Check-In   Supervising physician immediately available to respond to emergencies See telemetry face sheet for immediately available ER MD    Location [facility]-Cardiac & Pulmonary Rehab    Staff Present Lanny Hurst, RN, Franki Monte, BS, ACSM CEP, Exercise Physiologist;Noah Tickle, BS, Exercise Physiologist    Virtual Visit No    Medication changes reported     No    Fall or balance concerns reported    No    Warm-up and Cool-down Performed on first and last piece of equipment    Resistance Training Performed Yes    VAD Patient? No    PAD/SET Patient? No      Pain Assessment   Currently in Pain? No/denies                Social History   Tobacco Use  Smoking Status Never  Smokeless Tobacco Never    Goals Met:  Independence with exercise equipment Exercise tolerated well No report of concerns or symptoms today Strength training completed today  Goals Unmet:  Not Applicable  Comments: Pt able to follow exercise prescription today without complaint.  Will continue to monitor for progression.    Dr. Bethann Punches is Medical Director for [facility] Cardiac Rehabilitation.  Dr. Vida Rigger is Medical Director for [facility] Pulmonary Rehabilitation.

## 2023-03-23 ENCOUNTER — Encounter: Payer: Medicare Other | Admitting: *Deleted

## 2023-03-23 DIAGNOSIS — Z955 Presence of coronary angioplasty implant and graft: Secondary | ICD-10-CM

## 2023-03-23 DIAGNOSIS — I214 Non-ST elevation (NSTEMI) myocardial infarction: Secondary | ICD-10-CM | POA: Diagnosis not present

## 2023-03-23 NOTE — Progress Notes (Signed)
Daily Session Note  Patient Details  Name: Anne Ross MRN: [id number] Date of Birth: 01/09/1941 Referring Provider:   Flowsheet Row Cardiac Rehab from 01/10/2023 in [facility] Cardiac and Pulmonary Rehab  Referring Provider Massie Maroon MD       Encounter Date: 03/23/2023  Check In:  Session Check In - 03/23/23 0923       Check-In   Supervising physician immediately available to respond to emergencies See telemetry face sheet for immediately available ER MD    Location [facility]-Cardiac & Pulmonary Rehab    Staff Present Lanny Hurst, RN, ADN;Joseph Reino Kent, RCP,RRT,BSRT;Noah Tickle, BS, Exercise Physiologist    Virtual Visit No    Medication changes reported     No    Fall or balance concerns reported    No    Warm-up and Cool-down Performed on first and last piece of equipment    Resistance Training Performed Yes    VAD Patient? No    PAD/SET Patient? No      Pain Assessment   Currently in Pain? No/denies                Social History   Tobacco Use  Smoking Status Never  Smokeless Tobacco Never    Goals Met:  Independence with exercise equipment Exercise tolerated well No report of concerns or symptoms today Strength training completed today  Goals Unmet:  Not Applicable  Comments: Pt able to follow exercise prescription today without complaint.  Will continue to monitor for progression.    Dr. Bethann Punches is Medical Director for [facility] Cardiac Rehabilitation.  Dr. Vida Rigger is Medical Director for [facility] Pulmonary Rehabilitation.

## 2023-03-25 ENCOUNTER — Encounter: Payer: Medicare Other | Admitting: *Deleted

## 2023-03-25 DIAGNOSIS — I214 Non-ST elevation (NSTEMI) myocardial infarction: Secondary | ICD-10-CM | POA: Diagnosis not present

## 2023-03-25 DIAGNOSIS — Z955 Presence of coronary angioplasty implant and graft: Secondary | ICD-10-CM

## 2023-03-25 NOTE — Progress Notes (Signed)
Daily Session Note  Patient Details  Name: Anne Ross MRN: [id number] Date of Birth: Oct 11, 1941 Referring Provider:   Flowsheet Row Cardiac Rehab from 01/10/2023 in [facility] Cardiac and Pulmonary Rehab  Referring Provider Massie Maroon MD       Encounter Date: 03/25/2023  Check In:  Session Check In - 03/25/23 0939       Check-In   Supervising physician immediately available to respond to emergencies See telemetry face sheet for immediately available ER MD    Location [facility]-Cardiac & Pulmonary Rehab    Staff Present Bess Kinds RN, BSN;Susanne Bice, RN, BSN, CCRP;Other   Johnathan Hausen, BS & [place], [place]   Virtual Visit No    Medication changes reported     No    Fall or balance concerns reported    No    Tobacco Cessation No Change    Warm-up and Cool-down Performed on first and last piece of equipment    Resistance Training Performed Yes    VAD Patient? No    PAD/SET Patient? No      Pain Assessment   Currently in Pain? No/denies                Social History   Tobacco Use  Smoking Status Never  Smokeless Tobacco Never    Goals Met:  Independence with exercise equipment Exercise tolerated well No report of concerns or symptoms today Strength training completed today  Goals Unmet:  Not Applicable  Comments: Pt able to follow exercise prescription today without complaint.  Will continue to monitor for progression.    Dr. Bethann Punches is Medical Director for [facility] Cardiac Rehabilitation.  Dr. Vida Rigger is Medical Director for [facility] Pulmonary Rehabilitation.

## 2023-03-28 ENCOUNTER — Encounter: Payer: Medicare Other | Admitting: *Deleted

## 2023-03-28 DIAGNOSIS — Z955 Presence of coronary angioplasty implant and graft: Secondary | ICD-10-CM

## 2023-03-28 DIAGNOSIS — I214 Non-ST elevation (NSTEMI) myocardial infarction: Secondary | ICD-10-CM | POA: Diagnosis not present

## 2023-03-28 NOTE — Progress Notes (Signed)
Discharge Summary: Anne Ross (DOB: 11/24/1941)  Anne Ross graduated today from  rehab with 36 sessions completed.  Details of the patient's exercise prescription and what She needs to do in order to continue the prescription and progress were discussed with patient.  Patient was given a copy of prescription and goals.  Patient verbalized understanding.  [name] plans to continue to exercise by walking, treadmill, and staff videos.   6 Minute Walk     Row Name 01/10/23 1354 03/09/23 0937       6 Minute Walk   Phase Initial Discharge    Distance 915 feet 1200 feet    Distance % Change -- 31 %    Distance Feet Change -- 285 ft    Walk Time 6 minutes 6 minutes    # of Rest Breaks 0 0    MPH 1.73 2.27    METS 1.91 2.27    RPE 13 11    Perceived Dyspnea  2 2    VO2 Peak 6.69 7.96    Symptoms Yes (comment) Yes (comment)    Comments fatigue, R hip pain (chronic) 6/10, SOB SOB due to allergies    Resting HR 55 bpm 65 bpm    Resting BP 126/64 126/58    Resting Oxygen Saturation  98 % 97 %    Exercise Oxygen Saturation  during 6 min walk 96 % 92 %    Max Ex. HR 88 bpm 88 bpm    Max Ex. BP 166/74 158/54    2 Minute Post BP 122/64 --           '

## 2023-03-28 NOTE — Progress Notes (Signed)
Cardiac Individual Treatment Plan  Patient Details  Name: Anne Ross MRN: [id number] Date of Birth: 1981/09/27 Referring Provider:   Flowsheet Row Cardiac Rehab from 01/10/2023 in [facility] Cardiac and Pulmonary Rehab  Referring Provider Gregary Cromer MD       Initial Encounter Date:  Flowsheet Row Cardiac Rehab from 01/10/2023 in [facility] Cardiac and Pulmonary Rehab  Date 01/10/23       Visit Diagnosis: NSTEMI (non-ST elevation myocardial infarction) [facility])  Status post coronary artery stent placement  Patient's Home Medications on Admission:  Current Outpatient Medications:    ascorbic acid (VITAMIN C) 1000 MG tablet, Take 1,000 mg by mouth daily., Disp: , Rfl:    aspirin EC 81 MG tablet, Take 81 mg by mouth daily., Disp: , Rfl:    atorvastatin (LIPITOR) 80 MG tablet, Take 1 tablet by mouth daily., Disp: , Rfl:    Calcipotriene 0.005 % solution, 1 Application as needed., Disp: , Rfl:    Calcium Carbonate-Vitamin D 500-125 MG-UNIT TABS, Take 1 tablet by mouth daily at 10 pm., Disp: , Rfl:    Calcium Polycarbophil (FIBER) 625 MG TABS, Take 1 tablet by mouth daily., Disp: , Rfl:    cetirizine (ZYRTEC) 10 MG tablet, Take 10 mg by mouth daily., Disp: , Rfl:    chlorhexidine (PERIDEX) 0.12 % solution, Use as directed 15 mLs in the mouth or throat as needed., Disp: , Rfl:    Cholecalciferol (VITAMIN D) 2000 units tablet, Take 2,000 Units by mouth daily., Disp: , Rfl:    clopidogrel (PLAVIX) 75 MG tablet, Take 1 tablet by mouth daily., Disp: , Rfl:    fluticasone (FLONASE) 50 MCG/ACT nasal spray, Place into both nostrils daily., Disp: , Rfl:    folic acid (FOLVITE) [id number] MCG tablet, Take 400 mcg by mouth daily., Disp: , Rfl:    heparin 25000 UT/250ML infusion, Inject 1,000 Units/hr into the vein continuous. (Patient not taking: Reported on 01/06/2023), Disp: , Rfl:    metoprolol succinate (TOPROL-XL) 50 MG 24 hr tablet, TAKE 1 TABLET(50 MG) BY MOUTH EVERY DAY, Disp: , Rfl:    nitroGLYCERIN  (NITROSTAT) 0.4 MG SL tablet, Place 1 tablet under the tongue every 5 (five) minutes as needed for chest pain (up to 3 tab)., Disp: , Rfl:    nitroGLYCERIN 0.2 mg/mL infusion, Inject 0-200 mcg/min into the vein continuous. (Patient not taking: Reported on 01/06/2023), Disp: 250 mL, Rfl:    pantoprazole (PROTONIX) 40 MG tablet, Take 40 mg by mouth daily., Disp: , Rfl:    pimecrolimus (ELIDEL) 1 % cream, Apply 1 Application topically as needed., Disp: , Rfl:    pyridoxine (B-6) 100 MG tablet, Take 100 mg by mouth daily., Disp: , Rfl:    ramipril (ALTACE) 10 MG capsule, Take 10 mg by mouth daily., Disp: , Rfl:    trimethoprim (TRIMPEX) 100 MG tablet, Take 1 tablet (100 mg total) by mouth daily. (Patient not taking: Reported on 01/06/2023), Disp: 90 tablet, Rfl: 3   TURMERIC PO, Take 1 capsule by mouth as needed., Disp: , Rfl:    vitamin B-12 (CYANOCOBALAMIN) 250 MCG tablet, Take 250 mcg by mouth daily., Disp: , Rfl:   Past Medical History: Past Medical History:  Diagnosis Date   Basal cell carcinoma    chest    Cancer ([place])    uterine   History of COVID-19    Hypertension     Tobacco Use: Social History   Tobacco Use  Smoking Status Never  Smokeless Tobacco Never  Labs: Review Flowsheet        No data to display           Exercise Target Goals: Exercise Program Goal: Individual exercise prescription set using results from initial 6 min walk test and THRR while considering  patient's activity barriers and safety.   Exercise Prescription Goal: Initial exercise prescription builds to 30-45 minutes a day of aerobic activity, 2-3 days per week.  Home exercise guidelines will be given to patient during program as part of exercise prescription that the participant will acknowledge.   Education: Aerobic Exercise: - Group verbal and visual presentation on the components of exercise prescription. Introduces F.I.T.T principle from ACSM for exercise prescriptions.  Reviews F.I.T.T.  principles of aerobic exercise including progression. Written material given at graduation. Flowsheet Row Cardiac Rehab from 03/09/2023 in [facility] Cardiac and Pulmonary Rehab  Date 02/23/23  Educator [facility]  Instruction Review Code 1- Verbalizes Understanding       Education: Resistance Exercise: - Group verbal and visual presentation on the components of exercise prescription. Introduces F.I.T.T principle from ACSM for exercise prescriptions  Reviews F.I.T.T. principles of resistance exercise including progression. Written material given at graduation. Flowsheet Row Cardiac Rehab from 03/09/2023 in [facility] Cardiac and Pulmonary Rehab  Date 03/02/23  Educator KW  Instruction Review Code 1- [employer] Understanding        Education: Exercise & Equipment Safety: - Individual verbal instruction and demonstration of equipment use and safety with use of the equipment.   Education: Exercise Physiology & General Exercise Guidelines: - Group verbal and written instruction with models to review the exercise physiology of the cardiovascular system and associated critical values. Provides general exercise guidelines with specific guidelines to those with heart or lung disease.  Flowsheet Row Cardiac Rehab from 03/09/2023 in [facility] Cardiac and Pulmonary Rehab  Date 02/16/23  Educator [facility]  Instruction Review Code 1- Verbalizes Understanding       Education: Flexibility, Balance, Mind/Body Relaxation: - Group verbal and visual presentation with interactive activity on the components of exercise prescription. Introduces F.I.T.T principle from ACSM for exercise prescriptions. Reviews F.I.T.T. principles of flexibility and balance exercise training including progression. Also discusses the mind body connection.  Reviews various relaxation techniques to help reduce and manage stress (i.e. Deep breathing, progressive muscle relaxation, and visualization). Balance handout provided to take home. Written material given  at graduation. Flowsheet Row Cardiac Rehab from 03/09/2023 in [facility] Cardiac and Pulmonary Rehab  Date 03/02/23  Educator KW  Instruction Review Code 1- Verbalizes Understanding       Activity Barriers & Risk Stratification:  Activity Barriers & Cardiac Risk Stratification - 01/10/23 1405       Activity Barriers & Cardiac Risk Stratification   Activity Barriers Other (comment);Shortness of Breath;Joint Problems;Fibromyalgia;Arthritis;Deconditioning;Muscular Weakness;Balance Concerns    Cardiac Risk Stratification Moderate             6 Minute Walk:  6 Minute Walk     Row Name 01/10/23 1354 03/09/23 0937       6 Minute Walk   Phase Initial Discharge    Distance 915 feet 1200 feet    Distance % Change -- 31 %    Distance Feet Change -- 285 ft    Walk Time 6 minutes 6 minutes    # of Rest Breaks 0 0    MPH 1.73 2.27    METS 1.91 2.27    RPE 13 11    Perceived Dyspnea  2 2    VO2  Peak 6.69 7.96    Symptoms Yes (comment) Yes (comment)    Comments fatigue, R hip pain (chronic) 6/10, SOB SOB due to allergies    Resting HR 55 bpm 65 bpm    Resting BP 126/64 126/58    Resting Oxygen Saturation  98 % 97 %    Exercise Oxygen Saturation  during 6 min walk 96 % 92 %    Max Ex. HR 88 bpm 88 bpm    Max Ex. BP 166/74 158/54    2 Minute Post BP 122/64 --             Oxygen Initial Assessment:   Oxygen Re-Evaluation:   Oxygen Discharge (Final Oxygen Re-Evaluation):   Initial Exercise Prescription:  Initial Exercise Prescription - 01/10/23 1400       Date of Initial Exercise RX and Referring Provider   Date 01/10/23    Referring Provider Massie Maroon MD      Oxygen   Maintain Oxygen Saturation 88% or higher      Treadmill   MPH 1.2    Grade 0.5    Minutes 15    METs 2      NuStep   Level 1    SPM 80    Minutes 15    METs 2      T5 Nustep   Level 1    SPM 80    Minutes 15    METs 2      Track   Laps 23    Minutes 15    METs 2.25       Prescription Details   Frequency (times per week) 3    Duration Progress to 30 minutes of continuous aerobic without signs/symptoms of physical distress      Intensity   THRR 40-80% of Max Heartrate 89-123    Ratings of Perceived Exertion 11-13    Perceived Dyspnea 0-4      Progression   Progression Continue to progress workloads to maintain intensity without signs/symptoms of physical distress.      Resistance Training   Training Prescription Yes    Weight 2 lb    Reps 10-15             Perform Capillary Blood Glucose checks as needed.  Exercise Prescription Changes:   Exercise Prescription Changes     Row Name 01/10/23 1400 01/17/23 1300 01/28/23 0900 01/31/23 1400 02/14/23 1100     Response to Exercise   Blood Pressure (Admit) 126/64 142/78 -- 114/62 124/66   Blood Pressure (Exercise) 166/74 138/62 -- 158/64 --   Blood Pressure (Exit) 122/64 122/62 -- 122/60 108/60   Heart Rate (Admit) 55 bpm 63 bpm -- 64 bpm 61 bpm   Heart Rate (Exercise) 88 bpm 99 bpm -- 111 bpm 89 bpm   Heart Rate (Exit) 59 bpm 54 bpm -- 70 bpm 63 bpm   Oxygen Saturation (Admit) 98 % -- -- -- --   Oxygen Saturation (Exercise) 96 % -- -- -- --   Rating of Perceived Exertion (Exercise) 13 14 -- 14 14   Perceived Dyspnea (Exercise) 2 -- -- -- --   Symptoms fatigue, R hip (chronic 6/10), SOB none -- none none   Comments walk test results 2nd full day of exercise -- -- --   Duration -- Progress to 30 minutes of  aerobic without signs/symptoms of physical distress -- Continue with 30 min of aerobic exercise without signs/symptoms of physical distress. Continue with 30 min of  aerobic exercise without signs/symptoms of physical distress.   Intensity -- THRR unchanged -- THRR unchanged THRR unchanged     Progression   Progression -- Continue to progress workloads to maintain intensity without signs/symptoms of physical distress. -- Continue to progress workloads to maintain intensity without  signs/symptoms of physical distress. Continue to progress workloads to maintain intensity without signs/symptoms of physical distress.   Average METs -- 2.38 -- 2.32 2.5     Resistance Training   Training Prescription -- Yes -- Yes Yes   Weight -- 2 lb -- 2 lb 2 lb   Reps -- 10-15 -- 10-15 10-15     Interval Training   Interval Training -- No -- No No     Treadmill   MPH -- 2.5 -- 2.5 2   Grade -- 0 -- 0 1   Minutes -- 15 -- 15 15   METs -- 2.91 -- 2.91 2.81     NuStep   Level -- 3 -- 1 5   Minutes -- 15 -- 15 15   METs -- -- -- -- 2.8     T5 Nustep   Level -- -- -- 1 1   Minutes -- -- -- 15 15   METs -- -- -- 2.1 2.3     Biostep-RELP   Level -- -- -- 1 --   Minutes -- -- -- 15 --   METs -- -- -- 2 --     Home Exercise Plan   Plans to continue exercise at -- -- Home (comment)  walking, treadmill, staff videos Home (comment)  walking, treadmill, staff videos Home (comment)  walking, treadmill, staff videos   Frequency -- -- Add 2 additional days to program exercise sessions. Add 2 additional days to program exercise sessions. Add 2 additional days to program exercise sessions.   Initial Home Exercises Provided -- -- 01/28/23 01/28/23 01/28/23     Oxygen   Maintain Oxygen Saturation -- 88% or higher -- 88% or higher 88% or higher    Row Name 03/02/23 1400 03/14/23 1400           Response to Exercise   Blood Pressure (Admit) 138/66 138/64      Blood Pressure (Exit) 118/60 122/64      Heart Rate (Admit) 65 bpm 65 bpm      Heart Rate (Exercise) 90 bpm 90 bpm      Heart Rate (Exit) 73 bpm 77 bpm      Rating of Perceived Exertion (Exercise) 13 13      Symptoms none none      Duration Continue with 30 min of aerobic exercise without signs/symptoms of physical distress. Continue with 30 min of aerobic exercise without signs/symptoms of physical distress.      Intensity THRR unchanged THRR unchanged        Progression   Progression Continue to progress workloads to  maintain intensity without signs/symptoms of physical distress. Continue to progress workloads to maintain intensity without signs/symptoms of physical distress.      Average METs 2.54 2.93        Resistance Training   Training Prescription Yes Yes      Weight 3 lb 3 lb      Reps 10-15 10-15        Interval Training   Interval Training No No        Treadmill   MPH 2.2 2.3      Grade 2 2.5  Minutes 15 15      METs 3.29 3.55        NuStep   Level 3 4      Minutes 15 15      METs 2.8 3.1        T5 Nustep   Level 1 2      Minutes 15 15      METs 2.2 2.2        Track   Laps 30 --      Minutes 15 --      METs 2.63 --        Home Exercise Plan   Plans to continue exercise at Home (comment)  walking, treadmill, staff videos Home (comment)  walking, treadmill, staff videos      Frequency Add 2 additional days to program exercise sessions. Add 2 additional days to program exercise sessions.      Initial Home Exercises Provided 01/28/23 01/28/23        Oxygen   Maintain Oxygen Saturation 88% or higher 88% or higher               Exercise Comments:   Exercise Comments     Row Name 01/12/23 1005           Exercise Comments First full day of exercise!  Patient was oriented to gym and equipment including functions, settings, policies, and procedures.  Patient's individual exercise prescription and treatment plan were reviewed.  All starting workloads were established based on the results of the 6 minute walk test done at initial orientation visit.  The plan for exercise progression was also introduced and progression will be customized based on patient's performance and goals.                Exercise Goals and Review:   Exercise Goals     Row Name 01/10/23 1409             Exercise Goals   Increase Physical Activity Yes       Intervention Provide advice, education, support and counseling about physical activity/exercise needs.;Develop an individualized  exercise prescription for aerobic and resistive training based on initial evaluation findings, risk stratification, comorbidities and participant's personal goals.       Expected Outcomes Short Term: Attend rehab on a regular basis to increase amount of physical activity.;Long Term: Add in home exercise to make exercise part of routine and to increase amount of physical activity.;Long Term: Exercising regularly at least 3-5 days a week.       Increase Strength and Stamina Yes       Intervention Provide advice, education, support and counseling about physical activity/exercise needs.;Develop an individualized exercise prescription for aerobic and resistive training based on initial evaluation findings, risk stratification, comorbidities and participant's personal goals.       Expected Outcomes Short Term: Increase workloads from initial exercise prescription for resistance, speed, and METs.;Short Term: Perform resistance training exercises routinely during rehab and add in resistance training at home;Long Term: Improve cardiorespiratory fitness, muscular endurance and strength as measured by increased METs and functional capacity ( )       Able to understand and use rate of perceived exertion (RPE) scale Yes       Intervention Provide education and explanation on how to use RPE scale       Expected Outcomes Short Term: Able to use RPE daily in rehab to express subjective intensity level;Long Term:  Able to use RPE to guide intensity level  when exercising independently       Able to understand and use Dyspnea scale Yes       Intervention Provide education and explanation on how to use Dyspnea scale       Expected Outcomes Short Term: Able to use Dyspnea scale daily in rehab to express subjective sense of shortness of breath during exertion;Long Term: Able to use Dyspnea scale to guide intensity level when exercising independently       Knowledge and understanding of Target Heart Rate Range (THRR) Yes        Intervention Provide education and explanation of THRR including how the numbers were predicted and where they are located for reference       Expected Outcomes Short Term: Able to state/look up THRR;Short Term: Able to use daily as guideline for intensity in rehab;Long Term: Able to use THRR to govern intensity when exercising independently       Able to check pulse independently Yes       Intervention Provide education and demonstration on how to check pulse in carotid and radial arteries.;Review the importance of being able to check your own pulse for safety during independent exercise       Expected Outcomes Short Term: Able to explain why pulse checking is important during independent exercise;Long Term: Able to check pulse independently and accurately       Understanding of Exercise Prescription Yes       Intervention Provide education, explanation, and written materials on patient's individual exercise prescription       Expected Outcomes Short Term: Able to explain program exercise prescription;Long Term: Able to explain home exercise prescription to exercise independently                Exercise Goals Re-Evaluation :  Exercise Goals Re-Evaluation     Row Name 01/12/23 1005 01/17/23 1354 01/28/23 0934 01/31/23 1453 02/14/23 1143     Exercise Goal Re-Evaluation   Exercise Goals Review Able to understand and use rate of perceived exertion (RPE) scale;Able to understand and use Dyspnea scale;Knowledge and understanding of Target Heart Rate Range (THRR);Understanding of Exercise Prescription Increase Physical Activity;Increase Strength and Stamina;Understanding of Exercise Prescription Increase Physical Activity;Increase Strength and Stamina;Understanding of Exercise Prescription;Able to understand and use rate of perceived exertion (RPE) scale;Able to understand and use Dyspnea scale;Knowledge and understanding of Target Heart Rate Range (THRR);Able to check pulse independently Increase  Physical Activity;Increase Strength and Stamina;Understanding of Exercise Prescription Increase Physical Activity;Increase Strength and Stamina;Understanding of Exercise Prescription   Comments Reviewed RPE scale, THR and program prescription with pt today.  Pt voiced understanding and was given a copy of goals to take home. Tifanny did well for her first full session of rehab. She was able to complete her full initial exercise prescription and was even able to increase her treadmill speed to 2.5 mph and increase to level 3 on the T4 Nustep. We will continue to monitor as she progresses. [name] is off to a good start in rehab.  She is already starting to feel better overall and improve her stamina.  Reviewed home exercise with pt today.  Pt plans to walk and use treadmill at home for exercise.  She is also planning to purchase weights to use.  We also talked about the staff vidoes on YouTube.  Reviewed THR, pulse, RPE, sign and symptoms, pulse oximetery and when to call 911 or MD.  Also discussed weather considerations and indoor options.  Pt voiced understanding. [name] is doing  well in the program. She has continued to walk the treadmill at a speed of 2.5 mph with no incline, although her speed did fluctuate down to 1.9 mph during a few sessions. She also has continued to work at level 1 on all seated machines, but could benefit from increasing her workloads. We will continue to monitor her progress in the program. [name] continues to do well in rehab. She increased to level 5 on the T4 Nustep. She did add a 1% incline to her treadmill, however, she decreased her speed. Staff will encourage her to increase that back up. She cuold benefit from increasing to level 2 on the T5 Nustep. Will continue to monitor.   Expected Outcomes Short: Use RPE daily to regulate intensity. Long: Follow program prescription in THR. Short: Continue to follow initial exercise prescription Long: Increase overall MET level and stamina Short:  start to add in more exercise at home Long: Conitnue to exercise independent Short: Keep treadmill workload consistent, progressively increase workloads on seated machines. Long: Continue to improve strength and stamina. Short: Increase to level 2 on the T5 Nustep Long: Continue to increase overall MET level and stamina    Row Name 02/23/23 0940 03/02/23 1425 03/14/23 1438         Exercise Goal Re-Evaluation   Exercise Goals Review Increase Physical Activity;Increase Strength and Stamina;Understanding of Exercise Prescription Increase Physical Activity;Increase Strength and Stamina;Understanding of Exercise Prescription Increase Physical Activity;Increase Strength and Stamina;Understanding of Exercise Prescription     Comments [name] reports using treadmill at home for exercise - 20 minutes 2x/week - encouraged to warm up and cool down like she is at rehab. She has 3lb and 5lb weights. [name] is doing well in rehab. She recently increased her overall average MET level to 2.54 METs. She also increased from 2 lb to 3 lb hand weights for resistance training. She increased her treadmill workload as well to a speed of 2.2 mph and an incline of 2%. We will continue to monitor her progress in the program. [name] continues to do well in rehab. she completed her post and improved by 31%!! She also increased her workload on the treadmill to a 2.3/2.5% incline. She also increased to level 2 on the T5 Nustep. We will continue to monitor until she graduates, which will be in the next week or so.     Expected Outcomes ST: continue to exercise at home: at least 2x week when not in rehab LT: Conitnue to exercise independently Short: Continue to progressively increase treadmill workload. Long: Continue to improve strength and stamina. Short: Graduate Long: Continue to increase overall MET level and stamina              Discharge Exercise Prescription (Final Exercise Prescription Changes):  Exercise Prescription  Changes - 03/14/23 1400       Response to Exercise   Blood Pressure (Admit) 138/64    Blood Pressure (Exit) 122/64    Heart Rate (Admit) 65 bpm    Heart Rate (Exercise) 90 bpm    Heart Rate (Exit) 77 bpm    Rating of Perceived Exertion (Exercise) 13    Symptoms none    Duration Continue with 30 min of aerobic exercise without signs/symptoms of physical distress.    Intensity THRR unchanged      Progression   Progression Continue to progress workloads to maintain intensity without signs/symptoms of physical distress.    Average METs 2.93      Resistance Training  Training Prescription Yes    Weight 3 lb    Reps 10-15      Interval Training   Interval Training No      Treadmill   MPH 2.3    Grade 2.5    Minutes 15    METs 3.55      NuStep   Level 4    Minutes 15    METs 3.1      T5 Nustep   Level 2    Minutes 15    METs 2.2      Home Exercise Plan   Plans to continue exercise at Home (comment)   walking, treadmill, staff videos   Frequency Add 2 additional days to program exercise sessions.    Initial Home Exercises Provided 01/28/23      Oxygen   Maintain Oxygen Saturation 88% or higher             Nutrition:  Target Goals: Understanding of nutrition guidelines, daily intake of sodium 1500mg , cholesterol 200mg , calories 30% from fat and 7% or less from saturated fats, daily to have 5 or more servings of fruits and vegetables.  Education: All About Nutrition: -Group instruction provided by verbal, written material, interactive activities, discussions, models, and posters to present general guidelines for heart healthy nutrition including fat, fiber, MyPlate, the role of sodium in heart healthy nutrition, utilization of the nutrition label, and utilization of this knowledge for meal planning. Follow up email sent as well. Written material given at graduation. Flowsheet Row Cardiac Rehab from 03/09/2023 in [facility] Cardiac and Pulmonary Rehab  Date 03/09/23   Educator [facility]  Instruction Review Code 1- Verbalizes Understanding       Biometrics:  Pre Biometrics - 01/10/23 1410       Pre Biometrics   Height 5' 4.9" (1.648 m)    Weight 147 lb 8 oz (66.9 kg)    Waist Circumference 33 inches    Hip Circumference 39.5 inches    Waist to Hip Ratio 0.84 %    BMI (Calculated) 24.63    Single Leg Stand 1.9 seconds             Post Biometrics - 03/09/23 1610        Post  Biometrics   Height 5' 4.9" (1.648 m)    Weight 150 lb 8 oz (68.3 kg)    Waist Circumference 33 inches    Hip Circumference 38 inches    Waist to Hip Ratio 0.87 %    BMI (Calculated) 25.14    Single Leg Stand 13.2 seconds             Nutrition Therapy Plan and Nutrition Goals:  Nutrition Therapy & Goals - 01/20/23 1033       Nutrition Therapy   Diet Heart healthy, low Na    Drug/Food Interactions Statins/Certain Fruits    Protein (specify units) 80g    Fiber 25 grams    Whole Grain Foods 3 servings    Saturated Fats 12 max. grams    Fruits and Vegetables 8 servings/day    Sodium 2 grams      Personal Nutrition Goals   Nutrition Goal ST: review paperwork, switch to whole wheat bread, add nuts/seeds to breakfast and/or swap to higher protein yogurt LT:  meet protein needs, include 20-25g of fiber/day, continue with heart healthy changes    Comments 82 y.o. F admitted to cardiac rehab s/p NSTEMI with stent placement. PMHx inlcudes HTN, HLD, recurrent UTIs, CAD, fibromyalgia,  IBS, migraines, osteoarthritis, osteopenia. Reviewed relevant medications: lipitor, vit C, vit D3, vit B12, vit B6, folic acid, tumeric extract, protonix. [name] reports eating many vegetables and salads, limiting sweets - except for piece of chocolate she has 1x/day with a cup of coffee, she limits fried foods and lots of fat with food, she likes to grill and cook food in a pan with no additional fat or bake with some olive oil. Since her heart event, her appetite was dulled and she didn't eat  much the first couple of weeks s/p heart event; [name] does report that her appetite is normally smaller regardless. B: yogurt with some cereal such as cheerios L: peanut butter and jelly1/2 or whole sandiwch or 1/2 pimento cheese sandwich with some chips and an apple D: last night: chicken and salad with some beans. She reports limiting potatoes and will instead have rice. She makes pasta salad in the summer. She enjoys fruit and eats more melons in the summer. She uses salt while cooking, but not afterwards. She chooses lean meat, pork tenderloin, and chicken. She does not snack during the day normally. Drinks: grapefruit juice, coffee with cream, water as well as some crystal lite, decaf tea with splenda. Discussed heart healthy eating.      Intervention Plan   Intervention Prescribe, educate and counsel regarding individualized specific dietary modifications aiming towards targeted core components such as weight, hypertension, lipid management, diabetes, heart failure and other comorbidities.    Expected Outcomes Short Term Goal: Understand basic principles of dietary content, such as calories, fat, sodium, cholesterol and nutrients.;Short Term Goal: A plan has been developed with personal nutrition goals set during dietitian appointment.;Long Term Goal: Adherence to prescribed nutrition plan.             Nutrition Assessments:  MEDIFICTS Score Key: ?70 Need to make dietary changes  40-70 Heart Healthy Diet ? 40 Therapeutic Level Cholesterol Diet  Flowsheet Row Cardiac Rehab from 03/11/2023 in [facility] Cardiac [facility] Pul[facility] Rehab  Picture Your Plate Total Score on Admission 68  Picture Your Plate Total Score on Discharge 72      Picture Your Plate Scores: <16 Unhealthy dietary pattern with much room for improvement. 41-50 Dietary pattern unlikely to meet recommendations for good health and room for improvement. 51-60 More healthful dietary pattern, with some room for improvement.  >60  Healthy dietary pattern, although there may be some specific behaviors that could be improved.    Nutrition Goals Re-Evaluation:  Nutrition Goals Re-Evaluation     Row Name 02/23/23 [phone]             Goals   Nutrition Goal ST: continue with heart healthy changes made LT:  meet protein needs, include 20-25g of fiber/day, continue with heart healthy changes       Comment [name] reports using whole wheat bread now, but still doesn't have much bread in general she makes vegetable soup. She reports eating sweet potatoes and rice, eating lots of fruit and vegetables, using pork tenderloin and chicken breast, and using olive oil. She reports still chooses heart healthy foods most of the time. Her appetite still fluctuates. Suggested incorporating lentils or beans to rice, she reports liking beans with rice as well as peas. She puts almonds and walnuts in her yogurt now.       Expected Outcome ST: continue with heart healthy changes made LT: meet protein needs, include 20-25g of fiber/day, continue with heart healthy changes  Nutrition Goals Discharge (Final Nutrition Goals Re-Evaluation):  Nutrition Goals Re-Evaluation - 02/23/23 0942       Goals   Nutrition Goal ST: continue with heart healthy changes made LT:  meet protein needs, include 20-25g of fiber/day, continue with heart healthy changes    Comment [name] reports using whole wheat bread now, but still doesn't have much bread in general she makes vegetable soup. She reports eating sweet potatoes and rice, eating lots of fruit and vegetables, using pork tenderloin and chicken breast, and using olive oil. She reports still chooses heart healthy foods most of the time. Her appetite still fluctuates. Suggested incorporating lentils or beans to rice, she reports liking beans with rice as well as peas. She puts almonds and walnuts in her yogurt now.    Expected Outcome ST: continue with heart healthy changes made LT: meet protein  needs, include 20-25g of fiber/day, continue with heart healthy changes             Psychosocial: Target Goals: Acknowledge presence or absence of significant depression and/or stress, maximize coping skills, provide positive support system. Participant is able to verbalize types and ability to use techniques and skills needed for reducing stress and depression.   Education: Stress, Anxiety, and Depression - Group verbal and visual presentation to define topics covered.  Reviews how body is impacted by stress, anxiety, and depression.  Also discusses healthy ways to reduce stress and to treat/manage anxiety and depression.  Written material given at graduation.   Education: Sleep Hygiene -Provides group verbal and written instruction about how sleep can affect your health.  Define sleep hygiene, discuss sleep cycles and impact of sleep habits. Review good sleep hygiene tips.    Initial Review & Psychosocial Screening:  Initial Psych Review & Screening - 01/06/23 1552       Initial Review   Current issues with None Identified      Family Dynamics   Good Support System? Yes   husband  and  daughter     Barriers   Psychosocial barriers to participate in program There are no identifiable barriers or psychosocial needs.      Screening Interventions   Interventions Encouraged to exercise;To provide support and resources with identified psychosocial needs;Provide feedback about the scores to participant    Expected Outcomes Short Term goal: Utilizing psychosocial counselor, staff and physician to assist with identification of specific Stressors or current issues interfering with healing process. Setting desired goal for each stressor or current issue identified.;Long Term Goal: Stressors or current issues are controlled or eliminated.;Short Term goal: Identification and review with participant of any Quality of Life or Depression concerns found by scoring the questionnaire.;Long Term goal:  The participant improves quality of Life and PHQ9 Scores as seen by post scores and/or verbalization of changes             Quality of Life Scores:   Quality of Life - 03/11/23 0946       Quality of Life   Select Quality of Life      Quality of Life Scores   Health/Function Pre 22.4 %    Health/Function Post 28.8 %    Health/Function % Change 28.57 %    Socioeconomic Pre 30 %    Socioeconomic Post 30 %    Socioeconomic % Change  0 %    Psych/Spiritual Pre 26.57 %    Psych/Spiritual Post 30 %    Psych/Spiritual % Change 12.91 %    Family Pre  30 %    Family Post 30 %    Family % Change 0 %    GLOBAL Pre 25.94 %    GLOBAL Post 29.47 %    GLOBAL % Change 13.61 %            Scores of 19 and below usually indicate a poorer quality of life in these areas.  A difference of  2-3 points is a clinically meaningful difference.  A difference of 2-3 points in the total score of the Quality of Life Index has been associated with significant improvement in overall quality of life, self-image, physical symptoms, and general health in studies assessing change in quality of life.  PHQ-9: Review Flowsheet       03/11/2023 02/04/2023 01/10/2023  Depression screen PHQ 2/9  Decreased Interest 0 0 0  Down, Depressed, Hopeless 0 0 0  PHQ - 2 Score 0 0 0  Altered sleeping 0 0 0  Tired, decreased energy 1 1 3   Change in appetite 0 0 2  Feeling bad or failure about yourself  0 0 0  Trouble concentrating 0 0 0  Moving slowly or fidgety/restless 0 0 2  Suicidal thoughts 0 0 0  PHQ-9 Score 1 1 7   Difficult doing work/chores Not difficult at all Not difficult at all Somewhat difficult   Interpretation of Total Score  Total Score Depression Severity:  1-4 = Minimal depression, 5-9 = Mild depression, 10-14 = Moderate depression, 15-19 = Moderately severe depression, 20-27 = Severe depression   Psychosocial Evaluation and Intervention:  Psychosocial Evaluation - 01/06/23 1602        Psychosocial Evaluation & Interventions   Interventions Encouraged to exercise with the program and follow exercise prescription    Comments Tinia has no barriers to attending the program. She lives with her husband and her duaghter is working at home form their home at this time. Her support is her husband and her daughter.   She is ready to get started with the program.    Expected Outcomes STG  Inari is able to attend all scheduled sessions.  She is able to progress with her exercise . LTG Camelle continues her exercise progression after discharge    Continue Psychosocial Services  Follow up required by staff             Psychosocial Re-Evaluation:  Psychosocial Re-Evaluation     Row Name 01/28/23 [phone] 02/04/23 0954 02/23/23 0927         Psychosocial Re-Evaluation   Current issues with Current Stress Concerns Current Stress Concerns None Identified     Comments [name] is off to a good start in rehab.  She does not feel like she has any stressors in life.  She does her best to not let anything get to her too much.  She sees things that she can't do but now is forming her own opinion about how things should be done.  She is sleeping well.  She has noted occassional night sweats since her event. Reviewed patient health questionnaire (PHQ-9) with patient for follow up. Previously, patients score indicated signs/symptoms of depression.  Reviewed to see if patient is improving symptom wise while in program.  Score improved and patient states that it is because they have doing more and feeling better. [name] reports no stress concerns at this time and reports sleeping well. She relies on her husband and daughter for support. She hasn't been outside recently due to pollen, but enjoys the outdoors and  reading. She also loves seeing her grandchildren - she sees them frequently.     Expected Outcomes Short: Continue to build stamina to get back to self Long; Conitnue to stay positive Short: Continue  to attend rehab to build stamina Long: Conitnue to stay positive ST: Continue to attend rehab to build stamina and for mental health boost LT:  Conitnue to stay positive     Interventions Encouraged to attend Cardiac Rehabilitation for the exercise Encouraged to attend Cardiac Rehabilitation for the exercise Encouraged to attend Cardiac Rehabilitation for the exercise     Continue Psychosocial Services  Follow up required by staff -- Follow up required by staff              Psychosocial Discharge (Final Psychosocial Re-Evaluation):  Psychosocial Re-Evaluation - 02/23/23 1610       Psychosocial Re-Evaluation   Current issues with None Identified    Comments [name] reports no stress concerns at this time and reports sleeping well. She relies on her husband and daughter for support. She hasn't been outside recently due to pollen, but enjoys the outdoors and reading. She also loves seeing her grandchildren - she sees them frequently.    Expected Outcomes ST: Continue to attend rehab to build stamina and for mental health boost LT:  Conitnue to stay positive    Interventions Encouraged to attend Cardiac Rehabilitation for the exercise    Continue Psychosocial Services  Follow up required by staff             Vocational Rehabilitation: Provide vocational rehab assistance to qualifying candidates.   Vocational Rehab Evaluation & Intervention:  Vocational Rehab - 01/06/23 1557       Initial Vocational Rehab Evaluation & Intervention   Assessment shows need for Vocational Rehabilitation No      Vocational Rehab Re-Evaulation   Comments retired             Education: Education Goals: Education classes will be provided on a variety of topics geared toward better understanding of heart health and risk factor modification. Participant will state understanding/return demonstration of topics presented as noted by education test scores.  Learning Barriers/Preferences:  Learning  Barriers/Preferences - 01/06/23 1556       Learning Barriers/Preferences   Learning Barriers None    Learning Preferences None             General Cardiac Education Topics:  AED/CPR: - Group verbal and written instruction with the use of models to demonstrate the basic use of the AED with the basic ABC's of resuscitation.   Anatomy and Cardiac Procedures: - Group verbal and visual presentation and models provide information about basic cardiac anatomy and function. Reviews the testing methods done to diagnose heart disease and the outcomes of the test results. Describes the treatment choices: Medical Management, Angioplasty, or Coronary Bypass Surgery for treating various heart conditions including Myocardial Infarction, Angina, Valve Disease, and Cardiac Arrhythmias.  Written material given at graduation.   Medication Safety: - Group verbal and visual instruction to review commonly prescribed medications for heart and lung disease. Reviews the medication, class of the drug, and side effects. Includes the steps to properly store meds and maintain the prescription regimen.  Written material given at graduation. Flowsheet Row Cardiac Rehab from 03/09/2023 in [facility] Cardiac [facility] Pulmonary Rehab  Date 01/19/23  Educator MS  Instruction Review Code 1- Verbalizes Understanding       Intimacy: - Group verbal instruction through game format to discuss how heart  and lung disease can affect sexual intimacy. Written material given at graduation.. Flowsheet Row Cardiac Rehab from 03/09/2023 in [facility] Cardiac and Pulmonary Rehab  Date 02/23/23  Educator [facility]  Instruction Review Code 1- Verbalizes Understanding       Know Your Numbers and Heart Failure: - Group verbal and visual instruction to discuss disease risk factors for cardiac and pulmonary disease and treatment options.  Reviews associated critical values for Overweight/Obesity, Hypertension, Cholesterol, and Diabetes.  Discusses basics  of heart failure: signs/symptoms and treatments.  Introduces Heart Failure Zone chart for action plan for heart failure.  Written material given at graduation. Flowsheet Row Cardiac Rehab from 03/09/2023 in [facility] Cardiac and Pulmonary Rehab  Date 01/26/23  Educator [facility]  Instruction Review Code 1- Verbalizes Understanding       Infection Prevention: - Provides verbal and written material to individual with discussion of infection control including proper hand washing and proper equipment cleaning during exercise session.   Falls Prevention: - Provides verbal and written material to individual with discussion of falls prevention and safety. Flowsheet Row Cardiac Rehab from 03/09/2023 in [facility] Cardiac and Pulmonary Rehab  Date 01/06/23  Educator SB  Instruction Review Code 1- Verbalizes Understanding       Other: -Provides group and verbal instruction on various topics (see comments) Flowsheet Row Cardiac Rehab from 03/09/2023 in [facility] Cardiac and Pulmonary Rehab  Date 01/12/23  Educator SB  Instruction Review Code 1- Verbalizes Understanding       Knowledge Questionnaire Score:  Knowledge Questionnaire Score - 03/11/23 0948       Knowledge Questionnaire Score   Pre Score 19/26    Post Score 22/26             Core Components/Risk Factors/Patient Goals at Admission:  Personal Goals and Risk Factors at Admission - 01/10/23 1411       Core Components/Risk Factors/Patient Goals on Admission    Weight Management Yes;Obesity;Weight Loss    Intervention Weight Management: Develop a combined nutrition and exercise program designed to reach desired caloric intake, while maintaining appropriate intake of nutrient and fiber, sodium and fats, and appropriate energy expenditure required for the weight goal.;Weight Management: Provide education and appropriate resources to help participant work on and attain dietary goals.;Weight Management/Obesity: Establish reasonable short term and  long term weight goals.;Obesity: Provide education and appropriate resources to help participant work on and attain dietary goals.    Admit Weight 147 lb 8 oz (66.9 kg)    Goal Weight: Short Term 143 lb (64.9 kg)    Goal Weight: Long Term 140 lb (63.5 kg)    Expected Outcomes Short Term: Continue to assess and modify interventions until short term weight is achieved;Long Term: Adherence to nutrition and physical activity/exercise program aimed toward attainment of established weight goal;Weight Loss: Understanding of general recommendations for a balanced deficit meal plan, which promotes 1-2 lb weight loss per week and includes a negative energy balance of [phone] kcal/d;Understanding recommendations for meals to include 15-35% energy as protein, 25-35% energy from fat, 35-60% energy from carbohydrates, less than 200mg  of dietary cholesterol, 20-35 gm of total fiber daily;Understanding of distribution of calorie intake throughout the day with the consumption of 4-5 meals/snacks    Diabetes Yes    Intervention Provide education about signs/symptoms and action to take for hypo/hyperglycemia.;Provide education about proper nutrition, including hydration, and aerobic/resistive exercise prescription along with prescribed medications to achieve blood glucose in normal ranges: Fasting glucose 65-99 mg/dL    Expected Outcomes  Short Term: Participant verbalizes understanding of the signs/symptoms and immediate care of hyper/hypoglycemia, proper foot care and importance of medication, aerobic/resistive exercise and nutrition plan for blood glucose control.;Long Term: Attainment of HbA1C < 7%.    Hypertension Yes    Intervention Provide education on lifestyle modifcations including regular physical activity/exercise, weight management, moderate sodium restriction and increased consumption of fresh fruit, vegetables, and low fat dairy, alcohol moderation, and smoking cessation.;Monitor prescription use compliance.     Expected Outcomes Short Term: Continued assessment and intervention until BP is < 140/68mm HG in hypertensive participants. < 130/29mm HG in hypertensive participants with diabetes, heart failure or chronic kidney disease.;Long Term: Maintenance of blood pressure at goal levels.    Lipids Yes    Intervention Provide education and support for participant on nutrition & aerobic/resistive exercise along with prescribed medications to achieve LDL 70mg , HDL >40mg .    Expected Outcomes Short Term: Participant states understanding of desired cholesterol values and is compliant with medications prescribed. Participant is following exercise prescription and nutrition guidelines.;Long Term: Cholesterol controlled with medications as prescribed, with individualized exercise RX and with personalized nutrition plan. Value goals: LDL < , HDL > 40 mg.             Education:Diabetes - Individual verbal and written instruction to review signs/symptoms of diabetes, desired ranges of glucose level fasting, after meals and with exercise. Acknowledge that pre and post exercise glucose checks will be done for 3 sessions at entry of program.   Core Components/Risk Factors/Patient Goals Review:   Goals and Risk Factor Review     Row Name 01/28/23 0949 02/23/23 0930           Core Components/Risk Factors/Patient Goals Review   Personal Goals Review Weight Management/Obesity;Hypertension;Lipids;Diabetes Weight Management/Obesity;Hypertension;Lipids;Diabetes      Review [name] is doing well in rehab.  Her weight is staying steady for the most part.  She was up today as they went out to eat yesterday for a birthday celebration. Her blood sugars are doing well and she checks them daily at home.  Her blood pressures have been runing pretty good in class as well as at home.  Her family has been helping her keep a close eye on both of them.  She is feeling better since her last tweak in medications. [name] is doing  well in rehab, but reports last week after rehab she was lightheaded, tingling in her hands, she was pale, and felt tightness in her chest - she reports going to the doctor; she started feeling better and they left before seeing anyone. Encouraged her to let her doctor know this happened and encouraged her to let us know if she feels this way at rehab right away. She reports that her BG and BP have been doing well. Her weight has been stable.      Expected Outcomes Short: Continue to keep eye on sugars and pressures Long: Conitnue to monitor risk factors. ST: Speak with MD about symptoms week of 3/17 and move appointment for follow up LT: Conitnue to monitor risk factors.               Core Components/Risk Factors/Patient Goals at Discharge (Final Review):   Goals and Risk Factor Review - 02/23/23 0930       Core Components/Risk Factors/Patient Goals Review   Personal Goals Review Weight Management/Obesity;Hypertension;Lipids;Diabetes    Review [name] is doing well in rehab, but reports last week after rehab she was lightheaded, tingling in her hands,  she was pale, and felt tightness in her chest - she reports going to the doctor; she started feeling better and they left before seeing anyone. Encouraged her to let her doctor know this happened and encouraged her to let us know if she feels this way at rehab right away. She reports that her BG and BP have been doing well. Her weight has been stable.    Expected Outcomes ST: Speak with MD about symptoms week of 3/17 and move appointment for follow up LT: Conitnue to monitor risk factors.             ITP Comments:  ITP Comments     Row Name 01/06/23 1601 01/10/23 1352 01/12/23 1005 01/19/23 1202 01/20/23 1033   ITP Comments Virtual orientation call completed today. shehas an appointment on Date: 01/10/2023  for EP eval and gym Orientation.  Documentation of diagnosis can be found in [facility] Date: 12/17/2022 . Completed and gym orientation.  Initial ITP created and sent for review to Dr. Bethann Punches, Medical Director. First full day of exercise!  Patient was oriented to gym and equipment including functions, settings, policies, and procedures.  Patient's individual exercise prescription and treatment plan were reviewed.  All starting workloads were established based on the results of the 6 minute walk test done at initial orientation visit.  The plan for exercise progression was also introduced and progression will be customized based on patient's performance and goals. 30 day review completed. ITP sent to Dr. Bethann Punches, Medical Director of Cardiac Rehab. Continue with ITP unless changes are made by physician.  Recently started on 01/10/23. Completed initial RD consultation    Row Name 02/16/23 0754 03/16/23 0809 03/28/23 0923       ITP Comments 30 Day review completed. Medical Director ITP review done, changes made as directed, and signed approval by Medical Director. 30 day review completed. ITP sent to Dr. Bethann Punches, Medical Director of Cardiac Rehab. Continue with ITP unless changes are made by physician. Torri graduated today from  rehab with 36 sessions completed.  Details of the patient's exercise prescription and what She needs to do in order to continue the prescription and progress were discussed with patient.  Patient was given a copy of prescription and goals.  Patient verbalized understanding.  Kayleana plans to continue to exercise by walking, treadmill, and staff videos.              Comments: Discharge ITP

## 2023-03-28 NOTE — Progress Notes (Signed)
Daily Session Note  Patient Details  Name: Tabia Landowski MRN: [id number] Date of Birth: 11/28/1941 Referring Provider:   Flowsheet Row Cardiac Rehab from 01/10/2023 in [facility]  Referring Provider Massie Maroon MD       Encounter Date: 03/28/2023  Check In:  Session Check In - 03/28/23 [id number]       Check-In   Supervising physician immediately available to respond to emergencies See telemetry face sheet for immediately available ER MD    Location [facility]-Cardiac & Pulmonary Rehab    Staff Present Lanny Hurst, RN, Franki Monte, BS, ACSM CEP, Exercise Physiologist;Noah Tickle, BS, Exercise Physiologist    Virtual Visit No    Medication changes reported     No    Fall or balance concerns reported    No    Warm-up and Cool-down Performed on first and last piece of equipment    Resistance Training Performed Yes    VAD Patient? No    PAD/SET Patient? No      Pain Assessment   Currently in Pain? No/denies                Social History   Tobacco Use  Smoking Status Never  Smokeless Tobacco Never    Goals Met:  Independence with exercise equipment Exercise tolerated well No report of concerns or symptoms today Strength training completed today  Goals Unmet:  Not Applicable  Comments:  Deziya graduated today from  rehab with 36 sessions completed.  Details of the patient's exercise prescription and what She needs to do in order to continue the prescription and progress were discussed with patient.  Patient was given a copy of prescription and goals.  Patient verbalized understanding.  Riannah plans to continue to exercise by walking, treadmill, and staff videos.     Dr. Bethann Punches is Medical Director for [facility] Cardiac Rehabilitation.  Dr. Vida Rigger is Medical Director for [facility] Pulmonary Rehabilitation.

## 2023-12-09 ENCOUNTER — Encounter: Payer: Self-pay | Admitting: Urology

## 2023-12-09 ENCOUNTER — Ambulatory Visit: Payer: Medicare Other | Admitting: Urology

## 2023-12-09 VITALS — BP 166/67 | HR 77 | Ht 64.0 in | Wt 149.0 lb

## 2023-12-09 DIAGNOSIS — Z87440 Personal history of urinary (tract) infections: Secondary | ICD-10-CM | POA: Diagnosis not present

## 2023-12-09 DIAGNOSIS — Z09 Encounter for follow-up examination after completed treatment for conditions other than malignant neoplasm: Secondary | ICD-10-CM

## 2023-12-09 DIAGNOSIS — N390 Urinary tract infection, site not specified: Secondary | ICD-10-CM

## 2023-12-09 LAB — URINALYSIS, COMPLETE
Bilirubin, UA: NEGATIVE
Glucose, UA: NEGATIVE
Ketones, UA: NEGATIVE
Leukocytes,UA: NEGATIVE
Nitrite, UA: NEGATIVE
Protein,UA: NEGATIVE
RBC, UA: NEGATIVE
Specific Gravity, UA: 1.01 (ref 1.005–1.030)
Urobilinogen, Ur: 0.2 mg/dL (ref 0.2–1.0)
pH, UA: 6.5 (ref 5.0–7.5)

## 2023-12-09 LAB — MICROSCOPIC EXAMINATION
Bacteria, UA: NONE SEEN
RBC, Urine: NONE SEEN /[HPF] (ref 0–2)

## 2023-12-09 MED ORDER — TRIMETHOPRIM 100 MG PO TABS: 100.0000 mg | ORAL_TABLET | Freq: Every day | ORAL | 3 refills | Status: DC

## 2023-12-09 NOTE — Progress Notes (Signed)
 I, Maysun LITTIE Griffiths, acting as a scribe for Glendia JAYSON Barba, MD., have documented all relevant documentation on the behalf of Glendia JAYSON Barba, MD, as directed by Glendia JAYSON Barba, MD while in the presence of Glendia JAYSON Barba, MD.  12/09/2023 4:52 PM   [name] Hurst Fulp 07/22/1941 [id number]  Referring provider: Jeffie Cheryl BRAVO, MD [address] [place],  [place] [address]  Chief Complaint  Patient presents with   Recurrent UTI   Urologic history 1. Recurrent UTI Low-dose trimethoprim  suppression  HPI: Anne Ross is a 83 y.o. female presents for annual follow-up.   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  Denies recurrent UTI, and she has been infection-free since starting trimethoprim .  She has no bothersome LUTS.  Denies dysuria, gross hematuria.  No flank, abdominal, or pelvic pain.    PMH: Past Medical History:  Diagnosis Date   Basal cell carcinoma    chest    Cancer (HCC)    uterine   History of COVID-19    Hypertension     Surgical History: Past Surgical History:  Procedure Laterality Date   ABDOMINAL HYSTERECTOMY     BREAST BIOPSY Left    benign 2 times   BREAST BIOPSY Right    neg   BREAST CYST ASPIRATION Left    neg   LEFT HEART CATH AND CORONARY ANGIOGRAPHY N/A 12/17/2022   Procedure: LEFT HEART CATH AND CORONARY ANGIOGRAPHYwith coronary intervention;  Surgeon: Fernand Denyse LABOR, MD;  Location: [facility] INVASIVE CV LAB;  Service: Cardiovascular;  Laterality: N/A;    Home Medications:  Allergies as of 12/09/2023       Reactions   Cortisone Hives   Neuromuscular Blocking Agents Anaphylaxis   steroidal   Other Hives   Prednisone Shortness Of Breath   Rosuvastatin    Other reaction(s): Muscle Pain        Medication List        Accurate as of December 09, 2023  4:52 PM. If you have any questions, ask your nurse or doctor.          STOP taking  these medications    heparin  25000 UT/250ML infusion Stopped by: Glendia JAYSON Barba   nitroGLYCERIN  0.2 mg/mL infusion Stopped by: Glendia JAYSON Barba       TAKE these medications    ascorbic acid 1000 MG tablet Commonly known as: VITAMIN C Take 1,000 mg by mouth daily.   aspirin  EC 81 MG tablet Take 81 mg by mouth daily.   atorvastatin 80 MG tablet Commonly known as: LIPITOR Take 1 tablet by mouth daily.   Calcipotriene 0.005 % solution 1 Application as needed.   Calcium Carbonate-Vitamin D  500-125 MG-UNIT Tabs Take 1 tablet by mouth daily at 10 pm.   cetirizine 10 MG tablet Commonly known as: ZYRTEC Take 10 mg by mouth daily.   chlorhexidine 0.12 % solution Commonly known as: PERIDEX Use as directed 15 mLs in the mouth or throat as needed.   clopidogrel 75 MG tablet Commonly known as: PLAVIX Take 1 tablet by mouth daily.   cyanocobalamin 250 MCG tablet Commonly known as: VITAMIN B12 Take 250 mcg by mouth daily.   Fiber 625 MG Tabs Take 1 tablet by mouth daily.   fluticasone 50 MCG/ACT nasal spray Commonly known as: FLONASE Place into both nostrils daily.   folic acid 800 MCG tablet Commonly known as: FOLVITE Take 400 mcg  by mouth daily.   metoprolol  succinate 50 MG 24 hr tablet Commonly known as: TOPROL -XL TAKE 1 TABLET(50 MG) BY MOUTH EVERY DAY   nitroGLYCERIN  0.4 MG SL tablet Commonly known as: NITROSTAT  Place 1 tablet under the tongue every 5 (five) minutes as needed for chest pain (up to 3 tab).   pantoprazole 40 MG tablet Commonly known as: PROTONIX Take 40 mg by mouth daily.   pimecrolimus 1 % cream Commonly known as: ELIDEL Apply 1 Application topically as needed.   pyridoxine 100 MG tablet Commonly known as: B-6 Take 100 mg by mouth daily.   ramipril 10 MG capsule Commonly known as: ALTACE Take 10 mg by mouth daily.   trimethoprim  100 MG tablet Commonly known as: TRIMPEX  Take 1 tablet (100 mg total) by mouth daily.   TURMERIC  PO Take 1 capsule by mouth as needed.   Vitamin D  50 MCG (2000 UT) tablet Take 2,000 Units by mouth daily.        Allergies:  Allergies  Allergen Reactions   Cortisone Hives   Neuromuscular Blocking Agents Anaphylaxis    steroidal   Other Hives   Prednisone Shortness Of Breath   Rosuvastatin     Other reaction(s): Muscle Pain    Family History: Family History  Problem Relation Age of Onset   Prostate cancer Maternal Grandfather    Breast cancer Neg Hx     Social History:  reports that she has never smoked. She has never used smokeless tobacco. She reports that she does not drink alcohol and does not use drugs.   Physical Exam: BP (!) 166/67   Pulse 77   Ht 5' 4 (1.626 m)   Wt 149 lb (67.6 kg)   BMI 25.58 kg/m   Constitutional:  Alert and oriented, No acute distress. HEENT: [place] AT Respiratory: Normal respiratory effort, no increased work of breathing. Psychiatric: Normal mood and affect.  Urinalysis Dipstick/microscopy negative.    Assessment & Plan:    1. History of recurrent UTI Doing well on low-dose trimethoprim , which she desires to continue; refill sent to pharmacy.  1 year follow-up with UA.   I have reviewed the above documentation for accuracy and completeness, and I agree with the above.   Glendia JAYSON Barba, MD  [facility] [address], [address] [place], [place] [address] [phone]

## 2024-02-09 ENCOUNTER — Encounter: Payer: Self-pay | Admitting: Dermatology

## 2024-02-09 ENCOUNTER — Ambulatory Visit: Payer: Medicare Other | Admitting: Dermatology

## 2024-02-09 DIAGNOSIS — D1801 Hemangioma of skin and subcutaneous tissue: Secondary | ICD-10-CM

## 2024-02-09 DIAGNOSIS — C4491 Basal cell carcinoma of skin, unspecified: Secondary | ICD-10-CM

## 2024-02-09 DIAGNOSIS — R609 Edema, unspecified: Secondary | ICD-10-CM

## 2024-02-09 DIAGNOSIS — C44619 Basal cell carcinoma of skin of left upper limb, including shoulder: Secondary | ICD-10-CM | POA: Diagnosis not present

## 2024-02-09 DIAGNOSIS — L821 Other seborrheic keratosis: Secondary | ICD-10-CM

## 2024-02-09 DIAGNOSIS — W908XXA Exposure to other nonionizing radiation, initial encounter: Secondary | ICD-10-CM | POA: Diagnosis not present

## 2024-02-09 DIAGNOSIS — Z7189 Other specified counseling: Secondary | ICD-10-CM

## 2024-02-09 DIAGNOSIS — D485 Neoplasm of uncertain behavior of skin: Secondary | ICD-10-CM

## 2024-02-09 DIAGNOSIS — L578 Other skin changes due to chronic exposure to nonionizing radiation: Secondary | ICD-10-CM | POA: Diagnosis not present

## 2024-02-09 DIAGNOSIS — D492 Neoplasm of unspecified behavior of bone, soft tissue, and skin: Secondary | ICD-10-CM

## 2024-02-09 DIAGNOSIS — Z1283 Encounter for screening for malignant neoplasm of skin: Secondary | ICD-10-CM | POA: Diagnosis not present

## 2024-02-09 DIAGNOSIS — R600 Localized edema: Secondary | ICD-10-CM

## 2024-02-09 DIAGNOSIS — L814 Other melanin hyperpigmentation: Secondary | ICD-10-CM

## 2024-02-09 DIAGNOSIS — D229 Melanocytic nevi, unspecified: Secondary | ICD-10-CM

## 2024-02-09 DIAGNOSIS — Z85828 Personal history of other malignant neoplasm of skin: Secondary | ICD-10-CM

## 2024-02-09 DIAGNOSIS — L661 Lichen planopilaris, unspecified: Secondary | ICD-10-CM

## 2024-02-09 DIAGNOSIS — L6612 Frontal fibrosing alopecia: Secondary | ICD-10-CM

## 2024-02-09 DIAGNOSIS — L650 Telogen effluvium: Secondary | ICD-10-CM

## 2024-02-09 HISTORY — DX: Basal cell carcinoma of skin, unspecified: C44.91

## 2024-02-09 NOTE — Patient Instructions (Addendum)
 Wound Care Instructions  Cleanse wound gently with soap and water once a day then pat dry with clean gauze. Apply a thin coat of Petrolatum (petroleum jelly, "Vaseline") over the wound (unless you have an allergy to this). We recommend that you use a new, sterile tube of Vaseline. Do not pick or remove scabs. Do not remove the yellow or white "healing tissue" from the base of the wound.  Cover the wound with fresh, clean, nonstick gauze and secure with paper tape. You may use Band-Aids in place of gauze and tape if the wound is small enough, but would recommend trimming much of the tape off as there is often too much. Sometimes Band-Aids can irritate the skin.  You should call the office for your biopsy report after 1 week if you have not already been contacted.  If you experience any problems, such as abnormal amounts of bleeding, swelling, significant bruising, significant pain, or evidence of infection, please call the office immediately.  FOR ADULT SURGERY PATIENTS: If you need something for pain relief you may take 1 extra strength Tylenol (acetaminophen) AND 2 Ibuprofen (200mg  each) together every 4 hours as needed for pain. (do not take these if you are allergic to them or if you have a reason you should not take them.) Typically, you may only need pain medication for 1 to 3 days.   Melanoma ABCDEs  Melanoma is the most dangerous type of skin cancer, and is the leading cause of death from skin disease.  You are more likely to develop melanoma if you: Have light-colored skin, light-colored eyes, or red or blond hair Spend a lot of time in the sun Tan regularly, either outdoors or in a tanning bed Have had blistering sunburns, especially during childhood Have a close family member who has had a melanoma Have atypical moles or large birthmarks  Early detection of melanoma is key since treatment is typically straightforward and cure rates are extremely high if we catch it early.   The  first sign of melanoma is often a change in a mole or a new dark spot.  The ABCDE system is a way of remembering the signs of melanoma.  A for asymmetry:  The two halves do not match. B for border:  The edges of the growth are irregular. C for color:  A mixture of colors are present instead of an even brown color. D for diameter:  Melanomas are usually (but not always) greater than 6mm - the size of a pencil eraser. E for evolution:  The spot keeps changing in size, shape, and color.  Please check your skin once per month between visits. You can use a small mirror in front and a large mirror behind you to keep an eye on the back side or your body.   If you see any new or changing lesions before your next follow-up, please call to schedule a visit.  Please continue daily skin protection including broad spectrum sunscreen SPF 30+ to sun-exposed areas, reapplying every 2 hours as needed when you're outdoors.    Due to recent changes in healthcare laws, you may see results of your pathology and/or laboratory studies on MyChart before the doctors have had a chance to review them. We understand that in some cases there may be results that are confusing or concerning to you. Please understand that not all results are received at the same time and often the doctors may need to interpret multiple results in order to provide you  with the best plan of care or course of treatment. Therefore, we ask that you please give [place] 2 business days to thoroughly review all your results before contacting the office for clarification. Should we see a critical lab result, you will be contacted sooner.   If You Need Anything After Your Visit  If you have any questions or concerns for your doctor, please call our main line at [phone] and press option 4 to reach your doctor's medical assistant. If no one answers, please leave a voicemail as directed and we will return your call as soon as possible. Messages left after 4  pm will be answered the following business day.   You may also send [place] a message via MyChart. We typically respond to MyChart messages within 1-2 business days.  For prescription refills, please ask your pharmacy to contact our office. Our fax number is [phone].  If you have an urgent issue when the clinic is closed that cannot wait until the next business day, you can page your doctor at the number below.    Please note that while we do our best to be available for urgent issues outside of office hours, we are not available 24/7.   If you have an urgent issue and are unable to reach [place], you may choose to seek medical care at your doctor's office, retail clinic, urgent care center, or emergency room.  If you have a medical emergency, please immediately call 911 or go to the emergency department.  Pager Numbers  - Dr. Gwen Pounds: [phone]  - Dr. Roseanne Reno: [phone]  - Dr. Katrinka Blazing: [phone]   In the event of inclement weather, please call our main line at [phone] for an update on the status of any delays or closures.  Dermatology Medication Tips: Please keep the boxes that topical medications come in in order to help keep track of the instructions about where and how to use these. Pharmacies typically print the medication instructions only on the boxes and not directly on the medication tubes.   If your medication is too expensive, please contact our office at [phone] option 4 or send [place] a message through MyChart.   We are unable to tell what your co-pay for medications will be in advance as this is different depending on your insurance coverage. However, we may be able to find a substitute medication at lower cost or fill out paperwork to get insurance to cover a needed medication.   If a prior authorization is required to get your medication covered by your insurance company, please allow [place] 1-2 business days to complete this process.  Drug prices often vary  depending on where the prescription is filled and some pharmacies may offer cheaper prices.  The website www.goodrx.com contains coupons for medications through different pharmacies. The prices here do not account for what the cost may be with help from insurance (it may be cheaper with your insurance), but the website can give you the price if you did not use any insurance.  - You can print the associated coupon and take it with your prescription to the pharmacy.  - You may also stop by our office during regular business hours and pick up a GoodRx coupon card.  - If you need your prescription sent electronically to a different pharmacy, notify our office through [facility] or by phone at [phone] option 4.     Si Usted Necesita Algo Despus de Su Visita  Tambin puede enviarnos un mensaje a  travs de MyChart. Por lo general respondemos a los mensajes de MyChart en el transcurso de 1 a 2 das hbiles.  Para renovar recetas, por favor pida a su farmacia que se ponga en contacto con nuestra oficina. Annie Sable de fax es [place] [phone].  Si tiene un asunto urgente cuando la clnica est cerrada y que no puede esperar hasta el siguiente da hbil, puede llamar/localizar a su doctor(a) al nmero que aparece a continuacin.   Por favor, tenga en cuenta que aunque hacemos todo lo posible para estar disponibles para asuntos urgentes fuera del horario de Edie, no estamos disponibles las 24 horas del da, los 7 [address]  [address] de la [place].   Si tiene un problema urgente y no puede comunicarse con nosotros, puede optar por buscar atencin mdica  en el consultorio de su doctor(a), en una clnica privada, en un centro de atencin urgente o en una sala de emergencias.  Si tiene Engineer, drilling, por favor llame inmediatamente al 911 o vaya a la sala de emergencias.  Nmeros de bper  - Dr. Gwen Pounds: [phone]  - Dra. Roseanne Reno: [phone]  - Dr. Katrinka Blazing: [phone]   En caso de  inclemencias del tiempo, por favor llame a Lacy Duverney principal al [phone] para una actualizacin sobre el [place] de cualquier retraso o cierre.  Consejos para la medicacin en dermatologa: Por favor, guarde las cajas en las que vienen los medicamentos de uso tpico para ayudarle a seguir las instrucciones sobre dnde y cmo usarlos. Las farmacias generalmente imprimen las instrucciones del medicamento slo en las cajas y no directamente en los tubos del [place].   Si su medicamento es muy caro, por favor, pngase en contacto con Rolm Gala llamando al [phone] y presione la opcin 4 o envenos un mensaje a travs de Clinical cytogeneticist.   No podemos decirle cul ser su copago por los medicamentos por adelantado ya que esto es diferente dependiendo de la cobertura de su seguro. Sin embargo, es posible que podamos encontrar un medicamento sustituto a Audiological scientist un formulario para que el seguro cubra el medicamento que se considera necesario.   Si se requiere una autorizacin previa para que su compaa de seguros [place] su medicamento, por favor permtanos de 1 a 2 das hbiles para completar [address]  Los precios de los medicamentos varan con frecuencia dependiendo del Environmental consultant de dnde se surte la receta y alguna farmacias pueden ofrecer precios ms baratos.  El sitio web www.goodrx.com tiene cupones para medicamentos de Health and safety inspector. Los precios aqu no tienen en cuenta lo que podra costar con la ayuda del seguro (puede ser ms barato con su seguro), pero el sitio web puede darle el precio si no utiliz Tourist information centre manager.  - Puede imprimir el cupn correspondiente y llevarlo con su receta a la farmacia.  - Tambin puede pasar por nuestra oficina durante el horario de atencin regular y Education officer, museum una tarjeta de cupones de GoodRx.  - Si necesita que su receta se enve electrnicamente a una farmacia diferente, informe a nuestra oficina a travs de MyChart de [place] o  por telfono llamando al [phone] y presione la opcin 4.

## 2024-02-09 NOTE — Progress Notes (Signed)
 Follow-Up Visit   Subjective  Anne Ross is a 83 y.o. female who presents for the following: Skin Cancer Screening and Full Body Skin Exam  The patient presents for Total-Body Skin Exam (TBSE) for skin cancer screening and mole check. The patient has spots, moles and lesions to be evaluated, some may be new or changing and the patient may have concern these could be cancer.  Hx BCC. Patient has a spot at left arm that won't heal, a spot at left jaw that is soft and movable.   The following portions of the chart were reviewed this encounter and updated as appropriate: medications, allergies, medical history  Review of Systems:  No other skin or systemic complaints except as noted in HPI or Assessment and Plan.  Objective  Well appearing patient in no apparent distress; mood and affect are within normal limits.  A full examination was performed including scalp, head, eyes, ears, nose, lips, neck, chest, axillae, abdomen, back, buttocks, bilateral upper extremities, bilateral lower extremities, hands, feet, fingers, toes, fingernails, and toenails. All findings within normal limits unless otherwise noted below.   Relevant physical exam findings are noted in the Assessment and Plan.  left mid dorsal forearm 1.2 cm pink crusted patch   Assessment & Plan   SKIN CANCER SCREENING PERFORMED TODAY.  ACTINIC DAMAGE - Chronic condition, secondary to cumulative UV/sun exposure - diffuse scaly erythematous macules with underlying dyspigmentation - Recommend daily broad spectrum sunscreen SPF 30+ to sun-exposed areas, reapply every 2 hours as needed.  - Staying in the shade or wearing long sleeves, sun glasses (UVA+UVB protection) and wide brim hats (4-inch brim around the entire circumference of the hat) are also recommended for sun protection.  - Call for new or changing lesions.  LENTIGINES, SEBORRHEIC KERATOSES, HEMANGIOMAS - Benign normal skin lesions - Benign-appearing -  Call for any changes  MELANOCYTIC NEVI - Tan-brown and/or pink-flesh-colored symmetric macules and papules - Benign appearing on exam today - Observation - Call clinic for new or changing moles - Recommend daily use of broad spectrum spf 30+ sunscreen to sun-exposed areas.   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 Condition is symptomatic / bothersome to patient. Not to goal. Frontal fibrosing alopecia (FFA) is a chronic/progressive and irreversible patterned form of scarring alopecia that presents as a symmetric band-like zone of hair loss along the anterior hairline. The eyebrows are often thinned or absent. It is considered a variant of lichen planopilaris in a patterned distribution and is more common in women. Therapies may stop or slow progression but generally do not lead to hair regrowth.  Telogen Effluvium Counseling Telogen effluvium is a benign, self-limited condition causing increased hair shedding usually for several months. It does not progress to baldness, and the hair eventually grows back on its own. It can be triggered by recent illness, recent surgery, thyroid disease, low iron stores, vitamin D deficiency, fad diets or rapid weight loss, hormonal changes such as pregnancy or birth control pills, and some medication. Usually the hair loss starts 2-3 months after the illness or health change. Rarely, it can continue for longer than a year. Treatments options may include oral or topical Minoxidil; Red Light scalp treatments; Biotin 2.5 mg daily and other options.   Pt has been seen by Dr. Isaac Laud in the past. Pt is allergic to steroids, so  she is not using a topical steroid for FFA   Discussed starting non-steroidal topicals such as Opzelura but patient declines since hair that has been lost will not grow  back.    She has decided she will not treat her FFA / LPP at all.        SWELLING left cheek Exam: swelling at left cheek, slightly tender to palpation, skin is normal Treatment Plan: Will send referral to ENT for evaluation for possible salivary gland or parotid issue    HISTORY OF BASAL CELL CARCINOMA OF THE SKIN - No evidence of recurrence today - Recommend regular full body skin exams - Recommend daily broad spectrum sunscreen SPF 30+ to sun-exposed areas, reapply every 2 hours as needed.  - Call if any new or changing lesions are noted between office visits  NEOPLASM OF UNCERTAIN BEHAVIOR OF SKIN left mid dorsal forearm Epidermal / dermal shaving  Lesion diameter (cm):  1.2 Informed consent: discussed and consent obtained   Timeout: patient name, date of birth, surgical site, and procedure verified   Procedure prep:  Patient was prepped and draped in usual sterile fashion Prep type:  Isopropyl alcohol Anesthesia: the lesion was anesthetized in a standard fashion   Anesthetic:  1% lidocaine w/ epinephrine 1-100,000 buffered w/ 8.4% NaHCO3 Instrument used: flexible razor blade   Hemostasis achieved with: pressure, aluminum chloride and electrodesiccation   Outcome: patient tolerated procedure well   Post-procedure details: sterile dressing applied and wound care instructions given   Dressing type: bandage and petrolatum    Destruction of lesion Complexity: extensive   Destruction method: electrodesiccation and curettage   Informed consent: discussed and consent obtained   Timeout:  patient name, date of birth, surgical site, and procedure verified Procedure prep:  Patient was prepped and draped in usual sterile fashion Prep type:  Isopropyl alcohol Anesthesia: the lesion was anesthetized in a standard fashion   Anesthetic:  1% lidocaine w/ epinephrine 1-100,000 buffered w/ 8.4% NaHCO3 Curettage performed in three different directions: Yes   Electrodesiccation  performed over the curetted area: Yes   Final wound size (cm):  1.5 Hemostasis achieved with:  pressure, aluminum chloride and electrodesiccation Outcome: patient tolerated procedure well with no complications   Post-procedure details: sterile dressing applied and wound care instructions given   Dressing type: bandage and petrolatum   Specimen 1 - Surgical pathology Differential Diagnosis: r/o BCC  Check Margins: No 1.2 cm pink crusted patch Treated with EDC  SKIN CANCER SCREENING   ACTINIC SKIN DAMAGE   LENTIGO   MELANOCYTIC NEVUS, UNSPECIFIED LOCATION   SWELLING   HISTORY OF BASAL CELL CARCINOMA   LICHEN PLANO-PILARIS   FRONTAL FIBROSING ALOPECIA   COUNSELING AND COORDINATION OF CARE   Return in about 1 year (around 02/08/2025) for TBSE, with Dr. Kirtland Bouchard, [facility].  Anise Salvo, RMA, am acting as scribe for Armida Sans, MD .   Documentation: I have reviewed the above documentation for accuracy and completeness, and I agree with the above.  Armida Sans, MD

## 2024-02-13 ENCOUNTER — Encounter: Payer: Self-pay | Admitting: Dermatology

## 2024-02-13 ENCOUNTER — Telehealth: Payer: Self-pay

## 2024-02-13 DIAGNOSIS — D489 Neoplasm of uncertain behavior, unspecified: Secondary | ICD-10-CM

## 2024-02-13 LAB — SURGICAL PATHOLOGY

## 2024-02-13 NOTE — Telephone Encounter (Signed)
 Patient informed of pathology results. She asked if she needed to call and schedule ENT appointment for lesion on the L cheek or did our office make the referral. Referral sent to [facility], pt prefers [place], [place] location.

## 2024-02-13 NOTE — Telephone Encounter (Signed)
-----   Message from Armida Sans sent at 02/13/2024  5:40 PM EDT ----- FINAL DIAGNOSIS        1. Skin, left mid dorsal forearm :       BASAL CELL CARCINOMA, SUPERFICIAL AND NODULAR PATTERNS   Cancer = BCC Already treated Recheck next visit

## 2024-03-03 ENCOUNTER — Ambulatory Visit (INDEPENDENT_AMBULATORY_CARE_PROVIDER_SITE_OTHER)

## 2024-03-03 ENCOUNTER — Encounter: Payer: Self-pay | Admitting: Emergency Medicine

## 2024-03-03 ENCOUNTER — Ambulatory Visit
Admission: EM | Admit: 2024-03-03 | Discharge: 2024-03-03 | Disposition: A | Discharge status: Home / Self Care | Attending: Family Medicine | Admitting: Family Medicine

## 2024-03-03 DIAGNOSIS — R051 Acute cough: Secondary | ICD-10-CM

## 2024-03-03 DIAGNOSIS — J22 Unspecified acute lower respiratory infection: Secondary | ICD-10-CM

## 2024-03-03 MED ORDER — BENZONATATE 200 MG PO CAPS: 200.0000 mg | ORAL_CAPSULE | Freq: Three times a day (TID) | ORAL | 0 refills | Status: DC | PRN

## 2024-03-03 MED ORDER — AZITHROMYCIN 250 MG PO TABS: 250.0000 mg | ORAL_TABLET | Freq: Every day | ORAL | 0 refills | Status: DC

## 2024-03-03 NOTE — ED Provider Notes (Signed)
 [facility]    CSN: [id number] Arrival date & time: 03/03/24  1318      History   Chief Complaint Chief Complaint  Patient presents with   Cough    HPI Anne Ross is a 83 y.o. female  presents for evaluation of URI symptoms for 7 days. Patient reports associated symptoms of cough, congestion, fevers, body aches, decreased appetite, shortness of breath.  Did some vomiting a couple days ago but this is since resolved.  Denies diarrhea, ear pain. Patient does note have a hx of asthma. Patient is not an active smoker.   Reports has been has similar symptoms.  Pt has taken Tylenol OTC for symptoms. Pt has no other concerns at this time.    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   Diarrhea, functional 08/25/2017   History of colon polyps 08/25/2017   Pyelonephritis, acute 02/19/2016   Sepsis due to urinary tract infection (HCC) 02/17/2016   Low HDL (under 40) 08/18/2015   Essential hypertension 07/30/2014   Fibromyalgia 07/30/2014   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      Home Medications    Prior to Admission medications   Medication Sig Start Date End Date Taking? Authorizing Provider  azithromycin (ZITHROMAX) 250 MG tablet Take 1 tablet (250 mg total) by mouth daily. Take first 2 tablets together, then 1 every day until finished. 03/06/24  Yes Radford Pax, NP  benzonatate (TESSALON) 200 MG capsule Take 1 capsule (200 mg total) by mouth 3 (three) times daily as needed. 03/03/24  Yes Radford Pax, NP  amLODipine (NORVASC) 5 MG tablet Take 5 mg by mouth daily.    [provider]  ascorbic acid (VITAMIN C) 1000 MG tablet Take 1,000 mg by mouth daily.    [provider]  aspirin EC 81 MG tablet Take 81 mg by mouth daily.    [provider]  atorvastatin (LIPITOR) 80 MG tablet Take 1 tablet by mouth daily. 12/22/22   [provider]  Calcipotriene 0.005 % solution 1 Application as needed. 07/11/19   [provider]  Calcium Carbonate-Vitamin D 500-125 MG-UNIT TABS Take 1 tablet by mouth daily at 10 pm.    [provider]  Calcium Polycarbophil (FIBER) 625 MG TABS Take 1  tablet by mouth daily.    [provider]  carvedilol (COREG) 6.25 MG tablet Take 6.25 mg by mouth 2 (two) times daily.    [provider]  cetirizine (ZYRTEC) 10 MG tablet Take 10 mg by mouth daily.    [provider]  chlorhexidine (PERIDEX) 0.12 % solution Use as directed 15 mLs in the mouth or throat as needed. 02/15/20   [provider]  Cholecalciferol (VITAMIN D) 2000 units tablet Take 2,000 Units by mouth daily.    [provider]  fluticasone (FLONASE) 50 MCG/ACT nasal spray Place into both nostrils daily.    [provider]  folic acid (FOLVITE) 800 MCG tablet Take 400 mcg by mouth daily.    [provider]   metoprolol succinate (TOPROL-XL) 50 MG 24 hr tablet TAKE 1 TABLET(50 MG) BY MOUTH EVERY DAY 07/02/19   [provider]  nitroGLYCERIN (NITROSTAT) 0.4 MG SL tablet Place 1 tablet under the tongue every 5 (five) minutes as needed for chest pain (up to 3 tab). 12/21/22   [provider]  pantoprazole (PROTONIX) 40 MG tablet Take 40 mg by mouth daily.    [provider]  pimecrolimus (ELIDEL) 1 % cream Apply 1 Application topically as needed.    [provider]  pyridoxine (B-6) 100 MG tablet Take 100 mg by mouth daily.    [provider]  ramipril (ALTACE) 10 MG capsule Take 10 mg by mouth daily.    [provider]  trimethoprim (TRIMPEX) 100 MG tablet Take 1 tablet (100 mg total) by mouth daily. 12/09/23   Stoioff, Verna Czech, MD  TURMERIC PO Take 1 capsule by mouth as needed.    [provider]  vitamin B-12 (CYANOCOBALAMIN) 250 MCG tablet Take 250 mcg by mouth daily.    [provider]    Family History Family History  Problem Relation Age of Onset   Prostate cancer Maternal Grandfather    Breast cancer Neg Hx     Social History Social History   Tobacco Use   Smoking status: Never   Smokeless tobacco: Never  Vaping Use   Vaping status: Never Used  Substance Use Topics   Alcohol use: No   Drug use: No     Allergies   Cortisone, Neuromuscular blocking agents, Other, Prednisone, and Rosuvastatin   Review of Systems Review of Systems  Constitutional:  Positive for appetite change.  HENT:  Positive for congestion.   Respiratory:  Positive for cough and shortness of breath.   Musculoskeletal:  Positive for myalgias.     Physical Exam Triage Vital Signs ED Triage Vitals  Encounter Vitals Group     BP 03/03/24 1334 (!) 155/75     Systolic BP Percentile --      Diastolic BP Percentile --      Pulse Rate 03/03/24 1334 61     Resp 03/03/24 1334 18     Temp 03/03/24 1334 98.5 F (36.9 C)     Temp Source  03/03/24 1334 Oral     SpO2 03/03/24 1334 96 %     Weight 03/03/24 1333 149 lb 0.5 oz (67.6 kg)     Height 03/03/24 1333 5\' 4"  (1.626 m)     Head Circumference --      Peak Flow --      Pain Score 03/03/24 1333 5     Pain Loc --      Pain Education --      Exclude from  Growth Chart --    No data found.  Updated Vital Signs BP (!) 155/75 (BP Location: Left Arm)   Pulse 61   Temp 98.5 F (36.9 C) (Oral)   Resp 18   Ht 5\' 4"  (1.626 m)   Wt 149 lb 0.5 oz (67.6 kg)   SpO2 96%   BMI 25.58 kg/m   Visual Acuity Right Eye Distance:   Left Eye Distance:   Bilateral Distance:    Right Eye Near:   Left Eye Near:    Bilateral Near:     Physical Exam Vitals and nursing note reviewed.  Constitutional:      General: She is not in acute distress.    Appearance: She is well-developed. She is not ill-appearing.  HENT:     Head: Normocephalic and atraumatic.     Right Ear: Tympanic membrane and ear canal normal.     Left Ear: Tympanic membrane and ear canal normal.     Nose: Congestion present.     Mouth/Throat:     Mouth: Mucous membranes are moist.     Pharynx: Oropharynx is clear. Uvula midline. No oropharyngeal exudate or posterior oropharyngeal erythema.     Tonsils: No tonsillar exudate or tonsillar abscesses.  Eyes:     Conjunctiva/sclera: Conjunctivae normal.     Pupils: Pupils are equal, round, and reactive to light.  Cardiovascular:     Rate and Rhythm: Normal rate and regular rhythm.     Heart sounds: Normal heart sounds.  Pulmonary:     Effort: Pulmonary effort is normal.     Breath sounds: Normal breath sounds. No wheezing, rhonchi or rales.  Musculoskeletal:     Cervical back: Normal range of motion and neck supple.  Lymphadenopathy:     Cervical: No cervical adenopathy.  Skin:    General: Skin is warm and dry.  Neurological:     General: No focal deficit present.     Mental Status: She is alert and oriented to person, place, and time.  Psychiatric:         Mood and Affect: Mood normal.        Behavior: Behavior normal.      UC Treatments / Results  Labs (all labs ordered are listed, but only abnormal results are displayed) Labs Reviewed - No data to display  EKG   Radiology DG Chest 2 View Result Date: 03/03/2024 CLINICAL DATA:  Cough and fever for 1 week. EXAM: CHEST - 2 VIEW COMPARISON:  None Available. FINDINGS: The heart size and mediastinal contours are within normal limits. Both lungs are clear. The visualized skeletal structures are unremarkable. IMPRESSION: No active cardiopulmonary disease. Electronically Signed   By: Danae Orleans M.D.   On: 03/03/2024 14:16    Procedures Procedures (including critical care time)  Medications Ordered in UC Medications - No data to display  Initial Impression / Assessment and Plan / UC Course  I have reviewed the triage vital signs and the nursing notes.  Pertinent labs & imaging results that were available during my care of the patient were reviewed by me and considered in my medical decision making (see chart for details).     Reviewed exam and symptoms with patient.  No red flags.  Chest x-ray negative for pneumonia.  Discussed bronchitis.  Will do Tessalon as needed for cough.  Provisional prescription for Zithromax provided with instruction not to take unless symptoms do not improve or worsen over the next 2 to 3 days and patient verbalized  understanding.  Advised PCP follow-up 2 to 3 days for recheck.  ER precautions reviewed and patient verbalized understanding. Final Clinical Impressions(s) / UC Diagnoses   Final diagnoses:  Acute cough  Lower respiratory infection (e.g., bronchitis, pneumonia, pneumonitis, pulmonitis)     Discharge Instructions      Start Tessalon 3 times a day as needed for your cough.  Lots of rest and fluids.  A provisional prescription for azithromycin which is an antibiotic has been provided.  Please do not take this or symptoms do not improve or worsen  over the next 2 to 3 days.  Follow-up with your PCP in 2 to 3 days for recheck.  Please go to the ER for any worsening symptoms.  I hope you feel better soon!     ED Prescriptions     Medication Sig Dispense Auth. Provider   benzonatate (TESSALON) 200 MG capsule Take 1 capsule (200 mg total) by mouth 3 (three) times daily as needed. 20 capsule Radford Pax, NP   azithromycin (ZITHROMAX) 250 MG tablet Take 1 tablet (250 mg total) by mouth daily. Take first 2 tablets together, then 1 every day until finished. 6 tablet Radford Pax, NP      PDMP not reviewed this encounter.   Radford Pax, NP 03/03/24 1426

## 2024-03-03 NOTE — ED Triage Notes (Signed)
 Pt c/o cough, fever, body aches, no appetite. Started about a week ago. She states her fever has resolved.

## 2024-03-03 NOTE — Discharge Instructions (Addendum)
 Start Tessalon 3 times a day as needed for your cough.  Lots of rest and fluids.  A provisional prescription for azithromycin which is an antibiotic has been provided.  Please do not take this or symptoms do not improve or worsen over the next 2 to 3 days.  Follow-up with your PCP in 2 to 3 days for recheck.  Please go to the ER for any worsening symptoms.  I hope you feel better soon!

## 2024-03-07 ENCOUNTER — Other Ambulatory Visit: Payer: Self-pay | Admitting: Otolaryngology

## 2024-03-07 DIAGNOSIS — R220 Localized swelling, mass and lump, head: Secondary | ICD-10-CM

## 2024-03-13 ENCOUNTER — Ambulatory Visit
Admission: RE | Admit: 2024-03-13 | Discharge: 2024-03-13 | Disposition: A | Discharge status: Home / Self Care | Source: Ambulatory Visit | Attending: Otolaryngology | Admitting: Otolaryngology

## 2024-03-13 DIAGNOSIS — R220 Localized swelling, mass and lump, head: Secondary | ICD-10-CM

## 2024-03-13 MED ORDER — IOPAMIDOL (ISOVUE-300) INJECTION 61%
75.0000 mL | Freq: Once | INTRAVENOUS | Status: AC | PRN
  Administered 2024-03-13: 75 mL via INTRAVENOUS

## 2024-08-09 NOTE — H&P (View-Only) (Signed)
 [facility] Cardiology at [facility] [address], [place], [place] [address]  Phone: [phone]  Date of Service: 08/13/2024  Patient Clinic Note  PCP: Referring Provider:  Jeffie Cheryl Menghini, MD [address] [facility] [place] [address] Phone: [phone] Fax: [phone] Jeffie Cheryl Menghini, MD [address] [facility] [address] [facility] [place],  [place] [address] Phone: [phone] Fax: [phone]    Assessment and Plan:   Ms. Laufer is a 83 y.o. female with past medical history of hypertension, hyperlipidemia, recurrent UTIs, NSTEMI with multivessel coronary artery disease status post PCI to proximal LAD and left main-proximal circumflex 12/20/2022, who presents for cardiac care.  angina with history of NSTEMI with multivessel coronary artery disease status post PCI to proximal LAD and left main-proximal circumflex 12/20/2022  Status post balloon angioplasty to proximal circumflex and LAD 07/16/2024 Hyperlipidemia -Originally doing well after stent placement in May 2024 with resolution of her symptoms.  However over the past few months has had progressive dyspnea with exertion and severe fatigue with exertion and last visit was having chest tightness with exertion so patient underwent left heart catheterization which revealed.  90% ostial circumflex disease treated with balloon angioplasty, with plaque shift into the LAD and He was treated with balloon angioplasty as well with good result afterwards.  Patient reports feeling a bit better after this intervention, but over the past 2 weeks patient has had severe dyspnea with exertion chest pain with exertion even with walking about 20 feet, and intermittent diaphoresis lightheadedness and presyncope.  Overall this is consistent with her prior anginal symptoms.  We discussed that her be extremely unlikely clinically resolved from the intervention last month [place]  blockages have developed, but she did have significant plaque shift and return of her symptoms and thus I think we have to take another look with left heart catheterization to evaluate for recurrence of disease. - Patient with significant chest tightness in the clinic after walking about 20 feet down, lab for blood work.  Resolved with cessation of activity. - Trial Imdur 30 mg daily - Continue aspirin , Plavix, statin  - sx prior to stents in the past = jaw and shoulder pain, no real chest pain.  The symptoms are reminiscent of what is going on currently. - continue coreg. Given intermittent pre syncope would not uptitrate  - If left heart catheterization is unrevealing would likely trial off of Coreg to see if it has significant effect versus trial off of statin. - CK, BMP today  Hypertension - with labile BPs at home. Elevated here today. Continue ramipril, amlodipine, Coreg 6.25 twice daily. 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  Would not recommend further imaging or workup in this regard.  Palpitations - With associated dizziness and lightheadedness.  Zio patch unrevealing 06/2024     Lab Results  Component Value Date   CHOL 125 07/16/2024   CHOL 123 09/06/2023   CHOL 165 12/17/2022   Lab Results  Component Value Date   HDL 36 (L) 07/16/2024   HDL 33 (L) 09/06/2023   HDL 29 (L) 12/17/2022   Lab Results  Component Value Date   LDL 64 07/16/2024   LDL 50 09/06/2023   LDL 87 12/17/2022   Lab Results  Component Value Date  VLDL 39.8 09/06/2023   VLDL 48.6 (H) 12/17/2022   Lab Results  Component Value Date   CHOLHDLRATIO 3.7 09/06/2023   CHOLHDLRATIO 5.7 (H) 12/17/2022   Lab Results  Component Value Date   TRIG 171 (H) 07/16/2024   TRIG  199 (H) 09/06/2023   TRIG 243 (H) 12/17/2022    The 10-yr ASCVD Risk score Verdon DC Jr., et al., 2013) failed to calculate due to the following reason: The 2013 10-yr ASCVD risk score is only valid if the patient does not have prior/existing clinical ASCVD (myocardial infarction, stroke, CABG, coronary angioplasty, angina or peripheral arterial disease, coronary atherosclerosis, ischemic heart disease, or cerebrovascular disease). The patient has prior/existing ASCVD. Had Heart Attack  Had Coronary Angioplasty Has Coronary Atherosclerosis   Lab Results  Component Value Date   A1C 6.1 (H) 07/16/2024     Return in about 2 weeks (around 08/27/2024).     Subjective:   Chief Complaint: Chief Complaint  Patient presents with  . 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  Recent cardiac cath.  Continues to feel weak and fatigued, SOB, intermittent CP at rest.  States sx unchanged from last visit     Referring Provider: Jeffie Cheryl Menghini*  History of Present Illness:   Ms. Fader is a 83 y.o. female, with past medical history of hypertension, hyperlipidemia, recurrent UTIs, NSTEMI with multivessel coronary artery disease status post PCI to proximal LAD and left main-proximal circumflex 12/20/2022 who presents for cardiac care.  History of Present Illness Amelie Caracci is an 83 year old female with multivessel coronary artery disease who presents for a cardiology evaluation.  She underwent a left heart catheterization since her last visit, revealing severe ostial circumflex stenosis and a seventy percent ostial LAD stenosis, both treated with balloon angioplasty.  She experiences persistent symptoms of nausea, dizziness, lack of energy, and exhaustion with minimal exertion. Activities such as folding laundry, keeping the kitchen clean, and taking a shower result in significant fatigue and shortness of breath  as well as chest tightness / chest pain.  She describes feeling 'exhausted' after these activities.  She notes episodes of chest pain and difficulty breathing, particularly with exertion. During a severe episode, her blood pressure dropped to 105/37 but later returned to 124/55. Due to her symptoms, she has not been driving.  During a recent trip to the beach, she was unable to walk short distances without feeling 'nonfunctional' and required the use of a transporter wheelchair and shuttle service. She describes losing energy and feeling weak, with difficulty putting one foot in front of the other.  She experiences nausea, sweating, dizziness, and chest pressure when pushing herself physically. No jaw or shoulder pain, but she reports occasional tingling in her arm. She has not experienced syncope but feels she might if she continues exerting herself.  She did not take her blood pressure medication on the morning of the visit due to nausea. Her regimen includes carvedilol and atorvastatin, among other medications.       ----presenting HPI ---- Patient states she is doing well since discharge from the hospital 12/21/2022.  She notes that the symptoms she had prior to presentation were several days of ongoing chest heaviness, left arm numbness and pain, bilateral jaw pain.  This was typically worse with exertion and stress and better with rest.  It progressed to the point where it did not go away and patient called EMS.  She had  2 drug-eluting stents placed to her proximal LAD and left main-circumflex for NSTEMI.  Now doing cardiac rehab and doing well at [facility].  Compliant with her medications.  No complaints today.  Cardiovascular History & Procedures: Cardiovascular Problems: NSTEMI with multivessel coronary artery disease status post PCI to proximal LAD and left main-proximal circumflex 12/20/2022 Hypertension Hyperlipidemia  Cath / PCI: 12/20/2022-[facility]-NSTEMI with multivessel coronary  artery disease status post PCI to proximal LAD and left main-proximal circumflex 12/20/2022 Left heart cath 07/16/2024-90% ostial circumflex disease managed with balloon angioplasty .  Resulting in 70% ostial LAD stenosis also management with balloon angioplasty  CV Surgery:  none  EP Procedures and Devices: none  Non-Invasive Evaluation(s): independently reviewed the most recent study.  Echocardiogram 12/21/2022-normal LVEF 60-65%, normal RV size and function Echocardiogram 09/14/2023-normal biventricular size and function, no significant valvular abnormalities Zio patch 10/04/23 - no significant arrhythmias / heart block  Echocardiogram 07/10/2024-normal biventricular size and function mild MR Zio patch 06/2024-no notable arrhythmias or heart block  Medical History: Past Medical History:  Diagnosis Date  . Fibromyalgia   . Hypertension   . IBS (irritable bowel syndrome)   . Migraine   . Osteoarthritis   . Osteopenia     Surgical History: Past Surgical History:  Procedure Laterality Date  . CATARACT EXTRACTION    . HYSTERECTOMY    . PR CATH PLACE/CORON ANGIO, IMG SUPER/INTERP,W LEFT HEART VENTRICULOGRAPHY N/A 12/20/2022   Procedure: Left Heart Catheterization With Intervention;  Surgeon: Marybella Zachary Barter, MD;  Location: [facility] CATH;  Service: Cardiology  . PR CATH PLACE/CORON ANGIO, IMG SUPER/INTERP,W LEFT HEART VENTRICULOGRAPHY N/A 07/16/2024   Procedure: Left Heart Catheterization With Intervention;  Surgeon: Eloy Fairy Rigg, MD;  Location: [facility] CATH;  Service: Cardiology  . TONSILLECTOMY      Social History:  reports that she has never smoked. She has never been exposed to tobacco smoke. She has never used smokeless tobacco. She reports that she does not drink alcohol and does not use drugs. 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 Brother with MI in 30s Very strong family history   Review of Systems:  Except as noted in the HPI, the remainder of 10 systems reviewed is negative.   Allergies: Allergies  Allergen Reactions  . Cortisone Shortness Of Breath  . Neuromuscular Blockers, Steroidal Shortness Of Breath, Swelling, Anaphylaxis and Hives  . Other Hives  . Prednisone Shortness Of Breath  . Spironolactone Other (See Comments)    Significant AKI  . Rosuvastatin Muscle Pain    Other reaction(s): Muscle Pain    Medications:  Prior to Admission medications  Medication Dose, Route, Frequency  amlodipine (NORVASC) 10 MG tablet 10 mg, Oral, Daily (standard)  ascorbic acid, vitamin C, (VITAMIN C) 1000 MG tablet 1,000 mg  aspirin  81 mg cap 81 mg, Oral, Daily  atorvastatin (LIPITOR) 40 MG tablet 40 mg, Oral, Daily (standard)  calcium carbonate-vit D3-min 600 mg calcium- 400 unit Tab 1 tablet  calcium polycarbophil (FIBERCON) 625 mg tablet 625 mg, Nightly  carvedilol (COREG) 6.25 MG tablet 6.25 mg, Oral, 2 times a day with meals  cetirizine (ZYRTEC) 10 MG tablet 10 mg, Daily (standard)  cholecalciferol, vitamin D3, 2,000 unit cap 2,000 Units  clopidogrel (PLAVIX) 75 mg tablet 75 mg, Oral, Daily (standard)  cyanocobalamin 1000 MCG tablet 1,000 mcg  fluticasone (FLONASE)  50 mcg/actuation nasal spray 1 spray, Daily (standard)  folic acid 20 mg cap 1 tablet, Daily  pantoprazole (PROTONIX) 40 MG tablet 40 mg, Daily (standard)  pyridoxine, vitamin B6, (B-6) 100 MG tablet 100 mg  ramipril (ALTACE) 10 MG capsule 10 mg, Daily (standard)  trimethoprim  (TRIMPEX ) 100 mg tablet 100 mg, Daily  TURMERIC ROOT EXTRACT ORAL 1 tablet, Daily  isosorbide mononitrate (IMDUR) 30 MG 24 hr tablet 30 mg, Oral, Daily (standard)  nitroglycerin  (NITROSTAT ) 0.4 MG SL tablet PLACE ONE TABLET UNDER THE TONGUE EVERY 5 MINS AS NEEDED FOR CHEST PAIN. MAXIMUM OF 3 DOSES IN 15 MINUTES  ticagrelor (BRILINTA) 90 mg Tab  90 mg, Oral, 2 times a day (standard)     Objective:   Vitals BP 163/71   Pulse 62   Wt 67.9 kg (149 lb 9.6 oz)   SpO2 98%   BMI 25.68 kg/m    Wt Readings from Last 3 Encounters:  08/13/24 67.9 kg (149 lb 9.6 oz)  07/16/24 67 kg (147 lb 12.8 oz)  07/09/24 68.4 kg (150 lb 11.2 oz)    Physical Exam General:  Pleasant female sitting in chair in nad.  Neck: Supple, JVP normal.  Resp:   CTAB bilaterally with normal WOB.  Cardio:  RRR without m/r/g.  Abdomen:   Soft, non-distended, non-tender.  Extremities: Warm well-perfused bilaterally. No edema .  MSK: No joint swelling or erythema. No gross deformities.  Skin: No rashes  Neuro: CN II-XII grossly intact. Strength grossly intact.   Psych: Alert and oriented x3. Appropriate mood.    ECG (08/13/24) - independently interpreted.  Normal sinus rhythm, normal ECG  Most Recent Labs  Lab Results  Component Value Date   NA 142 07/16/2024   K 4.6 07/16/2024   CL 106 07/16/2024   CO2 22.0 07/16/2024   Lab Results  Component Value Date   BUN 15 07/16/2024   BUN 20 07/09/2024   Lab Results  Component Value Date   Creatinine 1.17 (H) 07/16/2024   Creatinine 1.44 (H) 07/09/2024   No results found for: PROBNP Lab Results  Component Value Date   Cholesterol, Total 125 07/16/2024   Triglycerides 171 (H) 07/16/2024   Cholesterol, HDL 36 (L) 07/16/2024   Cholesterol, Non-HDL, Calculated 89 07/16/2024   Cholesterol, LDL, Calculated 64 07/16/2024

## 2024-08-15 NOTE — Interval H&P Note (Signed)
 DAY OF SURGERY UPDATE   H&P reviewed. The patient was examined and there are no changes to the H&P.   BRIEF HPI The patient is an 83 y.o. F with PMH including HTN, HLD, NSTEMI with multivessel CAD s/p PCI to prox LAD and LM-prox LCx 12/20/2022, recent balloon angioplasty to the pLCx and LAD 07/16/2024 who presents for [facility] for evaluation of continued angina.  Since her cath last month and balloon angioplasty, she has continued to have significant fatigue, dyspnea, and chest pain on exertion.    LHC 07/16/2024: CONCLUSIONS: - Severe (90%) ostial LCx managed with successful PTCA of LCx in-stent restenosis with drug-coated balloon - After balloon angioplasty to the LCx, there was evidence of 70% ostial LAD stenosis managed with PTCA with [place] balloon only   SITE MARKING ATTESTATION  Site Marked: Yes   CONSENT FOR OPERATION OR PROCEDURE: PROVIDER CERTIFICATION  I hereby certify that the nature, purpose, benefits, usual and most frequent risks of, and alternatives to, the operation or procedure have been explained to the patient (or person authorized to sign for the patient) either by a physician or by the provider who is to perform the operation or procedure, that the patient has had an opportunity to ask questions, and that those questions have been answered. The patient or the patient's representative has been advised that selected tasks may be performed by assistants to the primary health care provider(s). I believe that the patient (or person authorized to sign for the patient) understands what has been explained, and has consented to the operation or procedure.  Vitals:   08/15/24 0940  BP: 131/48  Pulse: 56  Resp: 10  Temp: 36.9 C (98.4 F)  SpO2: 98%   Physical Exam: General: NAD, Well appearing Cardiac: RRR, no murmurs, rubs or gallops appreciated Pulmonary: CTAB Extremities: No LE edema appreciated   Ileana Daring, MD Cardiology Fellow; PGY-4 08/15/24

## 2024-08-23 ENCOUNTER — Encounter: Attending: Cardiology | Admitting: Emergency Medicine

## 2024-08-23 ENCOUNTER — Other Ambulatory Visit: Payer: Self-pay

## 2024-08-23 DIAGNOSIS — Z9861 Coronary angioplasty status: Secondary | ICD-10-CM

## 2024-08-23 NOTE — Progress Notes (Signed)
 Initial phone call completed. Diagnosis can be found in [facility] 8/18. EP Orientation scheduled for Thursday 08/30/24 at 0800. SABRA

## 2024-08-30 ENCOUNTER — Encounter: Attending: Cardiology

## 2024-08-30 VITALS — Ht 64.5 in | Wt 150.6 lb

## 2024-08-30 DIAGNOSIS — Z9861 Coronary angioplasty status: Secondary | ICD-10-CM | POA: Insufficient documentation

## 2024-08-30 DIAGNOSIS — Z955 Presence of coronary angioplasty implant and graft: Secondary | ICD-10-CM | POA: Diagnosis present

## 2024-08-30 DIAGNOSIS — I214 Non-ST elevation (NSTEMI) myocardial infarction: Secondary | ICD-10-CM | POA: Diagnosis present

## 2024-08-30 NOTE — Progress Notes (Signed)
 Cardiac Individual Treatment Plan  Patient Details  Name: Anne Ross MRN: [id number] Date of Birth: 1941/02/10 Referring Provider:   Flowsheet Row Cardiac Rehab from 08/30/2024 in [facility] Cardiac and Pulmonary Rehab  Referring Provider Dr. Margery Ruth, MD    Initial Encounter Date:  Flowsheet Row Cardiac Rehab from 08/30/2024 in [facility] Cardiac and Pulmonary Rehab  Date 08/30/24    Visit Diagnosis: History of percutaneous coronary intervention  Patient's Home Medications on Admission:  Current Outpatient Medications:    amLODipine (NORVASC) 10 MG tablet, Take 10 mg by mouth daily., Disp: , Rfl:    ascorbic acid (VITAMIN C) 1000 MG tablet, Take 1,000 mg by mouth daily., Disp: , Rfl:    Aspirin  81 MG CAPS, Take 81 mg by mouth daily., Disp: , Rfl:    atorvastatin (LIPITOR) 80 MG tablet, Take 1 tablet by mouth daily., Disp: , Rfl:    azithromycin  (ZITHROMAX ) 250 MG tablet, Take 1 tablet (250 mg total) by mouth daily. Take first 2 tablets together, then 1 every day until finished. (Patient not taking: Reported on 08/23/2024), Disp: 6 tablet, Rfl: 0   benzonatate  (TESSALON ) 200 MG capsule, Take 1 capsule (200 mg total) by mouth 3 (three) times daily as needed. (Patient not taking: Reported on 08/23/2024), Disp: 20 capsule, Rfl: 0   Calcipotriene 0.005 % solution, 1 Application as needed., Disp: , Rfl:    Calcium Carbonate-Vitamin D  500-125 MG-UNIT TABS, Take 1 tablet by mouth daily at 10 pm., Disp: , Rfl:    Calcium Polycarbophil (FIBER) 625 MG TABS, Take 1 tablet by mouth daily., Disp: , Rfl:    carvedilol (COREG) 6.25 MG tablet, Take 6.25 mg by mouth 2 (two) times daily. (Patient not taking: Reported on 08/23/2024), Disp: , Rfl:    cetirizine (ZYRTEC) 10 MG tablet, Take 10 mg by mouth daily., Disp: , Rfl:    chlorhexidine (PERIDEX) 0.12 % solution, Use as directed 15 mLs in the mouth or throat as needed. (Patient not taking: Reported on 08/23/2024), Disp: , Rfl:    Cholecalciferol (VITAMIN D )  2000 units tablet, Take 2,000 Units by mouth daily., Disp: , Rfl:    clopidogrel (PLAVIX) 75 MG tablet, Take 75 mg by mouth daily., Disp: , Rfl:    fluticasone (FLONASE) 50 MCG/ACT nasal spray, Place 1 spray into both nostrils daily., Disp: , Rfl:    folic acid (FOLVITE) 800 MCG tablet, Take 400 mcg by mouth daily., Disp: , Rfl:    isosorbide mononitrate (IMDUR) 30 MG 24 hr tablet, Take 30 mg by mouth daily., Disp: , Rfl:    metoprolol  succinate (TOPROL -XL) 50 MG 24 hr tablet, TAKE 1 TABLET(50 MG) BY MOUTH EVERY DAY, Disp: , Rfl:    nitroGLYCERIN  (NITROSTAT ) 0.4 MG SL tablet, Place 1 tablet under the tongue every 5 (five) minutes as needed for chest pain (up to 3 tab)., Disp: , Rfl:    pantoprazole (PROTONIX) 40 MG tablet, Take 40 mg by mouth daily., Disp: , Rfl:    pimecrolimus (ELIDEL) 1 % cream, Apply 1 Application topically as needed., Disp: , Rfl:    pyridoxine (B-6) 100 MG tablet, Take 100 mg by mouth daily., Disp: , Rfl:    ramipril (ALTACE) 10 MG capsule, Take 10 mg by mouth daily., Disp: , Rfl:    trimethoprim  (TRIMPEX ) 100 MG tablet, Take 1 tablet (100 mg total) by mouth daily., Disp: 90 tablet, Rfl: 3   TURMERIC PO, Take 1 capsule by mouth as needed., Disp: , Rfl:  vitamin B-12 (CYANOCOBALAMIN) 250 MCG tablet, Take 1,000 mcg by mouth daily., Disp: , Rfl:   Past Medical History: Past Medical History:  Diagnosis Date   Basal cell carcinoma    chest    Basal cell carcinoma 02/09/2024   left mid dorsal forearm - tx with ED&C   Cancer (HCC)    uterine   History of COVID-19    Hypertension     Tobacco Use: Social History   Tobacco Use  Smoking Status Never  Smokeless Tobacco Never    Labs: Review Flowsheet        No data to display           Exercise Target Goals: Exercise Program Goal: Individual exercise prescription set using results from initial 6 min walk test and THRR while considering  patient's activity barriers and safety.   Exercise Prescription  Goal: Initial exercise prescription builds to 30-45 minutes a day of aerobic activity, 2-3 days per week.  Home exercise guidelines will be given to patient during program as part of exercise prescription that the participant will acknowledge.   Education: Aerobic Exercise: - Group verbal and visual presentation on the components of exercise prescription. Introduces F.I.T.T principle from ACSM for exercise prescriptions.  Reviews F.I.T.T. principles of aerobic exercise including progression. Written material provided at class time. Flowsheet Row Cardiac Rehab from 08/30/2024 in [facility] Cardiac and Pulmonary Rehab  Education need identified 08/30/24    Education: Resistance Exercise: - Group verbal and visual presentation on the components of exercise prescription. Introduces F.I.T.T principle from ACSM for exercise prescriptions  Reviews F.I.T.T. principles of resistance exercise including progression. Written material provided at class time. Flowsheet Row Cardiac Rehab from 03/09/2023 in [facility] Cardiac and Pulmonary Rehab  Date 03/02/23  Educator KW  Instruction Review Code 1- [employer] Understanding     Education: Exercise & Equipment Safety: - Individual verbal instruction and demonstration of equipment use and safety with use of the equipment. Flowsheet Row Cardiac Rehab from 08/30/2024 in [facility] Cardiac and Pulmonary Rehab  Date 08/30/24  Educator NT  Instruction Review Code 1- Verbalizes Understanding    Education: Exercise Physiology & General Exercise Guidelines: - Group verbal and written instruction with models to review the exercise physiology of the cardiovascular system and associated critical values. Provides general exercise guidelines with specific guidelines to those with heart or lung disease. Written material provided at class time. Flowsheet Row Cardiac Rehab from 03/09/2023 in [facility] Cardiac and Pulmonary Rehab  Date 02/16/23  Educator [facility]  Instruction Review Code 1- Verbalizes  Understanding    Education: Flexibility, Balance, Mind/Body Relaxation: - Group verbal and visual presentation with interactive activity on the components of exercise prescription. Introduces F.I.T.T principle from ACSM for exercise prescriptions. Reviews F.I.T.T. principles of flexibility and balance exercise training including progression. Also discusses the mind body connection.  Reviews various relaxation techniques to help reduce and manage stress (i.e. Deep breathing, progressive muscle relaxation, and visualization). Balance handout provided to take home. Written material provided at class time. Flowsheet Row Cardiac Rehab from 03/09/2023 in [facility] Cardiac and Pulmonary Rehab  Date 03/02/23  Educator KW  Instruction Review Code 1- Verbalizes Understanding    Activity Barriers & Risk Stratification:  Activity Barriers & Cardiac Risk Stratification - 08/30/24 0920       Activity Barriers & Cardiac Risk Stratification   Activity Barriers Balance Concerns;Muscular Weakness;Arthritis;Other (comment);Fibromyalgia    Comments gout    Cardiac Risk Stratification Moderate  6 Minute Walk:  6 Minute Walk     Row Name 08/30/24 0919         6 Minute Walk   Phase Initial     Distance 950 feet     Walk Time 6 minutes     # of Rest Breaks 0     MPH 1.8     METS 1.98     RPE 13     Perceived Dyspnea  1     VO2 Peak 6.93     Symptoms No     Resting HR 88 bpm     Resting BP 132/52     Resting Oxygen Saturation  97 %     Exercise Oxygen Saturation  during 6 min walk 97 %     Max Ex. HR 107 bpm     Max Ex. BP 158/54     2 Minute Post BP 126/50        Oxygen Initial Assessment:   Oxygen Re-Evaluation:   Oxygen Discharge (Final Oxygen Re-Evaluation):   Initial Exercise Prescription:  Initial Exercise Prescription - 08/30/24 0900       Date of Initial Exercise RX and Referring Provider   Date 08/30/24    Referring Provider Dr. Margery Ruth, MD      Oxygen    Maintain Oxygen Saturation 88% or higher      Treadmill   MPH 1.8    Grade 0    Minutes 15    METs 2.38      Recumbant Bike   Level 2    RPM 50    Watts 15    Minutes 15    METs 1.98      NuStep   Level 2   T6 nustep   SPM 80    Minutes 15    METs 1.98      T5 Nustep   Level 1    SPM 80    Minutes 15    METs 1.98      Prescription Details   Frequency (times per week) 3    Duration Progress to 30 minutes of continuous aerobic without signs/symptoms of physical distress      Intensity   THRR 40-80% of Max Heartrate 108-128    Ratings of Perceived Exertion 11-13    Perceived Dyspnea 0-4      Progression   Progression Continue to progress workloads to maintain intensity without signs/symptoms of physical distress.      Resistance Training   Training Prescription Yes    Weight 2 lb    Reps 10-15          Perform Capillary Blood Glucose checks as needed.  Exercise Prescription Changes:   Exercise Prescription Changes     Row Name 08/30/24 0900             Response to Exercise   Blood Pressure (Admit) 132/52       Blood Pressure (Exercise) 158/54       Blood Pressure (Exit) 126/50       Heart Rate (Admit) 88 bpm       Heart Rate (Exercise) 107 bpm       Heart Rate (Exit) 90 bpm       Oxygen Saturation (Admit) 97 %       Oxygen Saturation (Exercise) 97 %       Rating of Perceived Exertion (Exercise) 13       Perceived Dyspnea (Exercise) 1  Symptoms none       Comments Results          Exercise Comments:   Exercise Goals and Review:   Exercise Goals     Row Name 08/30/24 0921             Exercise Goals   Increase Physical Activity Yes       Intervention Provide advice, education, support and counseling about physical activity/exercise needs.;Develop an individualized exercise prescription for aerobic and resistive training based on initial evaluation findings, risk stratification, comorbidities and participant's personal  goals.       Expected Outcomes Short Term: Attend rehab on a regular basis to increase amount of physical activity.;Long Term: Add in home exercise to make exercise part of routine and to increase amount of physical activity.;Long Term: Exercising regularly at least 3-5 days a week.       Able to understand and use rate of perceived exertion (RPE) scale Yes       Intervention Provide education and explanation on how to use RPE scale       Expected Outcomes Short Term: Able to use RPE daily in rehab to express subjective intensity level;Long Term:  Able to use RPE to guide intensity level when exercising independently       Able to understand and use Dyspnea scale Yes       Intervention Provide education and explanation on how to use Dyspnea scale       Expected Outcomes Short Term: Able to use Dyspnea scale daily in rehab to express subjective sense of shortness of breath during exertion;Long Term: Able to use Dyspnea scale to guide intensity level when exercising independently       Knowledge and understanding of Target Heart Rate Range (THRR) Yes       Intervention Provide education and explanation of THRR including how the numbers were predicted and where they are located for reference       Expected Outcomes Short Term: Able to state/look up THRR;Short Term: Able to use daily as guideline for intensity in rehab;Long Term: Able to use THRR to govern intensity when exercising independently       Able to check pulse independently Yes       Intervention Provide education and demonstration on how to check pulse in carotid and radial arteries.;Review the importance of being able to check your own pulse for safety during independent exercise       Expected Outcomes Short Term: Able to explain why pulse checking is important during independent exercise;Long Term: Able to check pulse independently and accurately       Understanding of Exercise Prescription Yes       Intervention Provide education,  explanation, and written materials on patient's individual exercise prescription       Expected Outcomes Short Term: Able to explain program exercise prescription;Long Term: Able to explain home exercise prescription to exercise independently          Exercise Goals Re-Evaluation :   Discharge Exercise Prescription (Final Exercise Prescription Changes):  Exercise Prescription Changes - 08/30/24 0900       Response to Exercise   Blood Pressure (Admit) 132/52    Blood Pressure (Exercise) 158/54    Blood Pressure (Exit) 126/50    Heart Rate (Admit) 88 bpm    Heart Rate (Exercise) 107 bpm    Heart Rate (Exit) 90 bpm    Oxygen Saturation (Admit) 97 %    Oxygen Saturation (Exercise) 97 %  Rating of Perceived Exertion (Exercise) 13    Perceived Dyspnea (Exercise) 1    Symptoms none    Comments Results          Nutrition:  Target Goals: Understanding of nutrition guidelines, daily intake of sodium 1500mg , cholesterol 200mg , calories 30% from fat and 7% or less from saturated fats, daily to have 5 or more servings of fruits and vegetables.  Education: Nutrition 1 -Group instruction provided by verbal, written material, interactive activities, discussions, models, and posters to present general guidelines for heart healthy nutrition including macronutrients, label reading, and promoting whole foods over processed counterparts. Education serves as Pensions consultant of discussion of heart healthy eating for all. Written material provided at class time.    Education: Nutrition 2 -Group instruction provided by verbal, written material, interactive activities, discussions, models, and posters to present general guidelines for heart healthy nutrition including sodium, cholesterol, and saturated fat. Providing guidance of habit forming to improve blood pressure, cholesterol, and body weight. Written material provided at class time.     Biometrics:  Pre Biometrics - 08/30/24 0921        Pre Biometrics   Height 5' 4.5 (1.638 m)    Weight 150 lb 9.6 oz (68.3 kg)    Waist Circumference 34 inches    Hip Circumference 39 inches    Waist to Hip Ratio 0.87 %    BMI (Calculated) 25.46    Single Leg Stand 10.15 seconds           Nutrition Therapy Plan and Nutrition Goals:  Nutrition Therapy & Goals - 08/30/24 0918       Nutrition Therapy   RD appointment deferred Yes      Intervention Plan   Intervention Prescribe, educate and counsel regarding individualized specific dietary modifications aiming towards targeted core components such as weight, hypertension, lipid management, diabetes, heart failure and other comorbidities.    Expected Outcomes Short Term Goal: Understand basic principles of dietary content, such as calories, fat, sodium, cholesterol and nutrients.;Short Term Goal: A plan has been developed with personal nutrition goals set during dietitian appointment.;Long Term Goal: Adherence to prescribed nutrition plan.          Nutrition Assessments:  MEDIFICTS Score Key: >=70 Need to make dietary changes  40-70 Heart Healthy Diet <= 40 Therapeutic Level Cholesterol Diet  Flowsheet Row Cardiac Rehab from 08/30/2024 in [facility] Cardiac and Pulmonary Rehab  Picture Your Plate Total Score on Admission 68   Picture Your Plate Scores: <59 Unhealthy dietary pattern with much room for improvement. 41-50 Dietary pattern unlikely to meet recommendations for good health and room for improvement. 51-60 More healthful dietary pattern, with some room for improvement.  >60 Healthy dietary pattern, although there may be some specific behaviors that could be improved.    Nutrition Goals Re-Evaluation:   Nutrition Goals Discharge (Final Nutrition Goals Re-Evaluation):   Psychosocial: Target Goals: Acknowledge presence or absence of significant depression and/or stress, maximize coping skills, provide positive support system. Participant is able to verbalize types and  ability to use techniques and skills needed for reducing stress and depression.   Education: Stress, Anxiety, and Depression - Group verbal and visual presentation to define topics covered.  Reviews how body is impacted by stress, anxiety, and depression.  Also discusses healthy ways to reduce stress and to treat/manage anxiety and depression. Written material provided at class time.   Education: Sleep Hygiene -Provides group verbal and written instruction about how sleep can affect your health.  Define sleep hygiene, discuss sleep cycles and impact of sleep habits. Review good sleep hygiene tips.   Initial Review & Psychosocial Screening:  Initial Psych Review & Screening - 08/23/24 1545       Initial Review   Current issues with None Identified      Family Dynamics   Good Support System? Yes   husband and daughter     Barriers   Psychosocial barriers to participate in program There are no identifiable barriers or psychosocial needs.;The patient should benefit from training in stress management and relaxation.      Screening Interventions   Interventions Encouraged to exercise;To provide support and resources with identified psychosocial needs    Expected Outcomes Short Term goal: Utilizing psychosocial counselor, staff and physician to assist with identification of specific Stressors or current issues interfering with healing process. Setting desired goal for each stressor or current issue identified.;Long Term Goal: Stressors or current issues are controlled or eliminated.;Short Term goal: Identification and review with participant of any Quality of Life or Depression concerns found by scoring the questionnaire.;Long Term goal: The participant improves quality of Life and PHQ9 Scores as seen by post scores and/or verbalization of changes          Quality of Life Scores:   Quality of Life - 08/30/24 0917       Quality of Life   Select Quality of Life      Quality of Life  Scores   Health/Function Pre 24.5 %    Socioeconomic Pre 26.25 %    Psych/Spiritual Pre 30 %    Family Pre 28.8 %    GLOBAL Pre 26.61 %         Scores of 19 and below usually indicate a poorer quality of life in these areas.  A difference of  2-3 points is a clinically meaningful difference.  A difference of 2-3 points in the total score of the Quality of Life Index has been associated with significant improvement in overall quality of life, self-image, physical symptoms, and general health in studies assessing change in quality of life.  PHQ-9: Review Flowsheet       08/30/2024 03/11/2023 02/04/2023 01/10/2023  Depression screen PHQ 2/9  Decreased Interest 1 0 0 0  Down, Depressed, Hopeless 1 0 0 0  PHQ - 2 Score 2 0 0 0  Altered sleeping 0 0 0 0  Tired, decreased energy 3 1 1 3   Change in appetite 1 0 0 2  Feeling bad or failure about yourself  0 0 0 0  Trouble concentrating 0 0 0 0  Moving slowly or fidgety/restless 1 0 0 2  Suicidal thoughts 0 0 0 0  PHQ-9 Score 7 1 1 7   Difficult doing work/chores Somewhat difficult Not difficult at all Not difficult at all Somewhat difficult   Interpretation of Total Score  Total Score Depression Severity:  1-4 = Minimal depression, 5-9 = Mild depression, 10-14 = Moderate depression, 15-19 = Moderately severe depression, 20-27 = Severe depression   Psychosocial Evaluation and Intervention:  Psychosocial Evaluation - 08/23/24 1548       Psychosocial Evaluation & Interventions   Interventions Encouraged to exercise with the program and follow exercise prescription    Comments Marjon attended the program in 2024. She had a cardiac cath 8/18 due to angina and had a follow up cath in 9/17 due to continued angina on exertion. No new stents were placed, however she did start Imdur and stop carvedilol which  seems to have decreased her pain. Maliyah is retired and lives at home with her husband. Her daughter is also staying with her during the day  as she reports continued general weakness and difficulty completing ADLs.    Expected Outcomes Short: Attend cardiac and pulmonary rehab for education and exercise. Long: Develop and maintain positive self care habits.    Continue Psychosocial Services  Follow up required by staff          Psychosocial Re-Evaluation:   Psychosocial Discharge (Final Psychosocial Re-Evaluation):   Vocational Rehabilitation: Provide vocational rehab assistance to qualifying candidates.   Vocational Rehab Evaluation & Intervention:  Vocational Rehab - 08/23/24 1544       Initial Vocational Rehab Evaluation & Intervention   Assessment shows need for Vocational Rehabilitation No      Vocational Rehab Re-Evaulation   Comments retired          Education: Education Goals: Education classes will be provided on a variety of topics geared toward better understanding of heart health and risk factor modification. Participant will state understanding/return demonstration of topics presented as noted by education test scores.  Learning Barriers/Preferences:  Learning Barriers/Preferences - 08/23/24 1544       Learning Barriers/Preferences   Learning Barriers None    Learning Preferences None          General Cardiac Education Topics:  AED/CPR: - Group verbal and written instruction with the use of models to demonstrate the basic use of the AED with the basic ABC's of resuscitation.   Test and Procedures: - Group verbal and visual presentation and models provide information about basic cardiac anatomy and function. Reviews the testing methods done to diagnose heart disease and the outcomes of the test results. Describes the treatment choices: Medical Management, Angioplasty, or Coronary Bypass Surgery for treating various heart conditions including Myocardial Infarction, Angina, Valve Disease, and Cardiac Arrhythmias. Written material provided at class time.   Medication Safety: - Group verbal  and visual instruction to review commonly prescribed medications for heart and lung disease. Reviews the medication, class of the drug, and side effects. Includes the steps to properly store meds and maintain the prescription regimen. Written material provided at class time. Flowsheet Row Cardiac Rehab from 03/09/2023 in [facility] Cardiac and Pulmonary Rehab  Date 01/19/23  Educator MS  Instruction Review Code 1- Verbalizes Understanding    Intimacy: - Group verbal instruction through game format to discuss how heart and lung disease can affect sexual intimacy. Written material provided at class time. Flowsheet Row Cardiac Rehab from 03/09/2023 in [facility] Cardiac and Pulmonary Rehab  Date 02/23/23  Educator [facility]  Instruction Review Code 1- Verbalizes Understanding    Know Your Numbers and Heart Failure: - Group verbal and visual instruction to discuss disease risk factors for cardiac and pulmonary disease and treatment options.  Reviews associated critical values for Overweight/Obesity, Hypertension, Cholesterol, and Diabetes.  Discusses basics of heart failure: signs/symptoms and treatments.  Introduces Heart Failure Zone chart for action plan for heart failure. Written material provided at class time. Flowsheet Row Cardiac Rehab from 03/09/2023 in [facility] Cardiac and Pulmonary Rehab  Date 01/26/23  Educator [facility]  Instruction Review Code 1- Verbalizes Understanding    Infection Prevention: - Provides verbal and written material to individual with discussion of infection control including proper hand washing and proper equipment cleaning during exercise session. Flowsheet Row Cardiac Rehab from 08/30/2024 in [facility] Cardiac and Pulmonary Rehab  Date 08/30/24  Educator NT  Instruction Review Code 1-  Verbalizes Understanding    Falls Prevention: - Provides verbal and written material to individual with discussion of falls prevention and safety. Flowsheet Row Cardiac Rehab from 08/30/2024 in [facility] Cardiac and  Pulmonary Rehab  Date 08/30/24  Educator NT  Instruction Review Code 1- Verbalizes Understanding    Other: -Provides group and verbal instruction on various topics (see comments) Flowsheet Row Cardiac Rehab from 03/09/2023 in [facility] Cardiac and Pulmonary Rehab  Date 01/12/23  Educator SB  Instruction Review Code 1- Verbalizes Understanding    Knowledge Questionnaire Score:  Knowledge Questionnaire Score - 08/30/24 9082       Knowledge Questionnaire Score   Pre Score 24/26          Core Components/Risk Factors/Patient Goals at Admission:  Personal Goals and Risk Factors at Admission - 08/23/24 1543       Core Components/Risk Factors/Patient Goals on Admission    Weight Management Weight Maintenance;Yes    Intervention Weight Management: Develop a combined nutrition and exercise program designed to reach desired caloric intake, while maintaining appropriate intake of nutrient and fiber, sodium and fats, and appropriate energy expenditure required for the weight goal.;Weight Management: Provide education and appropriate resources to help participant work on and attain dietary goals.    Expected Outcomes Short Term: Continue to assess and modify interventions until short term weight is achieved;Long Term: Adherence to nutrition and physical activity/exercise program aimed toward attainment of established weight goal;Understanding recommendations for meals to include 15-35% energy as protein, 25-35% energy from fat, 35-60% energy from carbohydrates, less than 200mg  of dietary cholesterol, 20-35 gm of total fiber daily;Understanding of distribution of calorie intake throughout the day with the consumption of 4-5 meals/snacks;Weight Maintenance: Understanding of the daily nutrition guidelines, which includes 25-35% calories from fat, 7% or less cal from saturated fats, less than 200mg  cholesterol, less than 1.5gm of sodium, & 5 or more servings of fruits and vegetables daily    Hypertension  Yes    Intervention Provide education on lifestyle modifcations including regular physical activity/exercise, weight management, moderate sodium restriction and increased consumption of fresh fruit, vegetables, and low fat dairy, alcohol moderation, and smoking cessation.;Monitor prescription use compliance.    Expected Outcomes Short Term: Continued assessment and intervention until BP is < 140/47mm HG in hypertensive participants. < 130/34mm HG in hypertensive participants with diabetes, heart failure or chronic kidney disease.;Long Term: Maintenance of blood pressure at goal levels.    Lipids Yes    Intervention Provide education and support for participant on nutrition & aerobic/resistive exercise along with prescribed medications to achieve LDL 70mg , HDL >40mg .    Expected Outcomes Short Term: Participant states understanding of desired cholesterol values and is compliant with medications prescribed. Participant is following exercise prescription and nutrition guidelines.;Long Term: Cholesterol controlled with medications as prescribed, with individualized exercise RX and with personalized nutrition plan. Value goals: LDL < 70mg , HDL > 40 mg.          Education:Diabetes - Individual verbal and written instruction to review signs/symptoms of diabetes, desired ranges of glucose level fasting, after meals and with exercise. Acknowledge that pre and post exercise glucose checks will be done for 3 sessions at entry of program.   Core Components/Risk Factors/Patient Goals Review:    Core Components/Risk Factors/Patient Goals at Discharge (Final Review):    ITP Comments:  ITP Comments     Row Name 08/23/24 1556 08/30/24 0915         ITP Comments Initial phone call completed. Diagnosis can be  found in [facility] 8/18. EP Orientation scheduled for Thursday 08/30/24 at 0800. SABRA Completed and gym orientation for cardiac rehab. Initial ITP created and sent for review to Dr. Oneil Pinal, Medical  Director.         Comments: Initial ITP

## 2024-08-30 NOTE — Patient Instructions (Signed)
 Patient Instructions  Patient Details  Name: Anne Ross MRN: [id number] Date of Birth: [birth year] Referring Provider:  Jama Margery ORN, MD  Below are your personal goals for exercise, nutrition, and risk factors. Our goal is to help you stay on track towards obtaining and maintaining these goals. We will be discussing your progress on these goals with you throughout the program.  Initial Exercise Prescription:  Initial Exercise Prescription - 08/30/24 0900       Date of Initial Exercise RX and Referring Provider   Date 08/30/24    Referring Provider Dr. Margery Jama, MD      Oxygen   Maintain Oxygen Saturation 88% or higher      Treadmill   MPH 1.8    Grade 0    Minutes 15    METs 2.38      Recumbant Bike   Level 2    RPM 50    Watts 15    Minutes 15    METs 1.98      NuStep   Level 2   T6 nustep   SPM 80    Minutes 15    METs 1.98      T5 Nustep   Level 1    SPM 80    Minutes 15    METs 1.98      Prescription Details   Frequency (times per week) 3    Duration Progress to 30 minutes of continuous aerobic without signs/symptoms of physical distress      Intensity   THRR 40-80% of Max Heartrate 108-128    Ratings of Perceived Exertion 11-13    Perceived Dyspnea 0-4      Progression   Progression Continue to progress workloads to maintain intensity without signs/symptoms of physical distress.      Resistance Training   Training Prescription Yes    Weight 2 lb    Reps 10-15          Exercise Goals: Frequency: Be able to perform aerobic exercise two to three times per week in program working toward 2-5 days per week of home exercise.  Intensity: Work with a perceived exertion of 11 (fairly light) - 15 (hard) while following your exercise prescription.  We will make changes to your prescription with you as you progress through the program.   Duration: Be able to do 30 to 45 minutes of continuous aerobic exercise in addition to a 5 minute warm-up  and a 5 minute cool-down routine.   Nutrition Goals: Your personal nutrition goals will be established when you do your nutrition analysis with the dietician.  The following are general nutrition guidelines to follow: Cholesterol < 200mg /day Sodium < 1500mg /day Fiber: Women over 50 yrs - 21 grams per day  Personal Goals:  Personal Goals and Risk Factors at Admission - 08/23/24 1543       Core Components/Risk Factors/Patient Goals on Admission    Weight Management Weight Maintenance;Yes    Intervention Weight Management: Develop a combined nutrition and exercise program designed to reach desired caloric intake, while maintaining appropriate intake of nutrient and fiber, sodium and fats, and appropriate energy expenditure required for the weight goal.;Weight Management: Provide education and appropriate resources to help participant work on and attain dietary goals.    Expected Outcomes Short Term: Continue to assess and modify interventions until short term weight is achieved;Long Term: Adherence to nutrition and physical activity/exercise program aimed toward attainment of established weight goal;Understanding recommendations for meals to include 15-35% energy  as protein, 25-35% energy from fat, 35-60% energy from carbohydrates, less than 200mg  of dietary cholesterol, 20-35 gm of total fiber daily;Understanding of distribution of calorie intake throughout the day with the consumption of 4-5 meals/snacks;Weight Maintenance: Understanding of the daily nutrition guidelines, which includes 25-35% calories from fat, 7% or less cal from saturated fats, less than 200mg  cholesterol, less than 1.5gm of sodium, & 5 or more servings of fruits and vegetables daily    Hypertension Yes    Intervention Provide education on lifestyle modifcations including regular physical activity/exercise, weight management, moderate sodium restriction and increased consumption of fresh fruit, vegetables, and low fat dairy,  alcohol moderation, and smoking cessation.;Monitor prescription use compliance.    Expected Outcomes Short Term: Continued assessment and intervention until BP is < 140/36mm HG in hypertensive participants. < 130/31mm HG in hypertensive participants with diabetes, heart failure or chronic kidney disease.;Long Term: Maintenance of blood pressure at goal levels.    Lipids Yes    Intervention Provide education and support for participant on nutrition & aerobic/resistive exercise along with prescribed medications to achieve LDL 70mg , HDL >40mg .    Expected Outcomes Short Term: Participant states understanding of desired cholesterol values and is compliant with medications prescribed. Participant is following exercise prescription and nutrition guidelines.;Long Term: Cholesterol controlled with medications as prescribed, with individualized exercise RX and with personalized nutrition plan. Value goals: LDL < 70mg , HDL > 40 mg.         Exercise Goals and Review:  Exercise Goals     Row Name 08/30/24 0921             Exercise Goals   Increase Physical Activity Yes       Intervention Provide advice, education, support and counseling about physical activity/exercise needs.;Develop an individualized exercise prescription for aerobic and resistive training based on initial evaluation findings, risk stratification, comorbidities and participant's personal goals.       Expected Outcomes Short Term: Attend rehab on a regular basis to increase amount of physical activity.;Long Term: Add in home exercise to make exercise part of routine and to increase amount of physical activity.;Long Term: Exercising regularly at least 3-5 days a week.       Able to understand and use rate of perceived exertion (RPE) scale Yes       Intervention Provide education and explanation on how to use RPE scale       Expected Outcomes Short Term: Able to use RPE daily in rehab to express subjective intensity level;Long Term:  Able  to use RPE to guide intensity level when exercising independently       Able to understand and use Dyspnea scale Yes       Intervention Provide education and explanation on how to use Dyspnea scale       Expected Outcomes Short Term: Able to use Dyspnea scale daily in rehab to express subjective sense of shortness of breath during exertion;Long Term: Able to use Dyspnea scale to guide intensity level when exercising independently       Knowledge and understanding of Target Heart Rate Range (THRR) Yes       Intervention Provide education and explanation of THRR including how the numbers were predicted and where they are located for reference       Expected Outcomes Short Term: Able to state/look up THRR;Short Term: Able to use daily as guideline for intensity in rehab;Long Term: Able to use THRR to govern intensity when exercising independently  Able to check pulse independently Yes       Intervention Provide education and demonstration on how to check pulse in carotid and radial arteries.;Review the importance of being able to check your own pulse for safety during independent exercise       Expected Outcomes Short Term: Able to explain why pulse checking is important during independent exercise;Long Term: Able to check pulse independently and accurately       Understanding of Exercise Prescription Yes       Intervention Provide education, explanation, and written materials on patient's individual exercise prescription       Expected Outcomes Short Term: Able to explain program exercise prescription;Long Term: Able to explain home exercise prescription to exercise independently

## 2024-09-03 ENCOUNTER — Encounter

## 2024-09-03 DIAGNOSIS — Z9861 Coronary angioplasty status: Secondary | ICD-10-CM

## 2024-09-03 DIAGNOSIS — Z955 Presence of coronary angioplasty implant and graft: Secondary | ICD-10-CM

## 2024-09-03 NOTE — Progress Notes (Signed)
 Daily Session Note  Patient Details  Name: Anne Ross MRN: [id number] Date of Birth: [birth year] Referring Provider:   Flowsheet Row Cardiac Rehab from 08/30/2024 in [facility] Cardiac and Pulmonary Rehab  Referring Provider Dr. Margery Ruth, MD    Encounter Date: 09/03/2024  Check In:  Session Check In - 09/03/24 0922       Check-In   Supervising physician immediately available to respond to emergencies See telemetry face sheet for immediately available ER MD    Location [facility]-Cardiac & Pulmonary Rehab    Staff Present Burnard Davenport RN,BSN,MPA;Joseph [facility] RCP,RRT,BSRT;Maxon Burnell BS, Exercise Physiologist;Sahiti Joswick Dyane BS, ACSM CEP, Exercise Physiologist    Virtual Visit No    Medication changes reported     No    Fall or balance concerns reported    No    Tobacco Cessation No Change    Warm-up and Cool-down Performed on first and last piece of equipment    Resistance Training Performed Yes    VAD Patient? No    PAD/SET Patient? No      Pain Assessment   Currently in Pain? No/denies             Social History   Tobacco Use  Smoking Status Never  Smokeless Tobacco Never    Goals Met:  Independence with exercise equipment Exercise tolerated well No report of concerns or symptoms today Strength training completed today  Goals Unmet:  Not Applicable  Comments: First full day of exercise!  Patient was oriented to gym and equipment including functions, settings, policies, and procedures.  Patient's individual exercise prescription and treatment plan were reviewed.  All starting workloads were established based on the results of the 6 minute walk test done at initial orientation visit.  The plan for exercise progression was also introduced and progression will be customized based on patient's performance and goals.    Dr. Oneil Pinal is Medical Director for [facility] Cardiac Rehabilitation.  Dr. Fuad Aleskerov is Medical Director for [facility] Pulmonary  Rehabilitation.

## 2024-09-05 ENCOUNTER — Encounter

## 2024-09-05 DIAGNOSIS — Z9861 Coronary angioplasty status: Secondary | ICD-10-CM | POA: Diagnosis not present

## 2024-09-05 DIAGNOSIS — Z955 Presence of coronary angioplasty implant and graft: Secondary | ICD-10-CM

## 2024-09-05 NOTE — Progress Notes (Signed)
 Daily Session Note  Patient Details  Name: Anne Ross MRN: [id number] Date of Birth: Apr 08, 1941 Referring Provider:   Flowsheet Row Cardiac Rehab from 08/30/2024 in [facility] Cardiac and Pulmonary Rehab  Referring Provider Dr. Margery Ruth, MD    Encounter Date: 09/05/2024  Check In:  Session Check In - 09/05/24 0949       Check-In   Supervising physician immediately available to respond to emergencies See telemetry face sheet for immediately available ER MD    Location [facility]-Cardiac & Pulmonary Rehab    Staff Present Burnard Davenport [facility] Peggi, RN, DNP, NE-BC;Laura Cates RN,BSN;Joseph [facility] Best, MS, Exercise Physiologist;Genesia Caslin Dyane BS, ACSM CEP, Exercise Physiologist    Virtual Visit No    Medication changes reported     No    Fall or balance concerns reported    No    Tobacco Cessation No Change    Warm-up and Cool-down Performed on first and last piece of equipment    Resistance Training Performed Yes    VAD Patient? No    PAD/SET Patient? No      Pain Assessment   Currently in Pain? No/denies             Social History   Tobacco Use  Smoking Status Never  Smokeless Tobacco Never    Goals Met:  Independence with exercise equipment Exercise tolerated well No report of concerns or symptoms today Strength training completed today  Goals Unmet:  Not Applicable  Comments: Pt able to follow exercise prescription today without complaint.  Will continue to monitor for progression.    Dr. Oneil Pinal is Medical Director for [facility] Cardiac Rehabilitation.  Dr. Fuad Aleskerov is Medical Director for [facility] Pulmonary Rehabilitation.

## 2024-09-07 ENCOUNTER — Encounter

## 2024-09-07 DIAGNOSIS — Z9861 Coronary angioplasty status: Secondary | ICD-10-CM | POA: Diagnosis not present

## 2024-09-07 DIAGNOSIS — I214 Non-ST elevation (NSTEMI) myocardial infarction: Secondary | ICD-10-CM

## 2024-09-07 DIAGNOSIS — Z955 Presence of coronary angioplasty implant and graft: Secondary | ICD-10-CM

## 2024-09-07 NOTE — Progress Notes (Signed)
 Daily Session Note  Patient Details  Name: Anne Ross MRN: [id number] Date of Birth: 03/05/1941 Referring Provider:   Flowsheet Row Cardiac Rehab from 08/30/2024 in [facility] Cardiac and Pulmonary Rehab  Referring Provider Dr. Margery Ruth, MD    Encounter Date: 09/07/2024  Check In:  Session Check In - 09/07/24 0928       Check-In   Supervising physician immediately available to respond to emergencies See telemetry face sheet for immediately available ER MD    Location [facility]-Cardiac & Pulmonary Rehab    Staff Present Ronal Picket, RN, DNP, NE-BC;Noah Tickle, BS, Exercise Physiologist;Dashawna Delbridge RN,BSN;Maxon Conetta BS, Exercise Physiologist    Virtual Visit No    Medication changes reported     No    Fall or balance concerns reported    No    Warm-up and Cool-down Performed on first and last piece of equipment    Resistance Training Performed Yes    VAD Patient? No    PAD/SET Patient? No      Pain Assessment   Currently in Pain? No/denies             Social History   Tobacco Use  Smoking Status Never  Smokeless Tobacco Never    Goals Met:  Independence with exercise equipment Exercise tolerated well No report of concerns or symptoms today Strength training completed today  Goals Unmet:  Not Applicable  Comments: Pt able to follow exercise prescription today without complaint.  Will continue to monitor for progression.    Dr. Oneil Pinal is Medical Director for [facility] Cardiac Rehabilitation.  Dr. Fuad Aleskerov is Medical Director for [facility] Pulmonary Rehabilitation.

## 2024-09-10 ENCOUNTER — Encounter

## 2024-09-10 DIAGNOSIS — Z955 Presence of coronary angioplasty implant and graft: Secondary | ICD-10-CM

## 2024-09-10 DIAGNOSIS — Z9861 Coronary angioplasty status: Secondary | ICD-10-CM | POA: Diagnosis not present

## 2024-09-10 NOTE — Progress Notes (Signed)
 Daily Session Note  Patient Details  Name: Anne Ross MRN: [id number] Date of Birth: February 20, 1941 Referring Provider:   Flowsheet Row Cardiac Rehab from 08/30/2024 in [facility] Cardiac and Pulmonary Rehab  Referring Provider Dr. Margery Ruth, MD    Encounter Date: 09/10/2024  Check In:  Session Check In - 09/10/24 0918       Check-In   Supervising physician immediately available to respond to emergencies See telemetry face sheet for immediately available ER MD    Location [facility]-Cardiac & Pulmonary Rehab    Staff Present Burnard Davenport RN,BSN,MPA;Joseph [facility] RCP,RRT,BSRT;Maxon Burnell BS, Exercise Physiologist;Taishawn Smaldone Dyane BS, ACSM CEP, Exercise Physiologist    Virtual Visit No    Medication changes reported     No    Fall or balance concerns reported    No    Tobacco Cessation No Change    Warm-up and Cool-down Performed on first and last piece of equipment    Resistance Training Performed Yes    VAD Patient? No    PAD/SET Patient? No      Pain Assessment   Currently in Pain? No/denies             Social History   Tobacco Use  Smoking Status Never  Smokeless Tobacco Never    Goals Met:  Independence with exercise equipment Exercise tolerated well No report of concerns or symptoms today Strength training completed today  Goals Unmet:  Not Applicable  Comments: Pt able to follow exercise prescription today without complaint.  Will continue to monitor for progression.    Dr. Oneil Pinal is Medical Director for [facility] Cardiac Rehabilitation.  Dr. Fuad Aleskerov is Medical Director for [facility] Pulmonary Rehabilitation.

## 2024-09-12 ENCOUNTER — Encounter

## 2024-09-12 DIAGNOSIS — Z9861 Coronary angioplasty status: Secondary | ICD-10-CM

## 2024-09-12 DIAGNOSIS — Z955 Presence of coronary angioplasty implant and graft: Secondary | ICD-10-CM

## 2024-09-12 NOTE — Progress Notes (Signed)
 Daily Session Note  Patient Details  Name: Anne Ross MRN: [id number] Date of Birth: February 10, 1941 Referring Provider:   Flowsheet Row Cardiac Rehab from 08/30/2024 in [facility] Cardiac and Pulmonary Rehab  Referring Provider Dr. Margery Ruth, MD    Encounter Date: 09/12/2024  Check In:  Session Check In - 09/12/24 0922       Check-In   Supervising physician immediately available to respond to emergencies See telemetry face sheet for immediately available ER MD    Location [facility]-Cardiac & Pulmonary Rehab    Staff Present Burnard Davenport RN,BSN,MPA;Joseph [facility] Dyane BS, ACSM CEP, Exercise Physiologist;Noah Tickle, BS, Exercise Physiologist    Virtual Visit No    Medication changes reported     No    Fall or balance concerns reported    No    Tobacco Cessation No Change    Warm-up and Cool-down Performed on first and last piece of equipment    Resistance Training Performed Yes    VAD Patient? No    PAD/SET Patient? No      Pain Assessment   Currently in Pain? No/denies             Social History   Tobacco Use  Smoking Status Never  Smokeless Tobacco Never    Goals Met:  Independence with exercise equipment Exercise tolerated well No report of concerns or symptoms today Strength training completed today  Goals Unmet:  Not Applicable  Comments: Pt able to follow exercise prescription today without complaint.  Will continue to monitor for progression.    Dr. Oneil Pinal is Medical Director for [facility] Cardiac Rehabilitation.  Dr. Fuad Aleskerov is Medical Director for [facility] Pulmonary Rehabilitation.

## 2024-09-14 ENCOUNTER — Encounter

## 2024-09-14 DIAGNOSIS — Z9861 Coronary angioplasty status: Secondary | ICD-10-CM

## 2024-09-14 DIAGNOSIS — Z955 Presence of coronary angioplasty implant and graft: Secondary | ICD-10-CM

## 2024-09-14 NOTE — Progress Notes (Signed)
 Daily Session Note  Patient Details  Name: Anne Ross MRN: [id number] Date of Birth: Aug 29, 1941 Referring Provider:   Flowsheet Row Cardiac Rehab from 08/30/2024 in [facility] Cardiac and Pulmonary Rehab  Referring Provider Dr. Margery Ruth, MD    Encounter Date: 09/14/2024  Check In:  Session Check In - 09/14/24 0923       Check-In   Supervising physician immediately available to respond to emergencies See telemetry face sheet for immediately available ER MD    Location [facility]-Cardiac & Pulmonary Rehab    Staff Present Burnard Davenport RN,BSN,MPA;Maxon Conetta BS, Exercise Physiologist;Noah Tickle, BS, Exercise Physiologist;Laureen Delores, BS, RRT, CPFT    Virtual Visit No    Medication changes reported     No    Fall or balance concerns reported    No    Tobacco Cessation No Change    Warm-up and Cool-down Performed on first and last piece of equipment    Resistance Training Performed Yes    VAD Patient? No    PAD/SET Patient? No      Pain Assessment   Currently in Pain? No/denies             Social History   Tobacco Use  Smoking Status Never  Smokeless Tobacco Never    Goals Met:  Independence with exercise equipment Exercise tolerated well No report of concerns or symptoms today Strength training completed today  Goals Unmet:  Not Applicable  Comments: Pt able to follow exercise prescription today without complaint.  Will continue to monitor for progression.    Dr. Oneil Pinal is Medical Director for [facility] Cardiac Rehabilitation.  Dr. Fuad Aleskerov is Medical Director for [facility] Pulmonary Rehabilitation.

## 2024-09-17 ENCOUNTER — Encounter

## 2024-09-17 DIAGNOSIS — Z9861 Coronary angioplasty status: Secondary | ICD-10-CM | POA: Diagnosis not present

## 2024-09-17 DIAGNOSIS — Z955 Presence of coronary angioplasty implant and graft: Secondary | ICD-10-CM

## 2024-09-17 NOTE — Progress Notes (Signed)
 Daily Session Note  Patient Details  Name: Anne Ross MRN: [id number] Date of Birth: 1941/06/19 Referring Provider:   Flowsheet Row Cardiac Rehab from 08/30/2024 in [facility] Cardiac and Pulmonary Rehab  Referring Provider Dr. Margery Ruth, MD    Encounter Date: 09/17/2024  Check In:  Session Check In - 09/17/24 0910       Check-In   Supervising physician immediately available to respond to emergencies See telemetry face sheet for immediately available ER MD    Location [facility]-Cardiac & Pulmonary Rehab    Staff Present Burnard Davenport RN,BSN,MPA;Joseph [facility] RCP,RRT,BSRT;Maxon Burnell BS, Exercise Physiologist;Jammie Clink Dyane HECKLE, ACSM CEP, Exercise Physiologist;Jason Elnor RDN,LDN    Virtual Visit No    Medication changes reported     No    Fall or balance concerns reported    No    Tobacco Cessation No Change    Warm-up and Cool-down Performed on first and last piece of equipment    Resistance Training Performed Yes    VAD Patient? No    PAD/SET Patient? No      Pain Assessment   Currently in Pain? No/denies             Social History   Tobacco Use  Smoking Status Never  Smokeless Tobacco Never    Goals Met:  Independence with exercise equipment Exercise tolerated well No report of concerns or symptoms today Strength training completed today  Goals Unmet:  Not Applicable  Comments: Pt able to follow exercise prescription today without complaint.  Will continue to monitor for progression.    Dr. Oneil Pinal is Medical Director for [facility] Cardiac Rehabilitation.  Dr. Fuad Aleskerov is Medical Director for [facility] Pulmonary Rehabilitation.

## 2024-09-19 DIAGNOSIS — I214 Non-ST elevation (NSTEMI) myocardial infarction: Secondary | ICD-10-CM

## 2024-09-19 DIAGNOSIS — Z9861 Coronary angioplasty status: Secondary | ICD-10-CM

## 2024-09-19 DIAGNOSIS — Z955 Presence of coronary angioplasty implant and graft: Secondary | ICD-10-CM

## 2024-09-19 NOTE — Progress Notes (Signed)
 Cardiac Individual Treatment Plan  Patient Details  Name: Anne Ross MRN: [id number] Date of Birth: 04-11-1941 Referring Provider:   Flowsheet Row Cardiac Rehab from 08/30/2024 in [facility] Cardiac and Pulmonary Rehab  Referring Provider Dr. Margery Ruth, MD    Initial Encounter Date:  Flowsheet Row Cardiac Rehab from 08/30/2024 in [facility] Cardiac and Pulmonary Rehab  Date 08/30/24    Visit Diagnosis: Status post coronary artery stent placement  NSTEMI (non-ST elevation myocardial infarction) [facility])  History of percutaneous coronary intervention  Patient's Home Medications on Admission:  Current Outpatient Medications:    amLODipine (NORVASC) 10 MG tablet, Take 10 mg by mouth daily., Disp: , Rfl:    ascorbic acid (VITAMIN C) 1000 MG tablet, Take 1,000 mg by mouth daily., Disp: , Rfl:    Aspirin  81 MG CAPS, Take 81 mg by mouth daily., Disp: , Rfl:    atorvastatin (LIPITOR) 80 MG tablet, Take 1 tablet by mouth daily., Disp: , Rfl:    azithromycin  (ZITHROMAX ) 250 MG tablet, Take 1 tablet (250 mg total) by mouth daily. Take first 2 tablets together, then 1 every day until finished. (Patient not taking: Reported on 08/23/2024), Disp: 6 tablet, Rfl: 0   benzonatate  (TESSALON ) 200 MG capsule, Take 1 capsule (200 mg total) by mouth 3 (three) times daily as needed. (Patient not taking: Reported on 08/23/2024), Disp: 20 capsule, Rfl: 0   Calcipotriene 0.005 % solution, 1 Application as needed., Disp: , Rfl:    Calcium Carbonate-Vitamin D  500-125 MG-UNIT TABS, Take 1 tablet by mouth daily at 10 pm., Disp: , Rfl:    Calcium Polycarbophil (FIBER) 625 MG TABS, Take 1 tablet by mouth daily., Disp: , Rfl:    carvedilol (COREG) 6.25 MG tablet, Take 6.25 mg by mouth 2 (two) times daily. (Patient not taking: Reported on 08/23/2024), Disp: , Rfl:    cetirizine (ZYRTEC) 10 MG tablet, Take 10 mg by mouth daily., Disp: , Rfl:    chlorhexidine (PERIDEX) 0.12 % solution, Use as directed 15 mLs in the mouth or  throat as needed. (Patient not taking: Reported on 08/23/2024), Disp: , Rfl:    Cholecalciferol (VITAMIN D ) 2000 units tablet, Take 2,000 Units by mouth daily., Disp: , Rfl:    clopidogrel (PLAVIX) 75 MG tablet, Take 75 mg by mouth daily., Disp: , Rfl:    fluticasone (FLONASE) 50 MCG/ACT nasal spray, Place 1 spray into both nostrils daily., Disp: , Rfl:    folic acid (FOLVITE) 800 MCG tablet, Take 400 mcg by mouth daily., Disp: , Rfl:    isosorbide mononitrate (IMDUR) 30 MG 24 hr tablet, Take 30 mg by mouth daily., Disp: , Rfl:    metoprolol  succinate (TOPROL -XL) 50 MG 24 hr tablet, TAKE 1 TABLET(50 MG) BY MOUTH EVERY DAY, Disp: , Rfl:    nitroGLYCERIN  (NITROSTAT ) 0.4 MG SL tablet, Place 1 tablet under the tongue every 5 (five) minutes as needed for chest pain (up to 3 tab)., Disp: , Rfl:    pantoprazole (PROTONIX) 40 MG tablet, Take 40 mg by mouth daily., Disp: , Rfl:    pimecrolimus (ELIDEL) 1 % cream, Apply 1 Application topically as needed., Disp: , Rfl:    pyridoxine (B-6) 100 MG tablet, Take 100 mg by mouth daily., Disp: , Rfl:    ramipril (ALTACE) 10 MG capsule, Take 10 mg by mouth daily., Disp: , Rfl:    trimethoprim  (TRIMPEX ) 100 MG tablet, Take 1 tablet (100 mg total) by mouth daily., Disp: 90 tablet, Rfl: 3   TURMERIC  PO, Take 1 capsule by mouth as needed., Disp: , Rfl:    vitamin B-12 (CYANOCOBALAMIN) 250 MCG tablet, Take 1,000 mcg by mouth daily., Disp: , Rfl:   Past Medical History: Past Medical History:  Diagnosis Date   Basal cell carcinoma    chest    Basal cell carcinoma 02/09/2024   left mid dorsal forearm - tx with ED&C   Cancer (HCC)    uterine   History of COVID-19    Hypertension     Tobacco Use: Social History   Tobacco Use  Smoking Status Never  Smokeless Tobacco Never    Labs: Review Flowsheet        No data to display           Exercise Target Goals: Exercise Program Goal: Individual exercise prescription set using results from initial 6 min  walk test and THRR while considering  patient's activity barriers and safety.   Exercise Prescription Goal: Initial exercise prescription builds to 30-45 minutes a day of aerobic activity, 2-3 days per week.  Home exercise guidelines will be given to patient during program as part of exercise prescription that the participant will acknowledge.   Education: Aerobic Exercise: - Group verbal and visual presentation on the components of exercise prescription. Introduces F.I.T.T principle from ACSM for exercise prescriptions.  Reviews F.I.T.T. principles of aerobic exercise including progression. Written material provided at class time. Flowsheet Row Cardiac Rehab from 09/12/2024 in [facility] Cardiac and Pulmonary Rehab  Education need identified 08/30/24  Date 09/12/24  Educator nt  Instruction Review Code 1- TEFL teacher Understanding    Education: Resistance Exercise: - Group verbal and visual presentation on the components of exercise prescription. Introduces F.I.T.T principle from ACSM for exercise prescriptions  Reviews F.I.T.T. principles of resistance exercise including progression. Written material provided at class time. Flowsheet Row Cardiac Rehab from 09/12/2024 in [facility] Cardiac and Pulmonary Rehab  Date 09/05/24  Educator nt  Instruction Review Code 1- TEFL teacher Understanding     Education: Exercise & Equipment Safety: - Individual verbal instruction and demonstration of equipment use and safety with use of the equipment. Flowsheet Row Cardiac Rehab from 09/12/2024 in [facility] Cardiac and Pulmonary Rehab  Date 08/30/24  Educator NT  Instruction Review Code 1- Verbalizes Understanding    Education: Exercise Physiology & General Exercise Guidelines: - Group verbal and written instruction with models to review the exercise physiology of the cardiovascular system and associated critical values. Provides general exercise guidelines with specific guidelines to those with heart or lung disease.  Written material provided at class time. Flowsheet Row Cardiac Rehab from 03/09/2023 in [facility] Cardiac and Pulmonary Rehab  Date 02/16/23  Educator [facility]  Instruction Review Code 1- Verbalizes Understanding    Education: Flexibility, Balance, Mind/Body Relaxation: - Group verbal and visual presentation with interactive activity on the components of exercise prescription. Introduces F.I.T.T principle from ACSM for exercise prescriptions. Reviews F.I.T.T. principles of flexibility and balance exercise training including progression. Also discusses the mind body connection.  Reviews various relaxation techniques to help reduce and manage stress (i.e. Deep breathing, progressive muscle relaxation, and visualization). Balance handout provided to take home. Written material provided at class time. Flowsheet Row Cardiac Rehab from 09/12/2024 in [facility] Cardiac and Pulmonary Rehab  Date 09/05/24  Educator nt  Instruction Review Code 1- Verbalizes Understanding    Activity Barriers & Risk Stratification:  Activity Barriers & Cardiac Risk Stratification - 08/30/24 0920       Activity Barriers & Cardiac Risk Stratification  Activity Barriers Balance Concerns;Muscular Weakness;Arthritis;Other (comment);Fibromyalgia    Comments gout    Cardiac Risk Stratification Moderate          6 Minute Walk:  6 Minute Walk     Row Name 08/30/24 0919         6 Minute Walk   Phase Initial     Distance 950 feet     Walk Time 6 minutes     # of Rest Breaks 0     MPH 1.8     METS 1.98     RPE 13     Perceived Dyspnea  1     VO2 Peak 6.93     Symptoms No     Resting HR 88 bpm     Resting BP 132/52     Resting Oxygen Saturation  97 %     Exercise Oxygen Saturation  during 6 min walk 97 %     Max Ex. HR 107 bpm     Max Ex. BP 158/54     2 Minute Post BP 126/50        Oxygen Initial Assessment:   Oxygen Re-Evaluation:   Oxygen Discharge (Final Oxygen Re-Evaluation):   Initial Exercise  Prescription:  Initial Exercise Prescription - 08/30/24 0900       Date of Initial Exercise RX and Referring Provider   Date 08/30/24    Referring Provider Dr. Margery Ruth, MD      Oxygen   Maintain Oxygen Saturation 88% or higher      Treadmill   MPH 1.8    Grade 0    Minutes 15    METs 2.38      Recumbant Bike   Level 2    RPM 50    Watts 15    Minutes 15    METs 1.98      NuStep   Level 2   T6 nustep   SPM 80    Minutes 15    METs 1.98      T5 Nustep   Level 1    SPM 80    Minutes 15    METs 1.98      Prescription Details   Frequency (times per week) 3    Duration Progress to 30 minutes of continuous aerobic without signs/symptoms of physical distress      Intensity   THRR 40-80% of Max Heartrate 108-128    Ratings of Perceived Exertion 11-13    Perceived Dyspnea 0-4      Progression   Progression Continue to progress workloads to maintain intensity without signs/symptoms of physical distress.      Resistance Training   Training Prescription Yes    Weight 2 lb    Reps 10-15          Perform Capillary Blood Glucose checks as needed.  Exercise Prescription Changes:   Exercise Prescription Changes     Row Name 08/30/24 0900 09/10/24 1100           Response to Exercise   Blood Pressure (Admit) 132/52 128/70      Blood Pressure (Exercise) 158/54 162/76      Blood Pressure (Exit) 126/50 124/52      Heart Rate (Admit) 88 bpm 74 bpm      Heart Rate (Exercise) 107 bpm 115 bpm      Heart Rate (Exit) 90 bpm 96 bpm      Oxygen Saturation (Admit) 97 % --      Oxygen  Saturation (Exercise) 97 % --      Rating of Perceived Exertion (Exercise) 13 15      Perceived Dyspnea (Exercise) 1 0      Symptoms none none      Comments Results first 2 weeks of exercise      Duration -- Progress to 30 minutes of  aerobic without signs/symptoms of physical distress      Intensity -- THRR unchanged        Progression   Progression -- Continue to progress  workloads to maintain intensity without signs/symptoms of physical distress.      Average METs -- 2.3        Resistance Training   Training Prescription -- Yes      Weight -- 2lb      Reps -- 10-15        Interval Training   Interval Training -- No        Recumbant Bike   Level -- 2      Watts -- 19      Minutes -- 15      METs -- 2.87        T5 Nustep   Level -- 1      Minutes -- 15      METs -- 2        Oxygen   Maintain Oxygen Saturation -- 88% or higher         Exercise Comments:   Exercise Comments     Row Name 09/03/24 [phone]           Exercise Comments First full day of exercise!  Patient was oriented to gym and equipment including functions, settings, policies, and procedures.  Patient's individual exercise prescription and treatment plan were reviewed.  All starting workloads were established based on the results of the 6 minute walk test done at initial orientation visit.  The plan for exercise progression was also introduced and progression will be customized based on patient's performance and goals.          Exercise Goals and Review:   Exercise Goals     Row Name 08/30/24 0921             Exercise Goals   Increase Physical Activity Yes       Intervention Provide advice, education, support and counseling about physical activity/exercise needs.;Develop an individualized exercise prescription for aerobic and resistive training based on initial evaluation findings, risk stratification, comorbidities and participant's personal goals.       Expected Outcomes Short Term: Attend rehab on a regular basis to increase amount of physical activity.;Long Term: Add in home exercise to make exercise part of routine and to increase amount of physical activity.;Long Term: Exercising regularly at least 3-5 days a week.       Able to understand and use rate of perceived exertion (RPE) scale Yes       Intervention Provide education and explanation on how to use RPE scale        Expected Outcomes Short Term: Able to use RPE daily in rehab to express subjective intensity level;Long Term:  Able to use RPE to guide intensity level when exercising independently       Able to understand and use Dyspnea scale Yes       Intervention Provide education and explanation on how to use Dyspnea scale       Expected Outcomes Short Term: Able to use Dyspnea scale daily in rehab to express subjective  sense of shortness of breath during exertion;Long Term: Able to use Dyspnea scale to guide intensity level when exercising independently       Knowledge and understanding of Target Heart Rate Range (THRR) Yes       Intervention Provide education and explanation of THRR including how the numbers were predicted and where they are located for reference       Expected Outcomes Short Term: Able to state/look up THRR;Short Term: Able to use daily as guideline for intensity in rehab;Long Term: Able to use THRR to govern intensity when exercising independently       Able to check pulse independently Yes       Intervention Provide education and demonstration on how to check pulse in carotid and radial arteries.;Review the importance of being able to check your own pulse for safety during independent exercise       Expected Outcomes Short Term: Able to explain why pulse checking is important during independent exercise;Long Term: Able to check pulse independently and accurately       Understanding of Exercise Prescription Yes       Intervention Provide education, explanation, and written materials on patient's individual exercise prescription       Expected Outcomes Short Term: Able to explain program exercise prescription;Long Term: Able to explain home exercise prescription to exercise independently          Exercise Goals Re-Evaluation :  Exercise Goals Re-Evaluation     Row Name 09/03/24 0923 09/10/24 1141           Exercise Goal Re-Evaluation   Exercise Goals Review Increase Physical  Activity;Able to understand and use rate of perceived exertion (RPE) scale;Knowledge and understanding of Target Heart Rate Range (THRR);Understanding of Exercise Prescription;Increase Strength and Stamina;Able to understand and use Dyspnea scale;Able to check pulse independently Increase Physical Activity;Increase Strength and Stamina;Understanding of Exercise Prescription      Comments Reviewed RPE and dyspnea scale, THR and program prescription with pt today.  Pt voiced understanding and was given a copy of goals to take home. [name] is off to a great start in the program, and was able to attend her first 3 sessions during this review period. During thses sessions she was able to use the T5 nustep at level 1 and the recumbent bike at level 2. We will continue to monitor her progress in the program.      Expected Outcomes Short: Use RPE daily to regulate intensity. Long: Follow program prescription in THR. Short: Continue to follow exercise prescription. Long: Continue exercise to improve strength and stamina.         Discharge Exercise Prescription (Final Exercise Prescription Changes):  Exercise Prescription Changes - 09/10/24 1100       Response to Exercise   Blood Pressure (Admit) 128/70    Blood Pressure (Exercise) 162/76    Blood Pressure (Exit) 124/52    Heart Rate (Admit) 74 bpm    Heart Rate (Exercise) 115 bpm    Heart Rate (Exit) 96 bpm    Rating of Perceived Exertion (Exercise) 15    Perceived Dyspnea (Exercise) 0    Symptoms none    Comments first 2 weeks of exercise    Duration Progress to 30 minutes of  aerobic without signs/symptoms of physical distress    Intensity THRR unchanged      Progression   Progression Continue to progress workloads to maintain intensity without signs/symptoms of physical distress.    Average METs 2.3  Resistance Training   Training Prescription Yes    Weight 2lb    Reps 10-15      Interval Training   Interval Training No      Recumbant  Bike   Level 2    Watts 19    Minutes 15    METs 2.87      T5 Nustep   Level 1    Minutes 15    METs 2      Oxygen   Maintain Oxygen Saturation 88% or higher          Nutrition:  Target Goals: Understanding of nutrition guidelines, daily intake of sodium 1500mg , cholesterol 200mg , calories 30% from fat and 7% or less from saturated fats, daily to have 5 or more servings of fruits and vegetables.  Education: Nutrition 1 -Group instruction provided by verbal, written material, interactive activities, discussions, models, and posters to present general guidelines for heart healthy nutrition including macronutrients, label reading, and promoting whole foods over processed counterparts. Education serves as Pensions consultant of discussion of heart healthy eating for all. Written material provided at class time.    Education: Nutrition 2 -Group instruction provided by verbal, written material, interactive activities, discussions, models, and posters to present general guidelines for heart healthy nutrition including sodium, cholesterol, and saturated fat. Providing guidance of habit forming to improve blood pressure, cholesterol, and body weight. Written material provided at class time.     Biometrics:  Pre Biometrics - 08/30/24 0921       Pre Biometrics   Height 5' 4.5 (1.638 m)    Weight 150 lb 9.6 oz (68.3 kg)    Waist Circumference 34 inches    Hip Circumference 39 inches    Waist to Hip Ratio 0.87 %    BMI (Calculated) 25.46    Single Leg Stand 10.15 seconds           Nutrition Therapy Plan and Nutrition Goals:  Nutrition Therapy & Goals - 08/30/24 0918       Nutrition Therapy   RD appointment deferred Yes      Intervention Plan   Intervention Prescribe, educate and counsel regarding individualized specific dietary modifications aiming towards targeted core components such as weight, hypertension, lipid management, diabetes, heart failure and other comorbidities.     Expected Outcomes Short Term Goal: Understand basic principles of dietary content, such as calories, fat, sodium, cholesterol and nutrients.;Short Term Goal: A plan has been developed with personal nutrition goals set during dietitian appointment.;Long Term Goal: Adherence to prescribed nutrition plan.          Nutrition Assessments:  MEDIFICTS Score Key: >=70 Need to make dietary changes  40-70 Heart Healthy Diet <= 40 Therapeutic Level Cholesterol Diet  Flowsheet Row Cardiac Rehab from 08/30/2024 in [facility] Cardiac and Pulmonary Rehab  Picture Your Plate Total Score on Admission 68   Picture Your Plate Scores: <59 Unhealthy dietary pattern with much room for improvement. 41-50 Dietary pattern unlikely to meet recommendations for good health and room for improvement. 51-60 More healthful dietary pattern, with some room for improvement.  >60 Healthy dietary pattern, although there may be some specific behaviors that could be improved.    Nutrition Goals Re-Evaluation:   Nutrition Goals Discharge (Final Nutrition Goals Re-Evaluation):   Psychosocial: Target Goals: Acknowledge presence or absence of significant depression and/or stress, maximize coping skills, provide positive support system. Participant is able to verbalize types and ability to use techniques and skills needed for reducing stress and depression.  Education: Stress, Anxiety, and Depression - Group verbal and visual presentation to define topics covered.  Reviews how body is impacted by stress, anxiety, and depression.  Also discusses healthy ways to reduce stress and to treat/manage anxiety and depression. Written material provided at class time.   Education: Sleep Hygiene -Provides group verbal and written instruction about how sleep can affect your health.  Define sleep hygiene, discuss sleep cycles and impact of sleep habits. Review good sleep hygiene tips.   Initial Review & Psychosocial Screening:  Initial  Psych Review & Screening - 08/23/24 1545       Initial Review   Current issues with None Identified      Family Dynamics   Good Support System? Yes   husband and daughter     Barriers   Psychosocial barriers to participate in program There are no identifiable barriers or psychosocial needs.;The patient should benefit from training in stress management and relaxation.      Screening Interventions   Interventions Encouraged to exercise;To provide support and resources with identified psychosocial needs    Expected Outcomes Short Term goal: Utilizing psychosocial counselor, staff and physician to assist with identification of specific Stressors or current issues interfering with healing process. Setting desired goal for each stressor or current issue identified.;Long Term Goal: Stressors or current issues are controlled or eliminated.;Short Term goal: Identification and review with participant of any Quality of Life or Depression concerns found by scoring the questionnaire.;Long Term goal: The participant improves quality of Life and PHQ9 Scores as seen by post scores and/or verbalization of changes          Quality of Life Scores:   Quality of Life - 08/30/24 0917       Quality of Life   Select Quality of Life      Quality of Life Scores   Health/Function Pre 24.5 %    Socioeconomic Pre 26.25 %    Psych/Spiritual Pre 30 %    Family Pre 28.8 %    GLOBAL Pre 26.61 %         Scores of 19 and below usually indicate a poorer quality of life in these areas.  A difference of  2-3 points is a clinically meaningful difference.  A difference of 2-3 points in the total score of the Quality of Life Index has been associated with significant improvement in overall quality of life, self-image, physical symptoms, and general health in studies assessing change in quality of life.  PHQ-9: Review Flowsheet       08/30/2024 03/11/2023 02/04/2023 01/10/2023  Depression screen PHQ 2/9  Decreased  Interest 1 0 0 0  Down, Depressed, Hopeless 1 0 0 0  PHQ - 2 Score 2 0 0 0  Altered sleeping 0 0 0 0  Tired, decreased energy 3 1 1 3   Change in appetite 1 0 0 2  Feeling bad or failure about yourself  0 0 0 0  Trouble concentrating 0 0 0 0  Moving slowly or fidgety/restless 1 0 0 2  Suicidal thoughts 0 0 0 0  PHQ-9 Score 7 1 1 7   Difficult doing work/chores Somewhat difficult Not difficult at all Not difficult at all Somewhat difficult   Interpretation of Total Score  Total Score Depression Severity:  1-4 = Minimal depression, 5-9 = Mild depression, 10-14 = Moderate depression, 15-19 = Moderately severe depression, 20-27 = Severe depression   Psychosocial Evaluation and Intervention:  Psychosocial Evaluation - 08/23/24 1548  Psychosocial Evaluation & Interventions   Interventions Encouraged to exercise with the program and follow exercise prescription    Comments Jaleigha attended the program in 2024. She had a cardiac cath 8/18 due to angina and had a follow up cath in 9/17 due to continued angina on exertion. No new stents were placed, however she did start Imdur and stop carvedilol which seems to have decreased her pain. Nhung is retired and lives at home with her husband. Her daughter is also staying with her during the day as she reports continued general weakness and difficulty completing ADLs.    Expected Outcomes Short: Attend cardiac and pulmonary rehab for education and exercise. Long: Develop and maintain positive self care habits.    Continue Psychosocial Services  Follow up required by staff          Psychosocial Re-Evaluation:   Psychosocial Discharge (Final Psychosocial Re-Evaluation):   Vocational Rehabilitation: Provide vocational rehab assistance to qualifying candidates.   Vocational Rehab Evaluation & Intervention:  Vocational Rehab - 08/23/24 1544       Initial Vocational Rehab Evaluation & Intervention   Assessment shows need for Vocational  Rehabilitation No      Vocational Rehab Re-Evaulation   Comments retired          Education: Education Goals: Education classes will be provided on a variety of topics geared toward better understanding of heart health and risk factor modification. Participant will state understanding/return demonstration of topics presented as noted by education test scores.  Learning Barriers/Preferences:  Learning Barriers/Preferences - 08/23/24 1544       Learning Barriers/Preferences   Learning Barriers None    Learning Preferences None          General Cardiac Education Topics:  AED/CPR: - Group verbal and written instruction with the use of models to demonstrate the basic use of the AED with the basic ABC's of resuscitation.   Test and Procedures: - Group verbal and visual presentation and models provide information about basic cardiac anatomy and function. Reviews the testing methods done to diagnose heart disease and the outcomes of the test results. Describes the treatment choices: Medical Management, Angioplasty, or Coronary Bypass Surgery for treating various heart conditions including Myocardial Infarction, Angina, Valve Disease, and Cardiac Arrhythmias. Written material provided at class time.   Medication Safety: - Group verbal and visual instruction to review commonly prescribed medications for heart and lung disease. Reviews the medication, class of the drug, and side effects. Includes the steps to properly store meds and maintain the prescription regimen. Written material provided at class time. Flowsheet Row Cardiac Rehab from 03/09/2023 in [facility] Cardiac and Pulmonary Rehab  Date 01/19/23  Educator MS  Instruction Review Code 1- Verbalizes Understanding    Intimacy: - Group verbal instruction through game format to discuss how heart and lung disease can affect sexual intimacy. Written material provided at class time. Flowsheet Row Cardiac Rehab from 09/12/2024 in [facility]  Cardiac and Pulmonary Rehab  Date 09/12/24  Educator nt  Instruction Review Code 1- TEFL teacher Understanding    Know Your Numbers and Heart Failure: - Group verbal and visual instruction to discuss disease risk factors for cardiac and pulmonary disease and treatment options.  Reviews associated critical values for Overweight/Obesity, Hypertension, Cholesterol, and Diabetes.  Discusses basics of heart failure: signs/symptoms and treatments.  Introduces Heart Failure Zone chart for action plan for heart failure. Written material provided at class time. Flowsheet Row Cardiac Rehab from 03/09/2023 in [facility] Cardiac and Pulmonary Rehab  Date 01/26/23  Educator MC  Instruction Review Code 1- Verbalizes Understanding    Infection Prevention: - Provides verbal and written material to individual with discussion of infection control including proper hand washing and proper equipment cleaning during exercise session. Flowsheet Row Cardiac Rehab from 09/12/2024 in [facility] Cardiac and Pulmonary Rehab  Date 08/30/24  Educator NT  Instruction Review Code 1- Verbalizes Understanding    Falls Prevention: - Provides verbal and written material to individual with discussion of falls prevention and safety. Flowsheet Row Cardiac Rehab from 09/12/2024 in [facility] Cardiac and Pulmonary Rehab  Date 08/30/24  Educator NT  Instruction Review Code 1- Verbalizes Understanding    Other: -Provides group and verbal instruction on various topics (see comments) Flowsheet Row Cardiac Rehab from 03/09/2023 in [facility] Cardiac and Pulmonary Rehab  Date 01/12/23  Educator SB  Instruction Review Code 1- Verbalizes Understanding    Knowledge Questionnaire Score:  Knowledge Questionnaire Score - 08/30/24 9082       Knowledge Questionnaire Score   Pre Score 24/26          Core Components/Risk Factors/Patient Goals at Admission:  Personal Goals and Risk Factors at Admission - 08/23/24 1543       Core Components/Risk  Factors/Patient Goals on Admission    Weight Management Weight Maintenance;Yes    Intervention Weight Management: Develop a combined nutrition and exercise program designed to reach desired caloric intake, while maintaining appropriate intake of nutrient and fiber, sodium and fats, and appropriate energy expenditure required for the weight goal.;Weight Management: Provide education and appropriate resources to help participant work on and attain dietary goals.    Expected Outcomes Short Term: Continue to assess and modify interventions until short term weight is achieved;Long Term: Adherence to nutrition and physical activity/exercise program aimed toward attainment of established weight goal;Understanding recommendations for meals to include 15-35% energy as protein, 25-35% energy from fat, 35-60% energy from carbohydrates, less than 200mg  of dietary cholesterol, 20-35 gm of total fiber daily;Understanding of distribution of calorie intake throughout the day with the consumption of 4-5 meals/snacks;Weight Maintenance: Understanding of the daily nutrition guidelines, which includes 25-35% calories from fat, 7% or less cal from saturated fats, less than 200mg  cholesterol, less than 1.5gm of sodium, & 5 or more servings of fruits and vegetables daily    Hypertension Yes    Intervention Provide education on lifestyle modifcations including regular physical activity/exercise, weight management, moderate sodium restriction and increased consumption of fresh fruit, vegetables, and low fat dairy, alcohol moderation, and smoking cessation.;Monitor prescription use compliance.    Expected Outcomes Short Term: Continued assessment and intervention until BP is < 140/30mm HG in hypertensive participants. < 130/96mm HG in hypertensive participants with diabetes, heart failure or chronic kidney disease.;Long Term: Maintenance of blood pressure at goal levels.    Lipids Yes    Intervention Provide education and support for  participant on nutrition & aerobic/resistive exercise along with prescribed medications to achieve LDL 70mg , HDL >40mg .    Expected Outcomes Short Term: Participant states understanding of desired cholesterol values and is compliant with medications prescribed. Participant is following exercise prescription and nutrition guidelines.;Long Term: Cholesterol controlled with medications as prescribed, with individualized exercise RX and with personalized nutrition plan. Value goals: LDL < 70mg , HDL > 40 mg.          Education:Diabetes - Individual verbal and written instruction to review signs/symptoms of diabetes, desired ranges of glucose level fasting, after meals and with exercise. Acknowledge that pre and post exercise glucose  checks will be done for 3 sessions at entry of program.   Core Components/Risk Factors/Patient Goals Review:    Core Components/Risk Factors/Patient Goals at Discharge (Final Review):    ITP Comments:  ITP Comments     Row Name 08/23/24 1556 08/30/24 0915 09/03/24 0923 09/19/24 0828     ITP Comments Initial phone call completed. Diagnosis can be found in [facility] 8/18. EP Orientation scheduled for Thursday 08/30/24 at 0800. SABRA Completed and gym orientation for cardiac rehab. Initial ITP created and sent for review to Dr. Oneil Pinal, Medical Director. First full day of exercise!  Patient was oriented to gym and equipment including functions, settings, policies, and procedures.  Patient's individual exercise prescription and treatment plan were reviewed.  All starting workloads were established based on the results of the 6 minute walk test done at initial orientation visit.  The plan for exercise progression was also introduced and progression will be customized based on patient's performance and goals. 30 Day review completed. Medical Director ITP review done, changes made as directed, and signed approval by Medical Director. New to program.       Comments: 30 day  review

## 2024-09-26 ENCOUNTER — Encounter: Admitting: Emergency Medicine

## 2024-09-26 DIAGNOSIS — Z9861 Coronary angioplasty status: Secondary | ICD-10-CM | POA: Diagnosis not present

## 2024-09-26 NOTE — Progress Notes (Signed)
 Daily Session Note  Patient Details  Name: Anne Ross MRN: [id number] Date of Birth: Dec 22, 1940 Referring Provider:   Flowsheet Row Cardiac Rehab from 08/30/2024 in [facility] Cardiac and Pulmonary Rehab  Referring Provider Dr. Margery Ruth, MD    Encounter Date: 09/26/2024  Check In:  Session Check In - 09/26/24 0939       Check-In   Supervising physician immediately available to respond to emergencies See telemetry face sheet for immediately available ER MD    Location [facility]-Cardiac & Pulmonary Rehab    Staff Present Leita Franks RN,BSN;Joseph [facility] RCP,RRT,BSRT;Margaret Best, MS, Exercise Physiologist;Noah Tickle, BS, Exercise Physiologist    Virtual Visit No    Medication changes reported     No    Fall or balance concerns reported    No    Tobacco Cessation No Change    Warm-up and Cool-down Performed on first and last piece of equipment    Resistance Training Performed Yes    VAD Patient? No    PAD/SET Patient? No      Pain Assessment   Currently in Pain? No/denies             Social History   Tobacco Use  Smoking Status Never  Smokeless Tobacco Never    Goals Met:  Independence with exercise equipment Exercise tolerated well No report of concerns or symptoms today Strength training completed today  Goals Unmet:  Not Applicable  Comments: Pt able to follow exercise prescription today without complaint.  Will continue to monitor for progression.    Dr. Oneil Pinal is Medical Director for [facility] Cardiac Rehabilitation.  Dr. Fuad Aleskerov is Medical Director for [facility] Pulmonary Rehabilitation.

## 2024-09-28 ENCOUNTER — Encounter: Admitting: Emergency Medicine

## 2024-09-28 DIAGNOSIS — Z9861 Coronary angioplasty status: Secondary | ICD-10-CM

## 2024-09-28 NOTE — Progress Notes (Signed)
 Daily Session Note  Patient Details  Name: Anne Ross MRN: [id number] Date of Birth: 23-Jul-1941 Referring Provider:   Flowsheet Row Cardiac Rehab from 08/30/2024 in [facility] Cardiac and Pulmonary Rehab  Referring Provider Dr. Margery Ruth, MD    Encounter Date: 09/28/2024  Check In:  Session Check In - 09/28/24 0928       Check-In   Supervising physician immediately available to respond to emergencies See telemetry face sheet for immediately available ER MD    Location [facility]-Cardiac & Pulmonary Rehab    Staff Present Devaughn Jaeger, BS, Exercise Physiologist;Zariah Jost RN,BSN;Joseph Hood RCP,RRT,BSRT;Maxon [place] BS, Exercise Physiologist    Virtual Visit No    Medication changes reported     No    Fall or balance concerns reported    No    Tobacco Cessation No Change    Warm-up and Cool-down Performed on first and last piece of equipment    Resistance Training Performed Yes    VAD Patient? No    PAD/SET Patient? No      Pain Assessment   Currently in Pain? No/denies             Social History   Tobacco Use  Smoking Status Never  Smokeless Tobacco Never    Goals Met:  Independence with exercise equipment Exercise tolerated well No report of concerns or symptoms today Strength training completed today  Goals Unmet:  Not Applicable  Comments: Pt able to follow exercise prescription today without complaint.  Will continue to monitor for progression.    Dr. Oneil Pinal is Medical Director for [facility] Cardiac Rehabilitation.  Dr. Fuad Aleskerov is Medical Director for [facility] Pulmonary Rehabilitation.

## 2024-10-01 ENCOUNTER — Encounter: Attending: Cardiology | Admitting: *Deleted

## 2024-10-01 DIAGNOSIS — I214 Non-ST elevation (NSTEMI) myocardial infarction: Secondary | ICD-10-CM | POA: Diagnosis present

## 2024-10-01 DIAGNOSIS — Z955 Presence of coronary angioplasty implant and graft: Secondary | ICD-10-CM | POA: Insufficient documentation

## 2024-10-01 NOTE — Progress Notes (Signed)
 Daily Session Note  Patient Details  Name: Anne Ross MRN: [id number] Date of Birth: 08/10/1941 Referring Provider:   Flowsheet Row Cardiac Rehab from 08/30/2024 in [facility] Cardiac and Pulmonary Rehab  Referring Provider Dr. Margery Ruth, MD    Encounter Date: 10/01/2024  Check In:  Session Check In - 10/01/24 0936       Check-In   Supervising physician immediately available to respond to emergencies See telemetry face sheet for immediately available ER MD    Location [facility]-Cardiac & Pulmonary Rehab    Staff Present Othel Durand, RN, BSN, CCRP;Laura Cates RN,BSN;Joseph Hood RCP,RRT,BSRT;Kelly Hanover BS, ACSM CEP, Exercise Physiologist    Virtual Visit No    Medication changes reported     No    Fall or balance concerns reported    No    Warm-up and Cool-down Performed on first and last piece of equipment    Resistance Training Performed Yes    VAD Patient? No    PAD/SET Patient? No      Pain Assessment   Currently in Pain? No/denies             Social History   Tobacco Use  Smoking Status Never  Smokeless Tobacco Never    Goals Met:  Independence with exercise equipment Exercise tolerated well No report of concerns or symptoms today  Goals Unmet:  Not Applicable  Comments: Pt able to follow exercise prescription today without complaint.  Will continue to monitor for progression.    Dr. Oneil Pinal is Medical Director for [facility] Cardiac Rehabilitation.  Dr. Fuad Aleskerov is Medical Director for [facility] Pulmonary Rehabilitation.

## 2024-10-03 ENCOUNTER — Encounter: Admitting: Emergency Medicine

## 2024-10-03 DIAGNOSIS — I214 Non-ST elevation (NSTEMI) myocardial infarction: Secondary | ICD-10-CM | POA: Diagnosis not present

## 2024-10-03 DIAGNOSIS — Z955 Presence of coronary angioplasty implant and graft: Secondary | ICD-10-CM

## 2024-10-03 NOTE — Progress Notes (Signed)
 Daily Session Note  Patient Details  Name: Anne Ross MRN: [id number] Date of Birth: 1941/05/03 Referring Provider:   Flowsheet Row Cardiac Rehab from 08/30/2024 in [facility] Cardiac and Pulmonary Rehab  Referring Provider Dr. Margery Ruth, MD    Encounter Date: 10/03/2024  Check In:  Session Check In - 10/03/24 0929       Check-In   Supervising physician immediately available to respond to emergencies See telemetry face sheet for immediately available ER MD    Location [facility]-Cardiac & Pulmonary Rehab    Staff Present Leita Franks RN,BSN;Joseph [facility] BS, Exercise Physiologist;Margaret Best, MS, Exercise Physiologist    Virtual Visit No    Medication changes reported     No    Fall or balance concerns reported    No    Tobacco Cessation No Change    Warm-up and Cool-down Performed on first and last piece of equipment    Resistance Training Performed Yes    VAD Patient? No    PAD/SET Patient? No      Pain Assessment   Currently in Pain? No/denies             Social History   Tobacco Use  Smoking Status Never  Smokeless Tobacco Never    Goals Met:  Independence with exercise equipment Exercise tolerated well No report of concerns or symptoms today Strength training completed today  Goals Unmet:  Not Applicable  Comments: Pt able to follow exercise prescription today without complaint.  Will continue to monitor for progression.    Dr. Oneil Pinal is Medical Director for [facility] Cardiac Rehabilitation.  Dr. Fuad Aleskerov is Medical Director for [facility] Pulmonary Rehabilitation.

## 2024-10-05 ENCOUNTER — Encounter: Admitting: Emergency Medicine

## 2024-10-05 DIAGNOSIS — I214 Non-ST elevation (NSTEMI) myocardial infarction: Secondary | ICD-10-CM | POA: Diagnosis not present

## 2024-10-05 DIAGNOSIS — Z9861 Coronary angioplasty status: Secondary | ICD-10-CM

## 2024-10-05 NOTE — Progress Notes (Signed)
 Daily Session Note  Patient Details  Name: Anne Ross MRN: [id number] Date of Birth: 05/20/1941 Referring Provider:   Flowsheet Row Cardiac Rehab from 08/30/2024 in [facility] Cardiac and Pulmonary Rehab  Referring Provider Dr. Margery Ruth, MD    Encounter Date: 10/05/2024  Check In:  Session Check In - 10/05/24 0920       Check-In   Supervising physician immediately available to respond to emergencies See telemetry face sheet for immediately available ER MD    Location [facility]-Cardiac & Pulmonary Rehab    Staff Present Leita Franks RN,BSN;Joseph [facility] BS, Exercise Physiologist;Noah Tickle, BS, Exercise Physiologist    Virtual Visit No    Medication changes reported     No    Fall or balance concerns reported    No    Tobacco Cessation No Change    Warm-up and Cool-down Performed on first and last piece of equipment    Resistance Training Performed Yes    VAD Patient? No    PAD/SET Patient? No      Pain Assessment   Currently in Pain? No/denies             Social History   Tobacco Use  Smoking Status Never  Smokeless Tobacco Never    Goals Met:  Independence with exercise equipment Exercise tolerated well No report of concerns or symptoms today Strength training completed today  Goals Unmet:  Not Applicable  Comments: Pt able to follow exercise prescription today without complaint.  Will continue to monitor for progression.    Dr. Oneil Pinal is Medical Director for [facility] Cardiac Rehabilitation.  Dr. Fuad Aleskerov is Medical Director for [facility] Pulmonary Rehabilitation.

## 2024-10-08 ENCOUNTER — Encounter

## 2024-10-08 DIAGNOSIS — I214 Non-ST elevation (NSTEMI) myocardial infarction: Secondary | ICD-10-CM | POA: Diagnosis not present

## 2024-10-08 DIAGNOSIS — Z955 Presence of coronary angioplasty implant and graft: Secondary | ICD-10-CM

## 2024-10-08 NOTE — Progress Notes (Signed)
 Daily Session Note  Patient Details  Name: Anne Ross MRN: [id number] Date of Birth: [birth year] Referring Provider:   Flowsheet Row Cardiac Rehab from 08/30/2024 in [facility] Cardiac and Pulmonary Rehab  Referring Provider Dr. Margery Ruth, MD    Encounter Date: 10/08/2024  Check In:  Session Check In - 10/08/24 0905       Check-In   Supervising physician immediately available to respond to emergencies See telemetry face sheet for immediately available ER MD    Location [facility]-Cardiac & Pulmonary Rehab    Staff Present Burnard Hint BS, ACSM CEP, Exercise Physiologist;Joseph [facility] RCP,RRT,BSRT;Lukisha Procida RN,BSN,MPA;Mary Peggi, RN, DNP, NE-BC;Maxon Conetta BS, Exercise Physiologist    Medication changes reported     No    Fall or balance concerns reported    No    Tobacco Cessation No Change    Warm-up and Cool-down Performed on first and last piece of equipment    Resistance Training Performed Yes    VAD Patient? No    PAD/SET Patient? No      Pain Assessment   Currently in Pain? No/denies             Social History   Tobacco Use  Smoking Status Never  Smokeless Tobacco Never    Goals Met:  Independence with exercise equipment Exercise tolerated well No report of concerns or symptoms today Strength training completed today  Goals Unmet:  Not Applicable  Comments: Pt able to follow exercise prescription today without complaint.  Will continue to monitor for progression.    Dr. Oneil Pinal is Medical Director for [facility] Cardiac Rehabilitation.  Dr. Fuad Aleskerov is Medical Director for [facility] Pulmonary Rehabilitation.

## 2024-10-10 ENCOUNTER — Encounter: Admitting: Emergency Medicine

## 2024-10-10 DIAGNOSIS — Z9861 Coronary angioplasty status: Secondary | ICD-10-CM

## 2024-10-10 DIAGNOSIS — I214 Non-ST elevation (NSTEMI) myocardial infarction: Secondary | ICD-10-CM | POA: Diagnosis not present

## 2024-10-10 NOTE — Progress Notes (Signed)
 Daily Session Note  Patient Details  Name: Anne Ross MRN: [id number] Date of Birth: 1941/08/21 Referring Provider:   Flowsheet Row Cardiac Rehab from 08/30/2024 in [facility] Cardiac and Pulmonary Rehab  Referring Provider Dr. Margery Ruth, MD    Encounter Date: 10/10/2024  Check In:  Session Check In - 10/10/24 1013       Check-In   Supervising physician immediately available to respond to emergencies See telemetry face sheet for immediately available ER MD    Location [facility]-Cardiac & Pulmonary Rehab    Staff Present Leita Franks RN,BSN;Maxon Burnell BS, Exercise Physiologist;Kelly Dyane HECKLE, ACSM CEP, Exercise Physiologist;Margaret Best, MS, Exercise Physiologist    Virtual Visit No    Medication changes reported     No    Fall or balance concerns reported    No    Tobacco Cessation No Change    Warm-up and Cool-down Performed on first and last piece of equipment    Resistance Training Performed Yes    VAD Patient? No    PAD/SET Patient? No      Pain Assessment   Currently in Pain? No/denies             Social History   Tobacco Use  Smoking Status Never  Smokeless Tobacco Never    Goals Met:  Independence with exercise equipment Exercise tolerated well No report of concerns or symptoms today Strength training completed today  Goals Unmet:  Not Applicable  Comments: Pt able to follow exercise prescription today without complaint.  Will continue to monitor for progression.    Dr. Oneil Pinal is Medical Director for [facility] Cardiac Rehabilitation.  Dr. Fuad Aleskerov is Medical Director for [facility] Pulmonary Rehabilitation.

## 2024-10-12 ENCOUNTER — Encounter

## 2024-10-12 DIAGNOSIS — Z955 Presence of coronary angioplasty implant and graft: Secondary | ICD-10-CM

## 2024-10-12 DIAGNOSIS — I214 Non-ST elevation (NSTEMI) myocardial infarction: Secondary | ICD-10-CM | POA: Diagnosis not present

## 2024-10-12 NOTE — Progress Notes (Signed)
 Daily Session Note  Patient Details  Name: Parisa Pinela MRN: [id number] Date of Birth: Aug 13, 1941 Referring Provider:   Flowsheet Row Cardiac Rehab from 08/30/2024 in [facility] Cardiac and Pulmonary Rehab  Referring Provider Dr. Margery Ruth, MD    Encounter Date: 10/12/2024  Check In:  Session Check In - 10/12/24 0920       Check-In   Supervising physician immediately available to respond to emergencies See telemetry face sheet for immediately available ER MD    Location [facility]-Cardiac & Pulmonary Rehab    Staff Present Burnard Davenport RN,BSN,MPA;Meredith Tressa RN,BSN;Joseph Rolinda RCP,RRT,BSRT;Noah Tickle, [place], Exercise Physiologist    Virtual Visit No    Medication changes reported     No    Fall or balance concerns reported    No    Tobacco Cessation No Change    Warm-up and Cool-down Performed on first and last piece of equipment    Resistance Training Performed Yes    VAD Patient? No    PAD/SET Patient? No      Pain Assessment   Currently in Pain? No/denies             Social History   Tobacco Use  Smoking Status Never  Smokeless Tobacco Never    Goals Met:  Independence with exercise equipment Exercise tolerated well No report of concerns or symptoms today Strength training completed today  Goals Unmet:  Not Applicable  Comments: Pt able to follow exercise prescription today without complaint.  Will continue to monitor for progression.    Dr. Oneil Pinal is Medical Director for [facility] Cardiac Rehabilitation.  Dr. Fuad Aleskerov is Medical Director for [facility] Pulmonary Rehabilitation.

## 2024-10-15 ENCOUNTER — Encounter

## 2024-10-15 DIAGNOSIS — I214 Non-ST elevation (NSTEMI) myocardial infarction: Secondary | ICD-10-CM | POA: Diagnosis not present

## 2024-10-15 DIAGNOSIS — Z9861 Coronary angioplasty status: Secondary | ICD-10-CM

## 2024-10-15 DIAGNOSIS — Z955 Presence of coronary angioplasty implant and graft: Secondary | ICD-10-CM

## 2024-10-15 NOTE — Progress Notes (Signed)
 Daily Session Note  Patient Details  Name: Anne Ross MRN: [id number] Date of Birth: [birth year] Referring Provider:   Flowsheet Row Cardiac Rehab from 08/30/2024 in [facility] Cardiac and Pulmonary Rehab  Referring Provider Dr. Margery Ruth, MD    Encounter Date: 10/15/2024  Check In:  Session Check In - 10/15/24 0923       Check-In   Supervising physician immediately available to respond to emergencies See telemetry face sheet for immediately available ER MD    Location [facility]-Cardiac & Pulmonary Rehab    Staff Present Burnard Davenport RN,BSN,MPA;Joseph Rolinda RCP,RRT,BSRT;Laura Cates RN,BSN;Jastin Fore Dyane BS, ACSM CEP, Exercise Physiologist    Virtual Visit No    Medication changes reported     No    Fall or balance concerns reported    No    Tobacco Cessation No Change    Warm-up and Cool-down Performed on first and last piece of equipment    Resistance Training Performed Yes    VAD Patient? No    PAD/SET Patient? No      Pain Assessment   Currently in Pain? No/denies             Social History   Tobacco Use  Smoking Status Never  Smokeless Tobacco Never    Goals Met:  Independence with exercise equipment Exercise tolerated well No report of concerns or symptoms today Strength training completed today  Goals Unmet:  Not Applicable  Comments: Pt able to follow exercise prescription today without complaint.  Will continue to monitor for progression.    Dr. Oneil Pinal is Medical Director for [facility] Cardiac Rehabilitation.  Dr. Fuad Aleskerov is Medical Director for [facility] Pulmonary Rehabilitation.

## 2024-10-17 ENCOUNTER — Encounter: Payer: Self-pay | Admitting: *Deleted

## 2024-10-17 ENCOUNTER — Encounter: Admitting: *Deleted

## 2024-10-17 DIAGNOSIS — I214 Non-ST elevation (NSTEMI) myocardial infarction: Secondary | ICD-10-CM

## 2024-10-17 DIAGNOSIS — Z955 Presence of coronary angioplasty implant and graft: Secondary | ICD-10-CM

## 2024-10-17 NOTE — Progress Notes (Signed)
 Cardiac Individual Treatment Plan  Patient Details  Name: Anne Ross MRN: [id number] Date of Birth: November 18, 1941 Referring Provider:   Flowsheet Row Cardiac Rehab from 08/30/2024 in [facility] Cardiac and Pulmonary Rehab  Referring Provider Dr. Margery Ruth, MD    Initial Encounter Date:  Flowsheet Row Cardiac Rehab from 08/30/2024 in [facility] Cardiac and Pulmonary Rehab  Date 08/30/24    Visit Diagnosis: Status post coronary artery stent placement  Patient's Home Medications on Admission:  Current Outpatient Medications:    amLODipine (NORVASC) 10 MG tablet, Take 10 mg by mouth daily., Disp: , Rfl:    ascorbic acid (VITAMIN C) 1000 MG tablet, Take 1,000 mg by mouth daily., Disp: , Rfl:    Aspirin  81 MG CAPS, Take 81 mg by mouth daily., Disp: , Rfl:    atorvastatin (LIPITOR) 80 MG tablet, Take 1 tablet by mouth daily., Disp: , Rfl:    azithromycin  (ZITHROMAX ) 250 MG tablet, Take 1 tablet (250 mg total) by mouth daily. Take first 2 tablets together, then 1 every day until finished. (Patient not taking: Reported on 08/23/2024), Disp: 6 tablet, Rfl: 0   benzonatate  (TESSALON ) 200 MG capsule, Take 1 capsule (200 mg total) by mouth 3 (three) times daily as needed. (Patient not taking: Reported on 08/23/2024), Disp: 20 capsule, Rfl: 0   Calcipotriene 0.005 % solution, 1 Application as needed., Disp: , Rfl:    Calcium Carbonate-Vitamin D  500-125 MG-UNIT TABS, Take 1 tablet by mouth daily at 10 pm., Disp: , Rfl:    Calcium Polycarbophil (FIBER) 625 MG TABS, Take 1 tablet by mouth daily., Disp: , Rfl:    carvedilol (COREG) 6.25 MG tablet, Take 6.25 mg by mouth 2 (two) times daily. (Patient not taking: Reported on 08/23/2024), Disp: , Rfl:    cetirizine (ZYRTEC) 10 MG tablet, Take 10 mg by mouth daily., Disp: , Rfl:    chlorhexidine (PERIDEX) 0.12 % solution, Use as directed 15 mLs in the mouth or throat as needed. (Patient not taking: Reported on 08/23/2024), Disp: , Rfl:    Cholecalciferol (VITAMIN D )  2000 units tablet, Take 2,000 Units by mouth daily., Disp: , Rfl:    clopidogrel (PLAVIX) 75 MG tablet, Take 75 mg by mouth daily., Disp: , Rfl:    fluticasone (FLONASE) 50 MCG/ACT nasal spray, Place 1 spray into both nostrils daily., Disp: , Rfl:    folic acid (FOLVITE) 800 MCG tablet, Take 400 mcg by mouth daily., Disp: , Rfl:    isosorbide mononitrate (IMDUR) 30 MG 24 hr tablet, Take 30 mg by mouth daily., Disp: , Rfl:    metoprolol  succinate (TOPROL -XL) 50 MG 24 hr tablet, TAKE 1 TABLET(50 MG) BY MOUTH EVERY DAY, Disp: , Rfl:    nitroGLYCERIN  (NITROSTAT ) 0.4 MG SL tablet, Place 1 tablet under the tongue every 5 (five) minutes as needed for chest pain (up to 3 tab)., Disp: , Rfl:    pantoprazole (PROTONIX) 40 MG tablet, Take 40 mg by mouth daily., Disp: , Rfl:    pimecrolimus (ELIDEL) 1 % cream, Apply 1 Application topically as needed., Disp: , Rfl:    pyridoxine (B-6) 100 MG tablet, Take 100 mg by mouth daily., Disp: , Rfl:    ramipril (ALTACE) 10 MG capsule, Take 10 mg by mouth daily., Disp: , Rfl:    trimethoprim  (TRIMPEX ) 100 MG tablet, Take 1 tablet (100 mg total) by mouth daily., Disp: 90 tablet, Rfl: 3   TURMERIC PO, Take 1 capsule by mouth as needed., Disp: , Rfl:  vitamin B-12 (CYANOCOBALAMIN) 250 MCG tablet, Take 1,000 mcg by mouth daily., Disp: , Rfl:   Past Medical History: Past Medical History:  Diagnosis Date   Basal cell carcinoma    chest    Basal cell carcinoma 02/09/2024   left mid dorsal forearm - tx with ED&C   Cancer (HCC)    uterine   History of COVID-19    Hypertension     Tobacco Use: Social History   Tobacco Use  Smoking Status Never  Smokeless Tobacco Never    Labs: Review Flowsheet        No data to display           Exercise Target Goals: Exercise Program Goal: Individual exercise prescription set using results from initial 6 min walk test and THRR while considering  patient's activity barriers and safety.   Exercise Prescription  Goal: Initial exercise prescription builds to 30-45 minutes a day of aerobic activity, 2-3 days per week.  Home exercise guidelines will be given to patient during program as part of exercise prescription that the participant will acknowledge.   Education: Aerobic Exercise: - Group verbal and visual presentation on the components of exercise prescription. Introduces F.I.T.T principle from ACSM for exercise prescriptions.  Reviews F.I.T.T. principles of aerobic exercise including progression. Written material provided at class time. Flowsheet Row Cardiac Rehab from 10/10/2024 in [facility] Cardiac and Pulmonary Rehab  Education need identified 08/30/24  Date 09/12/24  Educator nt  Instruction Review Code 1- Tefl Teacher Understanding    Education: Resistance Exercise: - Group verbal and visual presentation on the components of exercise prescription. Introduces F.I.T.T principle from ACSM for exercise prescriptions  Reviews F.I.T.T. principles of resistance exercise including progression. Written material provided at class time. Flowsheet Row Cardiac Rehab from 10/10/2024 in [facility] Cardiac and Pulmonary Rehab  Date 09/05/24  Educator nt  Instruction Review Code 1- Tefl Teacher Understanding     Education: Exercise & Equipment Safety: - Individual verbal instruction and demonstration of equipment use and safety with use of the equipment. Flowsheet Row Cardiac Rehab from 10/10/2024 in [facility] Cardiac and Pulmonary Rehab  Date 08/30/24  Educator NT  Instruction Review Code 1- Verbalizes Understanding    Education: Exercise Physiology & General Exercise Guidelines: - Group verbal and written instruction with models to review the exercise physiology of the cardiovascular system and associated critical values. Provides general exercise guidelines with specific guidelines to those with heart or lung disease. Written material provided at class time. Flowsheet Row Cardiac Rehab from 03/09/2023 in [facility] Cardiac  and Pulmonary Rehab  Date 02/16/23  Educator [facility]  Instruction Review Code 1- Verbalizes Understanding    Education: Flexibility, Balance, Mind/Body Relaxation: - Group verbal and visual presentation with interactive activity on the components of exercise prescription. Introduces F.I.T.T principle from ACSM for exercise prescriptions. Reviews F.I.T.T. principles of flexibility and balance exercise training including progression. Also discusses the mind body connection.  Reviews various relaxation techniques to help reduce and manage stress (i.e. Deep breathing, progressive muscle relaxation, and visualization). Balance handout provided to take home. Written material provided at class time. Flowsheet Row Cardiac Rehab from 10/10/2024 in [facility] Cardiac and Pulmonary Rehab  Date 09/05/24  Educator nt  Instruction Review Code 1- Verbalizes Understanding    Activity Barriers & Risk Stratification:  Activity Barriers & Cardiac Risk Stratification - 08/30/24 0920       Activity Barriers & Cardiac Risk Stratification   Activity Barriers Balance Concerns;Muscular Weakness;Arthritis;Other (comment);Fibromyalgia    Comments gout  Cardiac Risk Stratification Moderate          6 Minute Walk:  6 Minute Walk     Row Name 08/30/24 0919         6 Minute Walk   Phase Initial     Distance 950 feet     Walk Time 6 minutes     # of Rest Breaks 0     MPH 1.8     METS 1.98     RPE 13     Perceived Dyspnea  1     VO2 Peak 6.93     Symptoms No     Resting HR 88 bpm     Resting BP 132/52     Resting Oxygen Saturation  97 %     Exercise Oxygen Saturation  during 6 min walk 97 %     Max Ex. HR 107 bpm     Max Ex. BP 158/54     2 Minute Post BP 126/50        Oxygen Initial Assessment:   Oxygen Re-Evaluation:   Oxygen Discharge (Final Oxygen Re-Evaluation):   Initial Exercise Prescription:  Initial Exercise Prescription - 08/30/24 0900       Date of Initial Exercise RX and  Referring Provider   Date 08/30/24    Referring Provider Dr. Margery Ruth, MD      Oxygen   Maintain Oxygen Saturation 88% or higher      Treadmill   MPH 1.8    Grade 0    Minutes 15    METs 2.38      Recumbant Bike   Level 2    RPM 50    Watts 15    Minutes 15    METs 1.98      NuStep   Level 2   T6 nustep   SPM 80    Minutes 15    METs 1.98      T5 Nustep   Level 1    SPM 80    Minutes 15    METs 1.98      Prescription Details   Frequency (times per week) 3    Duration Progress to 30 minutes of continuous aerobic without signs/symptoms of physical distress      Intensity   THRR 40-80% of Max Heartrate 108-128    Ratings of Perceived Exertion 11-13    Perceived Dyspnea 0-4      Progression   Progression Continue to progress workloads to maintain intensity without signs/symptoms of physical distress.      Resistance Training   Training Prescription Yes    Weight 2 lb    Reps 10-15          Perform Capillary Blood Glucose checks as needed.  Exercise Prescription Changes:   Exercise Prescription Changes     Row Name 08/30/24 0900 09/10/24 1100 09/25/24 1400 10/03/24 0900 10/11/24 1600     Response to Exercise   Blood Pressure (Admit) 132/52 128/70 132/78 -- 122/60   Blood Pressure (Exercise) 158/54 162/76 164/80 -- 148/58   Blood Pressure (Exit) 126/50 124/52 132/80 -- 112/56   Heart Rate (Admit) 88 bpm 74 bpm 82 bpm -- 83 bpm   Heart Rate (Exercise) 107 bpm 115 bpm 119 bpm -- 120 bpm   Heart Rate (Exit) 90 bpm 96 bpm 92 bpm -- 86 bpm   Oxygen Saturation (Admit) 97 % -- -- -- --   Oxygen Saturation (Exercise) 97 % -- -- -- --  Rating of Perceived Exertion (Exercise) 13 15 14  -- 13   Perceived Dyspnea (Exercise) 1 0 1 -- --   Symptoms none none none -- none   Comments Results first 2 weeks of exercise -- -- --   Duration -- Progress to 30 minutes of  aerobic without signs/symptoms of physical distress Continue with 30 min of aerobic exercise  without signs/symptoms of physical distress. -- Continue with 30 min of aerobic exercise without signs/symptoms of physical distress.   Intensity -- THRR unchanged THRR unchanged -- THRR unchanged     Progression   Progression -- Continue to progress workloads to maintain intensity without signs/symptoms of physical distress. Continue to progress workloads to maintain intensity without signs/symptoms of physical distress. -- Continue to progress workloads to maintain intensity without signs/symptoms of physical distress.   Average METs -- 2.3 2.42 -- 2.4     Resistance Training   Training Prescription -- Yes Yes -- Yes   Weight -- 2lb 2 lb -- 2 lb   Reps -- 10-15 10-15 -- 10-15     Interval Training   Interval Training -- No No -- No     Treadmill   MPH -- -- 2 -- 2.2   Grade -- -- 1 -- 1   Minutes -- -- 15 -- 15   METs -- -- 2.81 -- 2.99     Recumbant Bike   Level -- 2 1 -- 2   Watts -- 19 18 -- 20   Minutes -- 15 15 -- 15   METs -- 2.87 2.88 -- 2.91     NuStep   Level -- -- 2  T6 nustep -- 2   Minutes -- -- 15 -- 15   METs -- -- 1.9 -- 2.4     T5 Nustep   Level -- 1 2 -- 2   Minutes -- 15 15 -- 15   METs -- 2 1.9 -- 2     Home Exercise Plan   Plans to continue exercise at -- -- -- Home (comment)  Treadmill at home and 3 lb handweights Home (comment)  Treadmill at home and 3 lb handweights   Frequency -- -- -- Add 2 additional days to program exercise sessions. Add 2 additional days to program exercise sessions.   Initial Home Exercises Provided -- -- -- 10/03/24 10/03/24     Oxygen   Maintain Oxygen Saturation -- 88% or higher 88% or higher -- 88% or higher      Exercise Comments:   Exercise Comments     Row Name 09/03/24 9076           Exercise Comments First full day of exercise!  Patient was oriented to gym and equipment including functions, settings, policies, and procedures.  Patient's individual exercise prescription and treatment plan were reviewed.   All starting workloads were established based on the results of the 6 minute walk test done at initial orientation visit.  The plan for exercise progression was also introduced and progression will be customized based on patient's performance and goals.          Exercise Goals and Review:   Exercise Goals     Row Name 08/30/24 0921             Exercise Goals   Increase Physical Activity Yes       Intervention Provide advice, education, support and counseling about physical activity/exercise needs.;Develop an individualized exercise prescription for aerobic and resistive training based on  initial evaluation findings, risk stratification, comorbidities and participant's personal goals.       Expected Outcomes Short Term: Attend rehab on a regular basis to increase amount of physical activity.;Long Term: Add in home exercise to make exercise part of routine and to increase amount of physical activity.;Long Term: Exercising regularly at least 3-5 days a week.       Able to understand and use rate of perceived exertion (RPE) scale Yes       Intervention Provide education and explanation on how to use RPE scale       Expected Outcomes Short Term: Able to use RPE daily in rehab to express subjective intensity level;Long Term:  Able to use RPE to guide intensity level when exercising independently       Able to understand and use Dyspnea scale Yes       Intervention Provide education and explanation on how to use Dyspnea scale       Expected Outcomes Short Term: Able to use Dyspnea scale daily in rehab to express subjective sense of shortness of breath during exertion;Long Term: Able to use Dyspnea scale to guide intensity level when exercising independently       Knowledge and understanding of Target Heart Rate Range (THRR) Yes       Intervention Provide education and explanation of THRR including how the numbers were predicted and where they are located for reference       Expected Outcomes Short  Term: Able to state/look up THRR;Short Term: Able to use daily as guideline for intensity in rehab;Long Term: Able to use THRR to govern intensity when exercising independently       Able to check pulse independently Yes       Intervention Provide education and demonstration on how to check pulse in carotid and radial arteries.;Review the importance of being able to check your own pulse for safety during independent exercise       Expected Outcomes Short Term: Able to explain why pulse checking is important during independent exercise;Long Term: Able to check pulse independently and accurately       Understanding of Exercise Prescription Yes       Intervention Provide education, explanation, and written materials on patient's individual exercise prescription       Expected Outcomes Short Term: Able to explain program exercise prescription;Long Term: Able to explain home exercise prescription to exercise independently          Exercise Goals Re-Evaluation :  Exercise Goals Re-Evaluation     Row Name 09/03/24 9076 09/10/24 1141 09/25/24 1416 10/03/24 0935 10/11/24 1654     Exercise Goal Re-Evaluation   Exercise Goals Review Increase Physical Activity;Able to understand and use rate of perceived exertion (RPE) scale;Knowledge and understanding of Target Heart Rate Range (THRR);Understanding of Exercise Prescription;Increase Strength and Stamina;Able to understand and use Dyspnea scale;Able to check pulse independently Increase Physical Activity;Increase Strength and Stamina;Understanding of Exercise Prescription Increase Physical Activity;Increase Strength and Stamina;Understanding of Exercise Prescription Increase Physical Activity;Able to understand and use Dyspnea scale;Understanding of Exercise Prescription;Increase Strength and Stamina;Knowledge and understanding of Target Heart Rate Range (THRR);Able to check pulse independently;Able to understand and use rate of perceived exertion (RPE) scale  Increase Physical Activity;Increase Strength and Stamina;Understanding of Exercise Prescription   Comments Reviewed RPE and dyspnea scale, THR and program prescription with pt today.  Pt voiced understanding and was given a copy of goals to take home. Anne Ross is off to a great start in the program, and  was able to attend her first 3 sessions during this review period. During thses sessions she was able to use the T5 nustep at level 1 and the recumbent bike at level 2. We will continue to monitor her progress in the program. Anne Ross is doing well in the program. She recently increased her treadmill workload to a speed of 2 mph with an incline of 1%. She also improved to level 2 on the T5 nustep. We will continue to monitor her progress in the program. Reviewed home exercise with pt today.  Pt plans to use her treadmill at home and 3lb handweights for exercise. She plans to add 2 additional days at home.  Reviewed THR, pulse, RPE, sign and symptoms, pulse oximetery and when to call 911 or MD.  Also discussed weather considerations and indoor options.  Pt voiced understanding. Anne Ross is doing well in rehab. She has been able to increase her speed on the treadmill from 2 to 2. and no incline. She was also able to increase from level 1 to 2 on the recumbent bike. We will continue to monitor her progress in the program.   Expected Outcomes Short: Use RPE daily to regulate intensity. Long: Follow program prescription in THR. Short: Continue to follow exercise prescription. Long: Continue exercise to improve strength and stamina. Short: Continue to progressively increase workloads. Long: Continue exercise to improve strength and stamina. Short: Add 2 additional days of exercise at home. Long: Continue to exercise independently. Short: Continue to increase treadmill workload. Long: Continune exercise to improve strength and stamina.      Discharge Exercise Prescription (Final Exercise Prescription Changes):  Exercise  Prescription Changes - 10/11/24 1600       Response to Exercise   Blood Pressure (Admit) 122/60    Blood Pressure (Exercise) 148/58    Blood Pressure (Exit) 112/56    Heart Rate (Admit) 83 bpm    Heart Rate (Exercise) 120 bpm    Heart Rate (Exit) 86 bpm    Rating of Perceived Exertion (Exercise) 13    Symptoms none    Duration Continue with 30 min of aerobic exercise without signs/symptoms of physical distress.    Intensity THRR unchanged      Progression   Progression Continue to progress workloads to maintain intensity without signs/symptoms of physical distress.    Average METs 2.4      Resistance Training   Training Prescription Yes    Weight 2 lb    Reps 10-15      Interval Training   Interval Training No      Treadmill   MPH 2.2    Grade 1    Minutes 15    METs 2.99      Recumbant Bike   Level 2    Watts 20    Minutes 15    METs 2.91      NuStep   Level 2    Minutes 15    METs 2.4      T5 Nustep   Level 2    Minutes 15    METs 2      Home Exercise Plan   Plans to continue exercise at Home (comment)   Treadmill at home and 3 lb handweights   Frequency Add 2 additional days to program exercise sessions.    Initial Home Exercises Provided 10/03/24      Oxygen   Maintain Oxygen Saturation 88% or higher          Nutrition:  Target Goals: Understanding of nutrition guidelines, daily intake of sodium 1500mg , cholesterol 200mg , calories 30% from fat and 7% or less from saturated fats, daily to have 5 or more servings of fruits and vegetables.  Education: Nutrition 1 -Group instruction provided by verbal, written material, interactive activities, discussions, models, and posters to present general guidelines for heart healthy nutrition including macronutrients, label reading, and promoting whole foods over processed counterparts. Education serves as pensions consultant of discussion of heart healthy eating for all. Written material provided at class  time.    Education: Nutrition 2 -Group instruction provided by verbal, written material, interactive activities, discussions, models, and posters to present general guidelines for heart healthy nutrition including sodium, cholesterol, and saturated fat. Providing guidance of habit forming to improve blood pressure, cholesterol, and body weight. Written material provided at class time. Flowsheet Row Cardiac Rehab from 10/10/2024 in [facility] Cardiac and Pulmonary Rehab  Date 09/26/24  Educator jg  Instruction Review Code 1- Verbalizes Understanding      Biometrics:  Pre Biometrics - 08/30/24 0921       Pre Biometrics   Height 5' 4.5 (1.638 m)    Weight 150 lb 9.6 oz (68.3 kg)    Waist Circumference 34 inches    Hip Circumference 39 inches    Waist to Hip Ratio 0.87 %    BMI (Calculated) 25.46    Single Leg Stand 10.15 seconds           Nutrition Therapy Plan and Nutrition Goals:  Nutrition Therapy & Goals - 08/30/24 0918       Nutrition Therapy   RD appointment deferred Yes      Intervention Plan   Intervention Prescribe, educate and counsel regarding individualized specific dietary modifications aiming towards targeted core components such as weight, hypertension, lipid management, diabetes, heart failure and other comorbidities.    Expected Outcomes Short Term Goal: Understand basic principles of dietary content, such as calories, fat, sodium, cholesterol and nutrients.;Short Term Goal: A plan has been developed with personal nutrition goals set during dietitian appointment.;Long Term Goal: Adherence to prescribed nutrition plan.          Nutrition Assessments:  MEDIFICTS Score Key: >=70 Need to make dietary changes  40-70 Heart Healthy Diet <= 40 Therapeutic Level Cholesterol Diet  Flowsheet Row Cardiac Rehab from 08/30/2024 in [facility] Cardiac and Pulmonary Rehab  Picture Your Plate Total Score on Admission 68   Picture Your Plate Scores: <59 Unhealthy dietary  pattern with much room for improvement. 41-50 Dietary pattern unlikely to meet recommendations for good health and room for improvement. 51-60 More healthful dietary pattern, with some room for improvement.  >60 Healthy dietary pattern, although there may be some specific behaviors that could be improved.    Nutrition Goals Re-Evaluation:  Nutrition Goals Re-Evaluation     Row Name 10/01/24 0941             Goals   Comment RD appointment deferred.          Nutrition Goals Discharge (Final Nutrition Goals Re-Evaluation):  Nutrition Goals Re-Evaluation - 10/01/24 0941       Goals   Comment RD appointment deferred.          Psychosocial: Target Goals: Acknowledge presence or absence of significant depression and/or stress, maximize coping skills, provide positive support system. Participant is able to verbalize types and ability to use techniques and skills needed for reducing stress and depression.   Education: Stress, Anxiety, and Depression - Group verbal and  visual presentation to define topics covered.  Reviews how body is impacted by stress, anxiety, and depression.  Also discusses healthy ways to reduce stress and to treat/manage anxiety and depression. Written material provided at class time.   Education: Sleep Hygiene -Provides group verbal and written instruction about how sleep can affect your health.  Define sleep hygiene, discuss sleep cycles and impact of sleep habits. Review good sleep hygiene tips.   Initial Review & Psychosocial Screening:  Initial Psych Review & Screening - 08/23/24 1545       Initial Review   Current issues with None Identified      Family Dynamics   Good Support System? Yes   husband and daughter     Barriers   Psychosocial barriers to participate in program There are no identifiable barriers or psychosocial needs.;The patient should benefit from training in stress management and relaxation.      Screening Interventions    Interventions Encouraged to exercise;To provide support and resources with identified psychosocial needs    Expected Outcomes Short Term goal: Utilizing psychosocial counselor, staff and physician to assist with identification of specific Stressors or current issues interfering with healing process. Setting desired goal for each stressor or current issue identified.;Long Term Goal: Stressors or current issues are controlled or eliminated.;Short Term goal: Identification and review with participant of any Quality of Life or Depression concerns found by scoring the questionnaire.;Long Term goal: The participant improves quality of Life and PHQ9 Scores as seen by post scores and/or verbalization of changes          Quality of Life Scores:   Quality of Life - 08/30/24 0917       Quality of Life   Select Quality of Life      Quality of Life Scores   Health/Function Pre 24.5 %    Socioeconomic Pre 26.25 %    Psych/Spiritual Pre 30 %    Family Pre 28.8 %    GLOBAL Pre 26.61 %         Scores of 19 and below usually indicate a poorer quality of life in these areas.  A difference of  2-3 points is a clinically meaningful difference.  A difference of 2-3 points in the total score of the Quality of Life Index has been associated with significant improvement in overall quality of life, self-image, physical symptoms, and general health in studies assessing change in quality of life.  PHQ-9: Review Flowsheet  More data may exist      10/01/2024 08/30/2024 03/11/2023 02/04/2023 01/10/2023  Depression screen PHQ 2/9  Decreased Interest 0 1 0 0 0  Down, Depressed, Hopeless 0 1 0 0 0  PHQ - 2 Score 0 2 0 0 0  Altered sleeping 0 0 0 0 0  Tired, decreased energy 2 3 1 1 3   Change in appetite 0 1 0 0 2  Feeling bad or failure about yourself  0 0 0 0 0  Trouble concentrating 0 0 0 0 0  Moving slowly or fidgety/restless 1 1 0 0 2  Suicidal thoughts 0 0 0 0 0  PHQ-9 Score 3  7  1  1  7    Difficult doing  work/chores Not difficult at all Somewhat difficult Not difficult at all Not difficult at all Somewhat difficult    Details       Data saved with a previous flowsheet row definition        Interpretation of Total Score  Total Score Depression  Severity:  1-4 = Minimal depression, 5-9 = Mild depression, 10-14 = Moderate depression, 15-19 = Moderately severe depression, 20-27 = Severe depression   Psychosocial Evaluation and Intervention:  Psychosocial Evaluation - 08/23/24 1548       Psychosocial Evaluation & Interventions   Interventions Encouraged to exercise with the program and follow exercise prescription    Comments Anne Ross attended the program in 2024. She had a cardiac cath 8/18 due to angina and had a follow up cath in 9/17 due to continued angina on exertion. No new stents were placed, however she did start Imdur and stop carvedilol which seems to have decreased her pain. Anne Ross is retired and lives at home with her husband. Her daughter is also staying with her during the day as she reports continued general weakness and difficulty completing ADLs.    Expected Outcomes Short: Attend cardiac and pulmonary rehab for education and exercise. Long: Develop and maintain positive self care habits.    Continue Psychosocial Services  Follow up required by staff          Psychosocial Re-Evaluation:  Psychosocial Re-Evaluation     Row Name 10/01/24 0945             Psychosocial Re-Evaluation   Current issues with None Identified       Comments Reviewed patient health questionnaire (PHQ-9) with patient for follow up. Previously, patients score indicated signs/symptoms of depression.  Reviewed to see if patient is improving symptom wise while in program.  Score improved and patient states that it is because she has more energy and has more stamina.       Expected Outcomes Short: Continue to attend [facility] regularly for regular exercise and social engagement. Long: Continue to  improve symptoms and manage a positive mental state.       Interventions Encouraged to attend Cardiac Rehabilitation for the exercise       Continue Psychosocial Services  Follow up required by staff          Psychosocial Discharge (Final Psychosocial Re-Evaluation):  Psychosocial Re-Evaluation - 10/01/24 0945       Psychosocial Re-Evaluation   Current issues with None Identified    Comments Reviewed patient health questionnaire (PHQ-9) with patient for follow up. Previously, patients score indicated signs/symptoms of depression.  Reviewed to see if patient is improving symptom wise while in program.  Score improved and patient states that it is because she has more energy and has more stamina.    Expected Outcomes Short: Continue to attend [facility] regularly for regular exercise and social engagement. Long: Continue to improve symptoms and manage a positive mental state.    Interventions Encouraged to attend Cardiac Rehabilitation for the exercise    Continue Psychosocial Services  Follow up required by staff          Vocational Rehabilitation: Provide vocational rehab assistance to qualifying candidates.   Vocational Rehab Evaluation & Intervention:  Vocational Rehab - 08/23/24 1544       Initial Vocational Rehab Evaluation & Intervention   Assessment shows need for Vocational Rehabilitation No      Vocational Rehab Re-Evaulation   Comments retired          Education: Education Goals: Education classes will be provided on a variety of topics geared toward better understanding of heart health and risk factor modification. Participant will state understanding/return demonstration of topics presented as noted by education test scores.  Learning Barriers/Preferences:  Learning Barriers/Preferences - 08/23/24 1544  Learning Barriers/Preferences   Learning Barriers None    Learning Preferences None          General Cardiac Education Topics:  AED/CPR: - Group  verbal and written instruction with the use of models to demonstrate the basic use of the AED with the basic ABC's of resuscitation.   Test and Procedures: - Group verbal and visual presentation and models provide information about basic cardiac anatomy and function. Reviews the testing methods done to diagnose heart disease and the outcomes of the test results. Describes the treatment choices: Medical Management, Angioplasty, or Coronary Bypass Surgery for treating various heart conditions including Myocardial Infarction, Angina, Valve Disease, and Cardiac Arrhythmias. Written material provided at class time. Flowsheet Row Cardiac Rehab from 10/10/2024 in [facility] Cardiac and Pulmonary Rehab  Date 10/03/24  Educator lc  Instruction Review Code 1- Verbalizes Understanding    Medication Safety: - Group verbal and visual instruction to review commonly prescribed medications for heart and lung disease. Reviews the medication, class of the drug, and side effects. Includes the steps to properly store meds and maintain the prescription regimen. Written material provided at class time. Flowsheet Row Cardiac Rehab from 10/10/2024 in [facility] Cardiac and Pulmonary Rehab  Date 10/10/24  Educator KB  Instruction Review Code 1- Verbalizes Understanding    Intimacy: - Group verbal instruction through game format to discuss how heart and lung disease can affect sexual intimacy. Written material provided at class time. Flowsheet Row Cardiac Rehab from 10/10/2024 in [facility] Cardiac and Pulmonary Rehab  Date 09/12/24  Educator nt  Instruction Review Code 1- Tefl Teacher Understanding    Know Your Numbers and Heart Failure: - Group verbal and visual instruction to discuss disease risk factors for cardiac and pulmonary disease and treatment options.  Reviews associated critical values for Overweight/Obesity, Hypertension, Cholesterol, and Diabetes.  Discusses basics of heart failure: signs/symptoms and treatments.   Introduces Heart Failure Zone chart for action plan for heart failure. Written material provided at class time. Flowsheet Row Cardiac Rehab from 03/09/2023 in [facility] Cardiac and Pulmonary Rehab  Date 01/26/23  Educator [facility]  Instruction Review Code 1- Verbalizes Understanding    Infection Prevention: - Provides verbal and written material to individual with discussion of infection control including proper hand washing and proper equipment cleaning during exercise session. Flowsheet Row Cardiac Rehab from 10/10/2024 in [facility] Cardiac and Pulmonary Rehab  Date 08/30/24  Educator NT  Instruction Review Code 1- Verbalizes Understanding    Falls Prevention: - Provides verbal and written material to individual with discussion of falls prevention and safety. Flowsheet Row Cardiac Rehab from 10/10/2024 in [facility] Cardiac and Pulmonary Rehab  Date 08/30/24  Educator NT  Instruction Review Code 1- Verbalizes Understanding    Other: -Provides group and verbal instruction on various topics (see comments) Flowsheet Row Cardiac Rehab from 03/09/2023 in [facility] Cardiac and Pulmonary Rehab  Date 01/12/23  Educator SB  Instruction Review Code 1- Verbalizes Understanding    Knowledge Questionnaire Score:  Knowledge Questionnaire Score - 08/30/24 9082       Knowledge Questionnaire Score   Pre Score 24/26          Core Components/Risk Factors/Patient Goals at Admission:  Personal Goals and Risk Factors at Admission - 08/23/24 1543       Core Components/Risk Factors/Patient Goals on Admission    Weight Management Weight Maintenance;Yes    Intervention Weight Management: Develop a combined nutrition and exercise program designed to reach desired caloric intake, while maintaining appropriate intake of  nutrient and fiber, sodium and fats, and appropriate energy expenditure required for the weight goal.;Weight Management: Provide education and appropriate resources to help participant work on and attain  dietary goals.    Expected Outcomes Short Term: Continue to assess and modify interventions until short term weight is achieved;Long Term: Adherence to nutrition and physical activity/exercise program aimed toward attainment of established weight goal;Understanding recommendations for meals to include 15-35% energy as protein, 25-35% energy from fat, 35-60% energy from carbohydrates, less than 200mg  of dietary cholesterol, 20-35 gm of total fiber daily;Understanding of distribution of calorie intake throughout the day with the consumption of 4-5 meals/snacks;Weight Maintenance: Understanding of the daily nutrition guidelines, which includes 25-35% calories from fat, 7% or less cal from saturated fats, less than 200mg  cholesterol, less than 1.5gm of sodium, & 5 or more servings of fruits and vegetables daily    Hypertension Yes    Intervention Provide education on lifestyle modifcations including regular physical activity/exercise, weight management, moderate sodium restriction and increased consumption of fresh fruit, vegetables, and low fat dairy, alcohol moderation, and smoking cessation.;Monitor prescription use compliance.    Expected Outcomes Short Term: Continued assessment and intervention until BP is < 140/63mm HG in hypertensive participants. < 130/40mm HG in hypertensive participants with diabetes, heart failure or chronic kidney disease.;Long Term: Maintenance of blood pressure at goal levels.    Lipids Yes    Intervention Provide education and support for participant on nutrition & aerobic/resistive exercise along with prescribed medications to achieve LDL 70mg , HDL >40mg .    Expected Outcomes Short Term: Participant states understanding of desired cholesterol values and is compliant with medications prescribed. Participant is following exercise prescription and nutrition guidelines.;Long Term: Cholesterol controlled with medications as prescribed, with individualized exercise RX and with  personalized nutrition plan. Value goals: LDL < 70mg , HDL > 40 mg.          Education:Diabetes - Individual verbal and written instruction to review signs/symptoms of diabetes, desired ranges of glucose level fasting, after meals and with exercise. Acknowledge that pre and post exercise glucose checks will be done for 3 sessions at entry of program.   Core Components/Risk Factors/Patient Goals Review:   Goals and Risk Factor Review     Row Name 10/01/24 0949             Core Components/Risk Factors/Patient Goals Review   Personal Goals Review Weight Management/Obesity;Diabetes       Review Anne Ross states her A1C is 6.9 and she watching her diet. She is maintaing her weight and is 151 pounds. Her blood pressure has been stable in the 120s over 60s. she is checking her blood pressure at home routinely.       Expected Outcomes Short: continue blood pressure readings and maintaining weight. Long: continue HeartTrack to improve overall endurance.          Core Components/Risk Factors/Patient Goals at Discharge (Final Review):   Goals and Risk Factor Review - 10/01/24 0949       Core Components/Risk Factors/Patient Goals Review   Personal Goals Review Weight Management/Obesity;Diabetes    Review Anne Ross states her A1C is 6.9 and she watching her diet. She is maintaing her weight and is 151 pounds. Her blood pressure has been stable in the 120s over 60s. she is checking her blood pressure at home routinely.    Expected Outcomes Short: continue blood pressure readings and maintaining weight. Long: continue HeartTrack to improve overall endurance.          ITP  Comments:  ITP Comments     Row Name 08/23/24 1556 08/30/24 0915 09/03/24 0923 09/19/24 0828 10/17/24 0939   ITP Comments Initial phone call completed. Diagnosis can be found in [facility] 8/18. EP Orientation scheduled for Thursday 08/30/24 at 0800. SABRA Completed and gym orientation for cardiac rehab. Initial ITP created and sent for  review to Dr. Oneil Pinal, Medical Director. First full day of exercise!  Patient was oriented to gym and equipment including functions, settings, policies, and procedures.  Patient's individual exercise prescription and treatment plan were reviewed.  All starting workloads were established based on the results of the 6 minute walk test done at initial orientation visit.  The plan for exercise progression was also introduced and progression will be customized based on patient's performance and goals. 30 Day review completed. Medical Director ITP review done, changes made as directed, and signed approval by Medical Director. New to program. 30 Day review completed. Medical Director ITP review done, changes made as directed, and signed approval by Medical Director.      Comments: 30 Day Review ITP

## 2024-10-17 NOTE — Progress Notes (Signed)
 Daily Session Note  Patient Details  Name: Krishauna Schatzman MRN: [id number] Date of Birth: 06/20/1941 Referring Provider:   Flowsheet Row Cardiac Rehab from 08/30/2024 in [facility] Cardiac and Pulmonary Rehab  Referring Provider Dr. Margery Ruth, MD    Encounter Date: 10/17/2024  Check In:  Session Check In - 10/17/24 0950       Check-In   Supervising physician immediately available to respond to emergencies See telemetry face sheet for immediately available ER MD    Location [facility]-Cardiac & Pulmonary Rehab    Staff Present Rollene Paterson, MS, Exercise Physiologist;Noah Tickle, BS, Exercise Physiologist;Joseph Rolinda NORWOOD HARMAN Tsosie Peggi, RN, DNP, NE-BC;Kelly Bollinger RN,BSN,MPA    Virtual Visit No    Medication changes reported     No    Fall or balance concerns reported    No    Warm-up and Cool-down Performed on first and last piece of equipment    Resistance Training Performed Yes    VAD Patient? No    PAD/SET Patient? No      Pain Assessment   Currently in Pain? No/denies             Social History   Tobacco Use  Smoking Status Never  Smokeless Tobacco Never    Goals Met:  Independence with exercise equipment Exercise tolerated well No report of concerns or symptoms today Strength training completed today  Goals Unmet:  Not Applicable  Comments: Pt able to follow exercise prescription today without complaint.  Will continue to monitor for progression.    Dr. Oneil Pinal is Medical Director for [facility] Cardiac Rehabilitation.  Dr. Fuad Aleskerov is Medical Director for [facility] Pulmonary Rehabilitation.

## 2024-10-19 ENCOUNTER — Encounter

## 2024-10-19 DIAGNOSIS — I214 Non-ST elevation (NSTEMI) myocardial infarction: Secondary | ICD-10-CM | POA: Diagnosis not present

## 2024-10-19 DIAGNOSIS — Z955 Presence of coronary angioplasty implant and graft: Secondary | ICD-10-CM

## 2024-10-19 NOTE — Progress Notes (Signed)
 Daily Session Note  Patient Details  Name: Anne Ross MRN: [id number] Date of Birth: Mar 01, 1941 Referring Provider:   Flowsheet Row Cardiac Rehab from 08/30/2024 in [facility] Cardiac and Pulmonary Rehab  Referring Provider Dr. Margery Ruth, MD    Encounter Date: 10/19/2024  Check In:  Session Check In - 10/19/24 0935       Check-In   Supervising physician immediately available to respond to emergencies See telemetry face sheet for immediately available ER MD    Location [facility]-Cardiac & Pulmonary Rehab    Staff Present Burnard Davenport RN,BSN,MPA;Joseph Hood RCP,RRT,BSRT;Maxon Conetta BS, Exercise Physiologist;Noah Tickle, BS, Exercise Physiologist    Virtual Visit No    Medication changes reported     No    Fall or balance concerns reported    No    Tobacco Cessation No Change    Warm-up and Cool-down Performed on first and last piece of equipment    Resistance Training Performed Yes    VAD Patient? No    PAD/SET Patient? No      Pain Assessment   Currently in Pain? No/denies             Social History   Tobacco Use  Smoking Status Never  Smokeless Tobacco Never    Goals Met:  Independence with exercise equipment Exercise tolerated well No report of concerns or symptoms today Strength training completed today  Goals Unmet:  Not Applicable  Comments: Pt able to follow exercise prescription today without complaint.  Will continue to monitor for progression.    Dr. Oneil Pinal is Medical Director for [facility] Cardiac Rehabilitation.  Dr. Fuad Aleskerov is Medical Director for [facility] Pulmonary Rehabilitation.

## 2024-10-22 ENCOUNTER — Encounter

## 2024-10-22 DIAGNOSIS — I214 Non-ST elevation (NSTEMI) myocardial infarction: Secondary | ICD-10-CM | POA: Diagnosis not present

## 2024-10-22 DIAGNOSIS — Z955 Presence of coronary angioplasty implant and graft: Secondary | ICD-10-CM

## 2024-10-22 NOTE — Progress Notes (Signed)
 Daily Session Note  Patient Details  Name: Anne Ross MRN: [id number] Date of Birth: May 01, 1941 Referring Provider:   Flowsheet Row Cardiac Rehab from 08/30/2024 in [facility] Cardiac and Pulmonary Rehab  Referring Provider Dr. Margery Ruth, MD    Encounter Date: 10/22/2024  Check In:  Session Check In - 10/22/24 0929       Check-In   Supervising physician immediately available to respond to emergencies See telemetry face sheet for immediately available ER MD    Location [facility]-Cardiac & Pulmonary Rehab    Staff Present Burnard Davenport RN,BSN,MPA;Joseph [facility] RCP,RRT,BSRT;Maxon Burnell BS, Exercise Physiologist;Ange Puskas Dyane BS, ACSM CEP, Exercise Physiologist    Virtual Visit No    Medication changes reported     No    Fall or balance concerns reported    No    Tobacco Cessation No Change    Warm-up and Cool-down Performed on first and last piece of equipment    Resistance Training Performed Yes    VAD Patient? No    PAD/SET Patient? No      Pain Assessment   Currently in Pain? No/denies             Social History   Tobacco Use  Smoking Status Never  Smokeless Tobacco Never    Goals Met:  Independence with exercise equipment Exercise tolerated well No report of concerns or symptoms today Strength training completed today  Goals Unmet:  Not Applicable  Comments: Pt able to follow exercise prescription today without complaint.  Will continue to monitor for progression.    Dr. Oneil Pinal is Medical Director for [facility] Cardiac Rehabilitation.  Dr. Fuad Aleskerov is Medical Director for [facility] Pulmonary Rehabilitation.

## 2024-10-24 ENCOUNTER — Encounter

## 2024-10-24 DIAGNOSIS — Z955 Presence of coronary angioplasty implant and graft: Secondary | ICD-10-CM

## 2024-10-24 DIAGNOSIS — I214 Non-ST elevation (NSTEMI) myocardial infarction: Secondary | ICD-10-CM | POA: Diagnosis not present

## 2024-10-24 NOTE — Progress Notes (Signed)
 Daily Session Note  Patient Details  Name: Anne Ross MRN: [id number] Date of Birth: September 19, 1941 Referring Provider:   Flowsheet Row Cardiac Rehab from 08/30/2024 in [facility] Cardiac and Pulmonary Rehab  Referring Provider Dr. Margery Ruth, MD    Encounter Date: 10/24/2024  Check In:  Session Check In - 10/24/24 0924       Check-In   Supervising physician immediately available to respond to emergencies See telemetry face sheet for immediately available ER MD    Location [facility]-Cardiac & Pulmonary Rehab    Staff Present Burnard Davenport RN,BSN,MPA;Maxon Conetta BS, Exercise Physiologist;Laura Cates RN,BSN;Margaret Best, MS, Exercise Physiologist    Virtual Visit No    Medication changes reported     No    Fall or balance concerns reported    No    Tobacco Cessation No Change    Warm-up and Cool-down Performed on first and last piece of equipment    Resistance Training Performed Yes    VAD Patient? No    PAD/SET Patient? No      Pain Assessment   Currently in Pain? No/denies             Social History   Tobacco Use  Smoking Status Never  Smokeless Tobacco Never    Goals Met:  Independence with exercise equipment Exercise tolerated well No report of concerns or symptoms today Strength training completed today  Goals Unmet:  Not Applicable  Comments: Pt able to follow exercise prescription today without complaint.  Will continue to monitor for progression.    Dr. Oneil Pinal is Medical Director for [facility] Cardiac Rehabilitation.  Dr. Fuad Aleskerov is Medical Director for [facility] Pulmonary Rehabilitation.

## 2024-10-29 ENCOUNTER — Encounter: Attending: Cardiology

## 2024-10-29 DIAGNOSIS — Z955 Presence of coronary angioplasty implant and graft: Secondary | ICD-10-CM | POA: Diagnosis present

## 2024-10-29 DIAGNOSIS — Z9861 Coronary angioplasty status: Secondary | ICD-10-CM | POA: Insufficient documentation

## 2024-10-29 NOTE — Progress Notes (Signed)
 Daily Session Note  Patient Details  Name: Anne Ross MRN: [id number] Date of Birth: Mar 02, 1941 Referring Provider:   Flowsheet Row Cardiac Rehab from 08/30/2024 in [facility] Cardiac and Pulmonary Rehab  Referring Provider Dr. Margery Ruth, MD    Encounter Date: 10/29/2024  Check In:  Session Check In - 10/29/24 0853       Check-In   Supervising physician immediately available to respond to emergencies See telemetry face sheet for immediately available ER MD    Location [facility]-Cardiac & Pulmonary Rehab    Staff Present Burnard Davenport RN,BSN,MPA;Joseph [facility] RCP,RRT,BSRT;Maxon Burnell BS, Exercise Physiologist;Quinisha Mould Dyane BS, ACSM CEP, Exercise Physiologist    Virtual Visit No    Medication changes reported     No    Fall or balance concerns reported    No    Tobacco Cessation No Change    Warm-up and Cool-down Performed on first and last piece of equipment    Resistance Training Performed Yes    VAD Patient? No    PAD/SET Patient? No      Pain Assessment   Currently in Pain? No/denies             Social History   Tobacco Use  Smoking Status Never  Smokeless Tobacco Never    Goals Met:  Independence with exercise equipment Exercise tolerated well No report of concerns or symptoms today Strength training completed today  Goals Unmet:  Not Applicable  Comments: Pt able to follow exercise prescription today without complaint.  Will continue to monitor for progression.    Dr. Oneil Pinal is Medical Director for [facility] Cardiac Rehabilitation.  Dr. Fuad Aleskerov is Medical Director for [facility] Pulmonary Rehabilitation.

## 2024-10-31 ENCOUNTER — Encounter: Admitting: Emergency Medicine

## 2024-10-31 DIAGNOSIS — Z9861 Coronary angioplasty status: Secondary | ICD-10-CM

## 2024-10-31 DIAGNOSIS — Z955 Presence of coronary angioplasty implant and graft: Secondary | ICD-10-CM | POA: Diagnosis not present

## 2024-10-31 NOTE — Progress Notes (Signed)
 Daily Session Note  Patient Details  Name: Anne Ross MRN: [id number] Date of Birth: 1941/03/25 Referring Provider:   Flowsheet Row Cardiac Rehab from 08/30/2024 in [facility] Cardiac and Pulmonary Rehab  Referring Provider Dr. Margery Ruth, MD    Encounter Date: 10/31/2024  Check In:  Session Check In - 10/31/24 1017       Check-In   Supervising physician immediately available to respond to emergencies See telemetry face sheet for immediately available ER MD    Location [facility]-Cardiac & Pulmonary Rehab    Staff Present Leita Franks RN,BSN;Joseph [facility] BS, Exercise Physiologist;Denise Rhew PhD, RN,CNS,CEN    Virtual Visit No    Medication changes reported     No    Fall or balance concerns reported    No    Tobacco Cessation No Change    Warm-up and Cool-down Performed on first and last piece of equipment    Resistance Training Performed Yes    VAD Patient? No    PAD/SET Patient? No      Pain Assessment   Currently in Pain? No/denies             Social History   Tobacco Use  Smoking Status Never  Smokeless Tobacco Never    Goals Met:  Independence with exercise equipment Exercise tolerated well No report of concerns or symptoms today Strength training completed today  Goals Unmet:  Not Applicable  Comments: Pt able to follow exercise prescription today without complaint.  Will continue to monitor for progression.    Dr. Oneil Pinal is Medical Director for [facility] Cardiac Rehabilitation.  Dr. Fuad Aleskerov is Medical Director for [facility] Pulmonary Rehabilitation.

## 2024-11-02 ENCOUNTER — Encounter

## 2024-11-05 ENCOUNTER — Encounter

## 2024-11-05 DIAGNOSIS — Z955 Presence of coronary angioplasty implant and graft: Secondary | ICD-10-CM

## 2024-11-05 NOTE — Progress Notes (Signed)
 Daily Session Note  Patient Details  Name: Danijah Noh MRN: [id number] Date of Birth: December 19, 1940 Referring Provider:   Flowsheet Row Cardiac Rehab from 08/30/2024 in [facility] Cardiac and Pulmonary Rehab  Referring Provider Dr. Margery Ruth, MD    Encounter Date: 11/05/2024  Check In:  Session Check In - 11/05/24 0901       Check-In   Supervising physician immediately available to respond to emergencies See telemetry face sheet for immediately available ER MD    Location [facility]-Cardiac & Pulmonary Rehab    Staff Present Burnard Davenport RN,BSN,MPA;Maxon Burnell BS, Exercise Physiologist;Joseph [facility] BS, ACSM CEP, Exercise Physiologist    Virtual Visit No    Medication changes reported     No    Fall or balance concerns reported    No    Tobacco Cessation No Change    Warm-up and Cool-down Performed on first and last piece of equipment    Resistance Training Performed Yes    VAD Patient? No    PAD/SET Patient? No      Pain Assessment   Currently in Pain? No/denies             Social History   Tobacco Use  Smoking Status Never  Smokeless Tobacco Never    Goals Met:  Proper associated with RPD/PD & O2 Sat Independence with exercise equipment Exercise tolerated well Personal goals reviewed No report of concerns or symptoms today Strength training completed today  Goals Unmet:  Not Applicable  Comments: Pt able to follow exercise prescription today without complaint.  Will continue to monitor for progression.    Dr. Oneil Pinal is Medical Director for [facility] Cardiac Rehabilitation.  Dr. Fuad Aleskerov is Medical Director for [facility] Pulmonary Rehabilitation.

## 2024-11-07 ENCOUNTER — Encounter

## 2024-11-07 VITALS — Ht 64.5 in | Wt 149.8 lb

## 2024-11-07 DIAGNOSIS — Z955 Presence of coronary angioplasty implant and graft: Secondary | ICD-10-CM | POA: Diagnosis not present

## 2024-11-07 NOTE — Progress Notes (Signed)
 Daily Session Note  Patient Details  Name: Anne Ross MRN: [id number] Date of Birth: 20-Jun-1941 Referring Provider:   Flowsheet Row Cardiac Rehab from 08/30/2024 in [facility] Cardiac and Pulmonary Rehab  Referring Provider Dr. Margery Ruth, MD    Encounter Date: 11/07/2024  Check In:  Session Check In - 11/07/24 0930       Check-In   Supervising physician immediately available to respond to emergencies See telemetry face sheet for immediately available ER MD    Location [facility]-Cardiac & Pulmonary Rehab    Staff Present Burnard Davenport RN,BSN,MPA;Maxon Conetta BS, Exercise Physiologist;Joseph Rolinda RCP,RRT,BSRT;Laura Cates RN,BSN    Virtual Visit No    Medication changes reported     No    Fall or balance concerns reported    No    Tobacco Cessation No Change    Warm-up and Cool-down Performed on first and last piece of equipment    Resistance Training Performed Yes    VAD Patient? No    PAD/SET Patient? No      Pain Assessment   Currently in Pain? No/denies             Social History   Tobacco Use  Smoking Status Never  Smokeless Tobacco Never    Goals Met:  Independence with exercise equipment Exercise tolerated well No report of concerns or symptoms today Strength training completed today  Goals Unmet:  Not Applicable  Comments: Pt able to follow exercise prescription today without complaint.  Will continue to monitor for progression.    Dr. Oneil Pinal is Medical Director for [facility] Cardiac Rehabilitation.  Dr. Fuad Aleskerov is Medical Director for [facility] Pulmonary Rehabilitation.

## 2024-11-07 NOTE — Patient Instructions (Signed)
 Discharge Patient Instructions  Patient Details  Name: Anne Ross MRN: [id number] Date of Birth: 04/26/1941 Referring Provider:  Jeffie Cheryl BRAVO, MD   Number of Visits: 62  Reason for Discharge:  Patient reached a stable level of exercise. Patient independent in their exercise. Patient has met program and personal goals.  Diagnosis:  Status post coronary artery stent placement  Initial Exercise Prescription:  Initial Exercise Prescription - 08/30/24 0900       Date of Initial Exercise RX and Referring Provider   Date 08/30/24    Referring Provider Dr. Margery Ruth, MD      Oxygen   Maintain Oxygen Saturation 88% or higher      Treadmill   MPH 1.8    Grade 0    Minutes 15    METs 2.38      Recumbant Bike   Level 2    RPM 50    Watts 15    Minutes 15    METs 1.98      NuStep   Level 2   T6 nustep   SPM 80    Minutes 15    METs 1.98      T5 Nustep   Level 1    SPM 80    Minutes 15    METs 1.98      Prescription Details   Frequency (times per week) 3    Duration Progress to 30 minutes of continuous aerobic without signs/symptoms of physical distress      Intensity   THRR 40-80% of Max Heartrate 108-128    Ratings of Perceived Exertion 11-13    Perceived Dyspnea 0-4      Progression   Progression Continue to progress workloads to maintain intensity without signs/symptoms of physical distress.      Resistance Training   Training Prescription Yes    Weight 2 lb    Reps 10-15          Discharge Exercise Prescription (Final Exercise Prescription Changes):  Exercise Prescription Changes - 10/23/24 1300       Response to Exercise   Blood Pressure (Admit) 102/68    Blood Pressure (Exit) 118/62    Heart Rate (Admit) 89 bpm    Heart Rate (Exercise) 119 bpm    Heart Rate (Exit) 90 bpm    Rating of Perceived Exertion (Exercise) 14    Symptoms none    Duration Continue with 30 min of aerobic exercise without signs/symptoms of physical  distress.    Intensity THRR unchanged      Progression   Progression Continue to progress workloads to maintain intensity without signs/symptoms of physical distress.    Average METs 2.63      Resistance Training   Training Prescription Yes    Weight 2 lb    Reps 10-15      Interval Training   Interval Training No      Treadmill   MPH 2.2    Grade 1.5    Minutes 15    METs 3.14      Recumbant Bike   Level 2    Watts 20    Minutes 15    METs 2.92      NuStep   Level 3   T6 Level 2   Minutes 15    METs 2.6      T5 Nustep   Level 3    Minutes 15    METs 1.9      Biostep-RELP   Level  3    SPM 50    Minutes 15    METs 2.6      Home Exercise Plan   Plans to continue exercise at Home (comment)   Treadmill at home and 3 lb handweights   Frequency Add 2 additional days to program exercise sessions.    Initial Home Exercises Provided 10/03/24      Oxygen   Maintain Oxygen Saturation 88% or higher          Functional Capacity:  6 Minute Walk     Row Name 08/30/24 0919 11/07/24 0937       6 Minute Walk   Phase Initial Discharge    Distance 950 feet 1225 feet    Distance % Change -- 28.95 %    Distance Feet Change -- 275 ft    Walk Time 6 minutes 6 minutes    # of Rest Breaks 0 0    MPH 1.8 2.32    METS 1.98 2.53    RPE 13 14    Perceived Dyspnea  1 2    VO2 Peak 6.93 8.84    Symptoms No No    Resting HR 88 bpm 85 bpm    Resting BP 132/52 128/58    Resting Oxygen Saturation  97 % 99 %    Exercise Oxygen Saturation  during 6 min walk 97 % 97 %    Max Ex. HR 107 bpm 108 bpm    Max Ex. BP 158/54 164/60    2 Minute Post BP 126/50 130/60      Nutrition & Weight - Outcomes:  Pre Biometrics - 08/30/24 0921       Pre Biometrics   Height 5' 4.5 (1.638 m)    Weight 150 lb 9.6 oz (68.3 kg)    Waist Circumference 34 inches    Hip Circumference 39 inches    Waist to Hip Ratio 0.87 %    BMI (Calculated) 25.46    Single Leg Stand 10.15 seconds           Post Biometrics - 11/07/24 0940        Post  Biometrics   Height 5' 4.5 (1.638 m)    Weight 149 lb 12.8 oz (67.9 kg)    Waist Circumference 35.5 inches    Hip Circumference 40 inches    Waist to Hip Ratio 0.89 %    BMI (Calculated) 25.33    Single Leg Stand 23.1 seconds

## 2024-11-09 ENCOUNTER — Encounter: Admitting: Emergency Medicine

## 2024-11-09 DIAGNOSIS — Z9861 Coronary angioplasty status: Secondary | ICD-10-CM

## 2024-11-09 DIAGNOSIS — Z955 Presence of coronary angioplasty implant and graft: Secondary | ICD-10-CM | POA: Diagnosis not present

## 2024-11-09 NOTE — Progress Notes (Signed)
 Daily Session Note  Patient Details  Name: Anne Ross MRN: [id number] Date of Birth: [birth year] Referring Provider:   Flowsheet Row Cardiac Rehab from 08/30/2024 in [facility] Cardiac and Pulmonary Rehab  Referring Provider Dr. Margery Ruth, MD    Encounter Date: 11/09/2024  Check In:  Session Check In - 11/09/24 0922       Check-In   Supervising physician immediately available to respond to emergencies See telemetry face sheet for immediately available ER MD    Location [facility]-Cardiac & Pulmonary Rehab    Staff Present Fairy Plater RCP,RRT,BSRT;Maxon Conetta BS, Exercise Physiologist;Shawnee Gambone RN,BSN;Noah Tickle, BS, Exercise Physiologist    Virtual Visit No    Medication changes reported     No    Fall or balance concerns reported    No    Tobacco Cessation No Change    Warm-up and Cool-down Performed on first and last piece of equipment    Resistance Training Performed Yes    VAD Patient? No    PAD/SET Patient? No      Pain Assessment   Currently in Pain? No/denies             Tobacco Use History[1]  Goals Met:  Independence with exercise equipment Exercise tolerated well No report of concerns or symptoms today Strength training completed today  Goals Unmet:  Not Applicable  Comments: Pt able to follow exercise prescription today without complaint.  Will continue to monitor for progression.    Dr. Oneil Pinal is Medical Director for [facility] Cardiac Rehabilitation.  Dr. Fuad Aleskerov is Medical Director for [facility] Pulmonary Rehabilitation.    [1]  Social History Tobacco Use  Smoking Status Never  Smokeless Tobacco Never

## 2024-11-12 ENCOUNTER — Encounter

## 2024-11-12 DIAGNOSIS — Z955 Presence of coronary angioplasty implant and graft: Secondary | ICD-10-CM

## 2024-11-12 DIAGNOSIS — Z9861 Coronary angioplasty status: Secondary | ICD-10-CM

## 2024-11-12 NOTE — Progress Notes (Signed)
 Cardiac Individual Treatment Plan  Patient Details  Name: Anne Ross MRN: [id number] Date of Birth: 1941-09-06 Referring Provider:   Flowsheet Row Cardiac Rehab from 08/30/2024 in [facility] Cardiac and Pulmonary Rehab  Referring Provider Dr. Margery Ruth, MD    Initial Encounter Date:  Flowsheet Row Cardiac Rehab from 08/30/2024 in [facility] Cardiac and Pulmonary Rehab  Date 08/30/24    Visit Diagnosis: Status post coronary artery stent placement  History of percutaneous coronary intervention  Patient's Home Medications on Admission: Current Medications[1]  Past Medical History: Past Medical History:  Diagnosis Date   Basal cell carcinoma    chest    Basal cell carcinoma 02/09/2024   left mid dorsal forearm - tx with ED&C   Cancer (HCC)    uterine   History of COVID-19    Hypertension     Tobacco Use: Tobacco Use History[2]  Labs: Review Flowsheet        No data to display           Exercise Target Goals: Exercise Program Goal: Individual exercise prescription set using results from initial 6 min walk test and THRR while considering  patients activity barriers and safety.   Exercise Prescription Goal: Initial exercise prescription builds to 30-45 minutes a day of aerobic activity, 2-3 days per week.  Home exercise guidelines will be given to patient during program as part of exercise prescription that the participant will acknowledge.   Education: Aerobic Exercise: - Group verbal and visual presentation on the components of exercise prescription. Introduces F.I.T.T principle from ACSM for exercise prescriptions.  Reviews F.I.T.T. principles of aerobic exercise including progression. Written material provided at class time. Flowsheet Row Cardiac Rehab from 11/07/2024 in [place] Cardiac and Pulmonary Rehab  Education need identified 08/30/24  Date 09/12/24  Educator nt  Instruction Review Code 1- Tefl Teacher Understanding    Education: Resistance Exercise: -  Group verbal and visual presentation on the components of exercise prescription. Introduces F.I.T.T principle from ACSM for exercise prescriptions  Reviews F.I.T.T. principles of resistance exercise including progression. Written material provided at class time. Flowsheet Row Cardiac Rehab from 11/07/2024 in [facility] Cardiac and Pulmonary Rehab  Date 09/05/24  Educator nt  Instruction Review Code 1- Tefl Teacher Understanding     Education: Exercise & Equipment Safety: - Individual verbal instruction and demonstration of equipment use and safety with use of the equipment. Flowsheet Row Cardiac Rehab from 11/07/2024 in [facility] Cardiac and Pulmonary Rehab  Date 08/30/24  Educator NT  Instruction Review Code 1- Verbalizes Understanding    Education: Exercise Physiology & General Exercise Guidelines: - Group verbal and written instruction with models to review the exercise physiology of the cardiovascular system and associated critical values. Provides general exercise guidelines with specific guidelines to those with heart or lung disease. Written material provided at class time. Flowsheet Row Cardiac Rehab from 11/07/2024 in [facility] Cardiac and Pulmonary Rehab  Date 11/07/24  Educator nt  Instruction Review Code 1- Tefl Teacher Understanding    Education: Flexibility, Balance, Mind/Body Relaxation: - Group verbal and visual presentation with interactive activity on the components of exercise prescription. Introduces F.I.T.T principle from ACSM for exercise prescriptions. Reviews F.I.T.T. principles of flexibility and balance exercise training including progression. Also discusses the mind body connection.  Reviews various relaxation techniques to help reduce and manage stress (i.e. Deep breathing, progressive muscle relaxation, and visualization). Balance handout provided to take home. Written material provided at class time. Flowsheet Row Cardiac Rehab from 11/07/2024 in [facility] Cardiac and Pulmonary Rehab  Date 09/05/24  Educator nt  Instruction Review Code 1- Verbalizes Understanding    Activity Barriers & Risk Stratification:  Activity Barriers & Cardiac Risk Stratification - 08/30/24 0920       Activity Barriers & Cardiac Risk Stratification   Activity Barriers Balance Concerns;Muscular Weakness;Arthritis;Other (comment);Fibromyalgia    Comments gout    Cardiac Risk Stratification Moderate          6 Minute Walk:  6 Minute Walk     Row Name 08/30/24 0919 11/07/24 0937       6 Minute Walk   Phase Initial Discharge    Distance 950 feet 1225 feet    Distance % Change -- 28.95 %    Distance Feet Change -- 275 ft    Walk Time 6 minutes 6 minutes    # of Rest Breaks 0 0    MPH 1.8 2.32    METS 1.98 2.53    RPE 13 14    Perceived Dyspnea  1 2    VO2 Peak 6.93 8.84    Symptoms No No    Resting HR 88 bpm 85 bpm    Resting BP 132/52 128/58    Resting Oxygen Saturation  97 % 99 %    Exercise Oxygen Saturation  during 6 min walk 97 % 97 %    Max Ex. HR 107 bpm 108 bpm    Max Ex. BP 158/54 164/60    2 Minute Post BP 126/50 130/60       Oxygen Initial Assessment:   Oxygen Re-Evaluation:   Oxygen Discharge (Final Oxygen Re-Evaluation):   Initial Exercise Prescription:  Initial Exercise Prescription - 08/30/24 0900       Date of Initial Exercise RX and Referring Provider   Date 08/30/24    Referring Provider Dr. Margery Ruth, MD      Oxygen   Maintain Oxygen Saturation 88% or higher      Treadmill   MPH 1.8    Grade 0    Minutes 15    METs 2.38      Recumbant Bike   Level 2    RPM 50    Watts 15    Minutes 15    METs 1.98      NuStep   Level 2   T6 nustep   SPM 80    Minutes 15    METs 1.98      T5 Nustep   Level 1    SPM 80    Minutes 15    METs 1.98      Prescription Details   Frequency (times per week) 3    Duration Progress to 30 minutes of continuous aerobic without signs/symptoms of physical distress      Intensity   THRR 40-80%  of Max Heartrate 108-128    Ratings of Perceived Exertion 11-13    Perceived Dyspnea 0-4      Progression   Progression Continue to progress workloads to maintain intensity without signs/symptoms of physical distress.      Resistance Training   Training Prescription Yes    Weight 2 lb    Reps 10-15          Perform Capillary Blood Glucose checks as needed.  Exercise Prescription Changes:   Exercise Prescription Changes     Row Name 08/30/24 0900 09/10/24 1100 09/25/24 1400 10/03/24 0900 10/11/24 1600     Response to Exercise   Blood Pressure (Admit) 132/52 128/70 132/78 -- 122/60  Blood Pressure (Exercise) 158/54 162/76 164/80 -- 148/58   Blood Pressure (Exit) 126/50 124/52 132/80 -- 112/56   Heart Rate (Admit) 88 bpm 74 bpm 82 bpm -- 83 bpm   Heart Rate (Exercise) 107 bpm 115 bpm 119 bpm -- 120 bpm   Heart Rate (Exit) 90 bpm 96 bpm 92 bpm -- 86 bpm   Oxygen Saturation (Admit) 97 % -- -- -- --   Oxygen Saturation (Exercise) 97 % -- -- -- --   Rating of Perceived Exertion (Exercise) 13 15 14  -- 13   Perceived Dyspnea (Exercise) 1 0 1 -- --   Symptoms none none none -- none   Comments Results first 2 weeks of exercise -- -- --   Duration -- Progress to 30 minutes of  aerobic without signs/symptoms of physical distress Continue with 30 min of aerobic exercise without signs/symptoms of physical distress. -- Continue with 30 min of aerobic exercise without signs/symptoms of physical distress.   Intensity -- THRR unchanged THRR unchanged -- THRR unchanged     Progression   Progression -- Continue to progress workloads to maintain intensity without signs/symptoms of physical distress. Continue to progress workloads to maintain intensity without signs/symptoms of physical distress. -- Continue to progress workloads to maintain intensity without signs/symptoms of physical distress.   Average METs -- 2.3 2.42 -- 2.4     Resistance Training   Training Prescription -- Yes Yes  -- Yes   Weight -- 2lb 2 lb -- 2 lb   Reps -- 10-15 10-15 -- 10-15     Interval Training   Interval Training -- No No -- No     Treadmill   MPH -- -- 2 -- 2.2   Grade -- -- 1 -- 1   Minutes -- -- 15 -- 15   METs -- -- 2.81 -- 2.99     Recumbant Bike   Level -- 2 1 -- 2   Watts -- 19 18 -- 20   Minutes -- 15 15 -- 15   METs -- 2.87 2.88 -- 2.91     NuStep   Level -- -- 2  T6 nustep -- 2   Minutes -- -- 15 -- 15   METs -- -- 1.9 -- 2.4     T5 Nustep   Level -- 1 2 -- 2   Minutes -- 15 15 -- 15   METs -- 2 1.9 -- 2     Home Exercise Plan   Plans to continue exercise at -- -- -- Home (comment)  Treadmill at home and 3 lb handweights Home (comment)  Treadmill at home and 3 lb handweights   Frequency -- -- -- Add 2 additional days to program exercise sessions. Add 2 additional days to program exercise sessions.   Initial Home Exercises Provided -- -- -- 10/03/24 10/03/24     Oxygen   Maintain Oxygen Saturation -- 88% or higher 88% or higher -- 88% or higher    Row Name 10/23/24 1300 11/07/24 1600           Response to Exercise   Blood Pressure (Admit) 102/68 128/56      Blood Pressure (Exercise) -- 130/58      Blood Pressure (Exit) 118/62 102/60      Heart Rate (Admit) 89 bpm 83 bpm      Heart Rate (Exercise) 119 bpm 125 bpm      Heart Rate (Exit) 90 bpm 84 bpm      Rating  of Perceived Exertion (Exercise) 14 12      Symptoms none none      Duration Continue with 30 min of aerobic exercise without signs/symptoms of physical distress. Continue with 30 min of aerobic exercise without signs/symptoms of physical distress.      Intensity THRR unchanged THRR unchanged        Progression   Progression Continue to progress workloads to maintain intensity without signs/symptoms of physical distress. Continue to progress workloads to maintain intensity without signs/symptoms of physical distress.      Average METs 2.63 2.7        Resistance Training   Training Prescription  Yes Yes      Weight 2 lb 2 lb      Reps 10-15 10-15        Interval Training   Interval Training No No        Treadmill   MPH 2.2 2.2      Grade 1.5 1      Minutes 15 15      METs 3.14 2.99        Recumbant Bike   Level 2 --      Watts 20 --      Minutes 15 --      METs 2.92 --        NuStep   Level 3  T6 Level 2 3      Minutes 15 15      METs 2.6 2.9        T5 Nustep   Level 3 --      Minutes 15 --      METs 1.9 --        Biostep-RELP   Level 3 --      SPM 50 --      Minutes 15 --      METs 2.6 --        Home Exercise Plan   Plans to continue exercise at Home (comment)  Treadmill at home and 3 lb handweights Home (comment)  Treadmill at home and 3 lb handweights      Frequency Add 2 additional days to program exercise sessions. Add 2 additional days to program exercise sessions.      Initial Home Exercises Provided 10/03/24 10/03/24        Oxygen   Maintain Oxygen Saturation 88% or higher 88% or higher         Exercise Comments:   Exercise Comments     Row Name 09/03/24 9076 11/12/24 0945         Exercise Comments First full day of exercise!  Patient was oriented to gym and equipment including functions, settings, policies, and procedures.  Patient's individual exercise prescription and treatment plan were reviewed.  All starting workloads were established based on the results of the 6 minute walk test done at initial orientation visit.  The plan for exercise progression was also introduced and progression will be customized based on patient's performance and goals. Anne Ross graduated today from  rehab with 36 sessions completed.  Details of the patient's exercise prescription and what She needs to do in order to continue the prescription and progress were discussed with patient.  Patient was given a copy of prescription and goals.  Patient verbalized understanding. Anne Ross plans to continue to exercise by walking at home.         Exercise Goals and  Review:   Exercise Goals     Row Name 08/30/24 [phone]  Exercise Goals   Increase Physical Activity Yes       Intervention Provide advice, education, support and counseling about physical activity/exercise needs.;Develop an individualized exercise prescription for aerobic and resistive training based on initial evaluation findings, risk stratification, comorbidities and participant's personal goals.       Expected Outcomes Short Term: Attend rehab on a regular basis to increase amount of physical activity.;Long Term: Add in home exercise to make exercise part of routine and to increase amount of physical activity.;Long Term: Exercising regularly at least 3-5 days a week.       Able to understand and use rate of perceived exertion (RPE) scale Yes       Intervention Provide education and explanation on how to use RPE scale       Expected Outcomes Short Term: Able to use RPE daily in rehab to express subjective intensity level;Long Term:  Able to use RPE to guide intensity level when exercising independently       Able to understand and use Dyspnea scale Yes       Intervention Provide education and explanation on how to use Dyspnea scale       Expected Outcomes Short Term: Able to use Dyspnea scale daily in rehab to express subjective sense of shortness of breath during exertion;Long Term: Able to use Dyspnea scale to guide intensity level when exercising independently       Knowledge and understanding of Target Heart Rate Range (THRR) Yes       Intervention Provide education and explanation of THRR including how the numbers were predicted and where they are located for reference       Expected Outcomes Short Term: Able to state/look up THRR;Short Term: Able to use daily as guideline for intensity in rehab;Long Term: Able to use THRR to govern intensity when exercising independently       Able to check pulse independently Yes       Intervention Provide education and demonstration on how  to check pulse in carotid and radial arteries.;Review the importance of being able to check your own pulse for safety during independent exercise       Expected Outcomes Short Term: Able to explain why pulse checking is important during independent exercise;Long Term: Able to check pulse independently and accurately       Understanding of Exercise Prescription Yes       Intervention Provide education, explanation, and written materials on patient's individual exercise prescription       Expected Outcomes Short Term: Able to explain program exercise prescription;Long Term: Able to explain home exercise prescription to exercise independently          Exercise Goals Re-Evaluation :  Exercise Goals Re-Evaluation     Row Name 09/03/24 9076 09/10/24 1141 09/25/24 1416 10/03/24 0935 10/11/24 1654     Exercise Goal Re-Evaluation   Exercise Goals Review Increase Physical Activity;Able to understand and use rate of perceived exertion (RPE) scale;Knowledge and understanding of Target Heart Rate Range (THRR);Understanding of Exercise Prescription;Increase Strength and Stamina;Able to understand and use Dyspnea scale;Able to check pulse independently Increase Physical Activity;Increase Strength and Stamina;Understanding of Exercise Prescription Increase Physical Activity;Increase Strength and Stamina;Understanding of Exercise Prescription Increase Physical Activity;Able to understand and use Dyspnea scale;Understanding of Exercise Prescription;Increase Strength and Stamina;Knowledge and understanding of Target Heart Rate Range (THRR);Able to check pulse independently;Able to understand and use rate of perceived exertion (RPE) scale Increase Physical Activity;Increase Strength and Stamina;Understanding of Exercise Prescription   Comments Reviewed RPE  and dyspnea scale, THR and program prescription with pt today.  Pt voiced understanding and was given a copy of goals to take home. Anne Ross is off to a great start in the  program, and was able to attend her first 3 sessions during this review period. During thses sessions she was able to use the T5 nustep at level 1 and the recumbent bike at level 2. We will continue to monitor her progress in the program. Anne Ross is doing well in the program. She recently increased her treadmill workload to a speed of 2 mph with an incline of 1%. She also improved to level 2 on the T5 nustep. We will continue to monitor her progress in the program. Reviewed home exercise with pt today.  Pt plans to use her treadmill at home and 3lb handweights for exercise. She plans to add 2 additional days at home.  Reviewed THR, pulse, RPE, sign and symptoms, pulse oximetery and when to call 911 or MD.  Also discussed weather considerations and indoor options.  Pt voiced understanding. Anne Ross is doing well in rehab. She has been able to increase her speed on the treadmill from 2 to 2. and no incline. She was also able to increase from level 1 to 2 on the recumbent bike. We will continue to monitor her progress in the program.   Expected Outcomes Short: Use RPE daily to regulate intensity. Long: Follow program prescription in THR. Short: Continue to follow exercise prescription. Long: Continue exercise to improve strength and stamina. Short: Continue to progressively increase workloads. Long: Continue exercise to improve strength and stamina. Short: Add 2 additional days of exercise at home. Long: Continue to exercise independently. Short: Continue to increase treadmill workload. Long: Continune exercise to improve strength and stamina.    Row Name 10/23/24 1356 11/05/24 0949 11/07/24 1615         Exercise Goal Re-Evaluation   Exercise Goals Review Increase Physical Activity;Increase Strength and Stamina;Understanding of Exercise Prescription Increase Physical Activity;Increase Strength and Stamina;Improve claudication pain tolerance and improve walking ability Increase Physical Activity;Increase Strength  and Stamina;Improve claudication pain tolerance and improve walking ability     Comments Anne Ross is doing well in rehab. She is due for her post soon and hopes to improve. She was able to increase her incline on the treadmill to 1.5% while maintaining a speed of 2.2 mph. She also increased to level 3 on the T4 and T5 nusteps. She added the T6 at level 2 and the biostep at level 3. We will continue to monitor her progress in the program. Anne Ross is exercising at home 2 days a week outside of rehab. She walks on her TM and uses her hand weights. She reports no concerns with her home exercise, and plans to continue with this onces she graduates from cardiac rehab. Anne Ross is doing well in the program. She was able to maintain her workload on the treadmill at a speed of 2. and 1% incline. She was also able to [facility] level 3 on the T4 nustep. We will continue to monitor her progress in the program.     Expected Outcomes Short: Improve on post . Long: Continue to increase overall METs and stamina. Short: graduate from cardiac rehab. Long: maintain independent exercise routine. Short: improve on post . Long: continue exercise to improve strength and stamina.        Discharge Exercise Prescription (Final Exercise Prescription Changes):  Exercise Prescription Changes - 11/07/24 1600  Response to Exercise   Blood Pressure (Admit) 128/56    Blood Pressure (Exercise) 130/58    Blood Pressure (Exit) 102/60    Heart Rate (Admit) 83 bpm    Heart Rate (Exercise) 125 bpm    Heart Rate (Exit) 84 bpm    Rating of Perceived Exertion (Exercise) 12    Symptoms none    Duration Continue with 30 min of aerobic exercise without signs/symptoms of physical distress.    Intensity THRR unchanged      Progression   Progression Continue to progress workloads to maintain intensity without signs/symptoms of physical distress.    Average METs 2.7      Resistance Training   Training Prescription Yes     Weight 2 lb    Reps 10-15      Interval Training   Interval Training No      Treadmill   MPH 2.2    Grade 1    Minutes 15    METs 2.99      NuStep   Level 3    Minutes 15    METs 2.9      Home Exercise Plan   Plans to continue exercise at Home (comment)   Treadmill at home and 3 lb handweights   Frequency Add 2 additional days to program exercise sessions.    Initial Home Exercises Provided 10/03/24      Oxygen   Maintain Oxygen Saturation 88% or higher          Nutrition:  Target Goals: Understanding of nutrition guidelines, daily intake of sodium 1500mg , cholesterol 200mg , calories 30% from fat and 7% or less from saturated fats, daily to have 5 or more servings of fruits and vegetables.  Education: Nutrition 1 -Group instruction provided by verbal, written material, interactive activities, discussions, models, and posters to present general guidelines for heart healthy nutrition including macronutrients, label reading, and promoting whole foods over processed counterparts. Education serves as pensions consultant of discussion of heart healthy eating for all. Written material provided at class time.    Education: Nutrition 2 -Group instruction provided by verbal, written material, interactive activities, discussions, models, and posters to present general guidelines for heart healthy nutrition including sodium, cholesterol, and saturated fat. Providing guidance of habit forming to improve blood pressure, cholesterol, and body weight. Written material provided at class time. Flowsheet Row Cardiac Rehab from 11/07/2024 in [facility] Cardiac and Pulmonary Rehab  Date 09/26/24  Educator jg  Instruction Review Code 1- Verbalizes Understanding      Biometrics:  Pre Biometrics - 08/30/24 0921       Pre Biometrics   Height 5' 4.5 (1.638 m)    Weight 150 lb 9.6 oz (68.3 kg)    Waist Circumference 34 inches    Hip Circumference 39 inches    Waist to Hip Ratio 0.87 %    BMI  (Calculated) 25.46    Single Leg Stand 10.15 seconds          Post Biometrics - 11/07/24 0940        Post  Biometrics   Height 5' 4.5 (1.638 m)    Weight 149 lb 12.8 oz (67.9 kg)    Waist Circumference 35.5 inches    Hip Circumference 40 inches    Waist to Hip Ratio 0.89 %    BMI (Calculated) 25.33    Single Leg Stand 23.1 seconds          Nutrition Therapy Plan and Nutrition Goals:  Nutrition Therapy & Goals -  08/30/24 9081       Nutrition Therapy   RD appointment deferred Yes      Intervention Plan   Intervention Prescribe, educate and counsel regarding individualized specific dietary modifications aiming towards targeted core components such as weight, hypertension, lipid management, diabetes, heart failure and other comorbidities.    Expected Outcomes Short Term Goal: Understand basic principles of dietary content, such as calories, fat, sodium, cholesterol and nutrients.;Short Term Goal: A plan has been developed with personal nutrition goals set during dietitian appointment.;Long Term Goal: Adherence to prescribed nutrition plan.          Nutrition Assessments:  MEDIFICTS Score Key: >=70 Need to make dietary changes  40-70 Heart Healthy Diet <= 40 Therapeutic Level Cholesterol Diet  Flowsheet Row Cardiac Rehab from 11/09/2024 in [facility] Cardiac and Pulmonary Rehab  Picture Your Plate Total Score on Admission 68  Picture Your Plate Total Score on Discharge 70   Picture Your Plate Scores: <59 Unhealthy dietary pattern with much room for improvement. 41-50 Dietary pattern unlikely to meet recommendations for good health and room for improvement. 51-60 More healthful dietary pattern, with some room for improvement.  >60 Healthy dietary pattern, although there may be some specific behaviors that could be improved.    Nutrition Goals Re-Evaluation:  Nutrition Goals Re-Evaluation     Row Name 10/01/24 0941 11/05/24 1000           Goals   Comment RD  appointment deferred. RD appointment deferred.         Nutrition Goals Discharge (Final Nutrition Goals Re-Evaluation):  Nutrition Goals Re-Evaluation - 11/05/24 1000       Goals   Comment RD appointment deferred.          Psychosocial: Target Goals: Acknowledge presence or absence of significant depression and/or stress, maximize coping skills, provide positive support system. Participant is able to verbalize types and ability to use techniques and skills needed for reducing stress and depression.   Education: Stress, Anxiety, and Depression - Group verbal and visual presentation to define topics covered.  Reviews how body is impacted by stress, anxiety, and depression.  Also discusses healthy ways to reduce stress and to treat/manage anxiety and depression. Written material provided at class time. Flowsheet Row Cardiac Rehab from 11/07/2024 in [facility] [facility]  Date 10/31/24  Educator kb  Instruction Review Code 1- [employer] Understanding    Education: Sleep Hygiene -Provides group verbal and written instruction about how sleep can affect your health.  Define sleep hygiene, discuss sleep cycles and impact of sleep habits. Review good sleep hygiene tips.   Initial Review & Psychosocial Screening:  Initial Psych Review & Screening - 08/23/24 1545       Initial Review   Current issues with None Identified      Family Dynamics   Good Support System? Yes   husband and daughter     Barriers   Psychosocial barriers to participate in program There are no identifiable barriers or psychosocial needs.;The patient should benefit from training in stress management and relaxation.      Screening Interventions   Interventions Encouraged to exercise;To provide support and resources with identified psychosocial needs    Expected Outcomes Short Term goal: Utilizing psychosocial counselor, staff and physician to assist with identification of specific Stressors or current  issues interfering with healing process. Setting desired goal for each stressor or current issue identified.;Long Term Goal: Stressors or current issues are controlled or eliminated.;Short Term goal: Identification and  review with participant of any Quality of Life or Depression concerns found by scoring the questionnaire.;Long Term goal: The participant improves quality of Life and PHQ9 Scores as seen by post scores and/or verbalization of changes          Quality of Life Scores:   Quality of Life - 11/09/24 0923       Quality of Life Scores   Health/Function Pre 24.5 %    Health/Function Post 28 %    Health/Function % Change 14.29 %    Socioeconomic Pre 26.25 %    Socioeconomic Post 30 %    Socioeconomic % Change  14.29 %    Psych/Spiritual Pre 30 %    Psych/Spiritual Post 30 %    Psych/Spiritual % Change 0 %    Family Pre 28.8 %    Family Post 30 %    Family % Change 4.17 %    GLOBAL Pre 26.61 %    GLOBAL Post 29.12 %    GLOBAL % Change 9.43 %         Scores of 19 and below usually indicate a poorer quality of life in these areas.  A difference of  2-3 points is a clinically meaningful difference.  A difference of 2-3 points in the total score of the Quality of Life Index has been associated with significant improvement in overall quality of life, self-image, physical symptoms, and general health in studies assessing change in quality of life.  PHQ-9: Review Flowsheet  More data may exist      10/01/2024 08/30/2024 03/11/2023 02/04/2023 01/10/2023  Depression screen PHQ 2/9  Decreased Interest 0 1 0 0 0  Down, Depressed, Hopeless 0 1 0 0 0  PHQ - 2 Score 0 2 0 0 0  Altered sleeping 0 0 0 0 0  Tired, decreased energy 2 3 1 1 3   Change in appetite 0 1 0 0 2  Feeling bad or failure about yourself  0 0 0 0 0  Trouble concentrating 0 0 0 0 0  Moving slowly or fidgety/restless 1 1 0 0 2  Suicidal thoughts 0 0 0 0 0  PHQ-9 Score 3  7  1  1  7    Difficult doing work/chores Not  difficult at all Somewhat difficult Not difficult at all Not difficult at all Somewhat difficult    Details       Data saved with a previous flowsheet row definition        Interpretation of Total Score  Total Score Depression Severity:  1-4 = Minimal depression, 5-9 = Mild depression, 10-14 = Moderate depression, 15-19 = Moderately severe depression, 20-27 = Severe depression   Psychosocial Evaluation and Intervention:  Psychosocial Evaluation - 08/23/24 1548       Psychosocial Evaluation & Interventions   Interventions Encouraged to exercise with the program and follow exercise prescription    Comments Anne Ross attended the program in 2024. She had a cardiac cath 8/18 due to angina and had a follow up cath in 9/17 due to continued angina on exertion. No new stents were placed, however she did start Imdur and stop carvedilol which seems to have decreased her pain. Anne Ross is retired and lives at home with her husband. Her daughter is also staying with her during the day as she reports continued general weakness and difficulty completing ADLs.    Expected Outcomes Short: Attend cardiac and pulmonary rehab for education and exercise. Long: Develop and maintain positive self care habits.  Continue Psychosocial Services  Follow up required by staff          Psychosocial Re-Evaluation:  Psychosocial Re-Evaluation     Row Name 10/01/24 0945 11/05/24 1000           Psychosocial Re-Evaluation   Current issues with None Identified None Identified      Comments Reviewed patient health questionnaire (PHQ-9) with patient for follow up. Previously, patients score indicated signs/symptoms of depression.  Reviewed to see if patient is improving symptom wise while in program.  Score improved and patient states that it is because she has more energy and has more stamina. Anne Ross reports no concerns with stress, or mental health. She states she has not been sleeping well this last week becasue  of a sinus infection but she is on antibiotics and starting to feel better. Aside from this past week she states that she sleeps well.      Expected Outcomes Short: Continue to attend [facility] regularly for regular exercise and social engagement. Long: Continue to improve symptoms and manage a positive mental state. Short: continue to attend rehab for mental health benefits from exercise. Long: maintain good mental health routine.      Interventions Encouraged to attend Cardiac Rehabilitation for the exercise Encouraged to attend Cardiac Rehabilitation for the exercise      Continue Psychosocial Services  Follow up required by staff No Follow up required         Psychosocial Discharge (Final Psychosocial Re-Evaluation):  Psychosocial Re-Evaluation - 11/05/24 1000       Psychosocial Re-Evaluation   Current issues with None Identified    Comments Anne Ross reports no concerns with stress, or mental health. She states she has not been sleeping well this last week becasue of a sinus infection but she is on antibiotics and starting to feel better. Aside from this past week she states that she sleeps well.    Expected Outcomes Short: continue to attend rehab for mental health benefits from exercise. Long: maintain good mental health routine.    Interventions Encouraged to attend Cardiac Rehabilitation for the exercise    Continue Psychosocial Services  No Follow up required          Vocational Rehabilitation: Provide vocational rehab assistance to qualifying candidates.   Vocational Rehab Evaluation & Intervention:  Vocational Rehab - 08/23/24 1544       Initial Vocational Rehab Evaluation & Intervention   Assessment shows need for Vocational Rehabilitation No      Vocational Rehab Re-Evaulation   Comments retired          Education: Education Goals: Education classes will be provided on a variety of topics geared toward better understanding of heart health and risk factor modification.  Participant will state understanding/return demonstration of topics presented as noted by education test scores.  Learning Barriers/Preferences:  Learning Barriers/Preferences - 08/23/24 1544       Learning Barriers/Preferences   Learning Barriers None    Learning Preferences None          General Cardiac Education Topics:  AED/CPR: - Group verbal and written instruction with the use of models to demonstrate the basic use of the AED with the basic ABC's of resuscitation.   Test and Procedures: - Group verbal and visual presentation and models provide information about basic cardiac anatomy and function. Reviews the testing methods done to diagnose heart disease and the outcomes of the test results. Describes the treatment choices: Medical Management, Angioplasty, or Coronary Bypass Surgery for  treating various heart conditions including Myocardial Infarction, Angina, Valve Disease, and Cardiac Arrhythmias. Written material provided at class time. Flowsheet Row Cardiac Rehab from 11/07/2024 in [facility] Cardiac and Pulmonary Rehab  Date 10/03/24  Educator lc  Instruction Review Code 1- Verbalizes Understanding    Medication Safety: - Group verbal and visual instruction to review commonly prescribed medications for heart and lung disease. Reviews the medication, class of the drug, and side effects. Includes the steps to properly store meds and maintain the prescription regimen. Written material provided at class time. Flowsheet Row Cardiac Rehab from 11/07/2024 in [facility] Cardiac and Pulmonary Rehab  Date 10/10/24  Educator KB  Instruction Review Code 1- Verbalizes Understanding    Intimacy: - Group verbal instruction through game format to discuss how heart and lung disease can affect sexual intimacy. Written material provided at class time. Flowsheet Row Cardiac Rehab from 11/07/2024 in [facility] Cardiac and Pulmonary Rehab  Date 09/12/24  Educator nt  Instruction Review Code 1-  [employer] Teacher Understanding    Know Your Numbers and Heart Failure: - Group verbal and visual instruction to discuss disease risk factors for cardiac and pulmonary disease and treatment options.  Reviews associated critical values for Overweight/Obesity, Hypertension, Cholesterol, and Diabetes.  Discusses basics of heart failure: signs/symptoms and treatments.  Introduces Heart Failure Zone chart for action plan for heart failure. Written material provided at class time. Flowsheet Row Cardiac Rehab from 11/07/2024 in [facility] Cardiac and Pulmonary Rehab  Date 10/24/24  Educator kb  Instruction Review Code 1- Verbalizes Understanding    Infection Prevention: - Provides verbal and written material to individual with discussion of infection control including proper hand washing and proper equipment cleaning during exercise session. Flowsheet Row Cardiac Rehab from 11/07/2024 in [facility] Cardiac and Pulmonary Rehab  Date 08/30/24  Educator NT  Instruction Review Code 1- Verbalizes Understanding    Falls Prevention: - Provides verbal and written material to individual with discussion of falls prevention and safety. Flowsheet Row Cardiac Rehab from 11/07/2024 in [facility] Cardiac and Pulmonary Rehab  Date 08/30/24  Educator NT  Instruction Review Code 1- Verbalizes Understanding    Other: -Provides group and verbal instruction on various topics (see comments) Flowsheet Row Cardiac Rehab from 03/09/2023 in [facility] Cardiac and Pulmonary Rehab  Date 01/12/23  Educator SB  Instruction Review Code 1- Verbalizes Understanding    Knowledge Questionnaire Score:  Knowledge Questionnaire Score - 11/09/24 9076       Knowledge Questionnaire Score   Pre Score 24/26    Post Score 24/26          Core Components/Risk Factors/Patient Goals at Admission:  Personal Goals and Risk Factors at Admission - 08/23/24 1543       Core Components/Risk Factors/Patient Goals on Admission    Weight Management Weight  Maintenance;Yes    Intervention Weight Management: Develop a combined nutrition and exercise program designed to reach desired caloric intake, while maintaining appropriate intake of nutrient and fiber, sodium and fats, and appropriate energy expenditure required for the weight goal.;Weight Management: Provide education and appropriate resources to help participant work on and attain dietary goals.    Expected Outcomes Short Term: Continue to assess and modify interventions until short term weight is achieved;Long Term: Adherence to nutrition and physical activity/exercise program aimed toward attainment of established weight goal;Understanding recommendations for meals to include 15-35% energy as protein, 25-35% energy from fat, 35-60% energy from carbohydrates, less than 200mg  of dietary cholesterol, 20-35 gm of total fiber daily;Understanding of distribution of  calorie intake throughout the day with the consumption of 4-5 meals/snacks;Weight Maintenance: Understanding of the daily nutrition guidelines, which includes 25-35% calories from fat, 7% or less cal from saturated fats, less than 200mg  cholesterol, less than 1.5gm of sodium, & 5 or more servings of fruits and vegetables daily    Hypertension Yes    Intervention Provide education on lifestyle modifcations including regular physical activity/exercise, weight management, moderate sodium restriction and increased consumption of fresh fruit, vegetables, and low fat dairy, alcohol moderation, and smoking cessation.;Monitor prescription use compliance.    Expected Outcomes Short Term: Continued assessment and intervention until BP is < 140/37mm HG in hypertensive participants. < 130/67mm HG in hypertensive participants with diabetes, heart failure or chronic kidney disease.;Long Term: Maintenance of blood pressure at goal levels.    Lipids Yes    Intervention Provide education and support for participant on nutrition & aerobic/resistive exercise along  with prescribed medications to achieve LDL 70mg , HDL >40mg .    Expected Outcomes Short Term: Participant states understanding of desired cholesterol values and is compliant with medications prescribed. Participant is following exercise prescription and nutrition guidelines.;Long Term: Cholesterol controlled with medications as prescribed, with individualized exercise RX and with personalized nutrition plan. Value goals: LDL < 70mg , HDL > 40 mg.          Education:Diabetes - Individual verbal and written instruction to review signs/symptoms of diabetes, desired ranges of glucose level fasting, after meals and with exercise. Acknowledge that pre and post exercise glucose checks will be done for 3 sessions at entry of program.   Core Components/Risk Factors/Patient Goals Review:   Goals and Risk Factor Review     Row Name 10/01/24 0949 11/05/24 0955           Core Components/Risk Factors/Patient Goals Review   Personal Goals Review Weight Management/Obesity;Diabetes Weight Management/Obesity;Lipids;Hypertension;Diabetes      Review Anne Ross states her A1C is 6.9 and she watching her diet. She is maintaing her weight and is 151 pounds. Her blood pressure has been stable in the 120s over 60s. she is checking her blood pressure at home routinely. Anne Ross has recently gotten her lab work done to watch her A1C but has not gotten her results back yet. She does report that she is watching her sugars and carbs to help keep this down. She is currently not on any medicaitons for DM. Her weight has been steady and she reports that she takes all her BP and cholesterol medications. She follow up with her doctor to get lab work to manage cholesterol. She states her blood pressue is much better now then it was a year ago and she feels like her medicaitons are effectively managing her BP.      Expected Outcomes Short: continue blood pressure readings and maintaining weight. Long: continue [facility] to improve overall  endurance. Short: check lab results to see if A1C has come down. Long: continue to monitor and control cardiac risk factors.         Core Components/Risk Factors/Patient Goals at Discharge (Final Review):   Goals and Risk Factor Review - 11/05/24 0955       Core Components/Risk Factors/Patient Goals Review   Personal Goals Review Weight Management/Obesity;Lipids;Hypertension;Diabetes    Review Anne Ross has recently gotten her lab work done to watch her A1C but has not gotten her results back yet. She does report that she is watching her sugars and carbs to help keep this down. She is currently not on any medicaitons for DM. Her  weight has been steady and she reports that she takes all her BP and cholesterol medications. She follow up with her doctor to get lab work to manage cholesterol. She states her blood pressue is much better now then it was a year ago and she feels like her medicaitons are effectively managing her BP.    Expected Outcomes Short: check lab results to see if A1C has come down. Long: continue to monitor and control cardiac risk factors.          ITP Comments:  ITP Comments     Row Name 08/23/24 1556 08/30/24 0915 09/03/24 0923 09/19/24 0828 10/17/24 0939   ITP Comments Initial phone call completed. Diagnosis can be found in [facility] 8/18. EP Orientation scheduled for Thursday 08/30/24 at 0800. SABRA Completed and gym orientation for cardiac rehab. Initial ITP created and sent for review to Dr. Oneil Pinal, Medical Director. First full day of exercise!  Patient was oriented to gym and equipment including functions, settings, policies, and procedures.  Patient's individual exercise prescription and treatment plan were reviewed.  All starting workloads were established based on the results of the 6 minute walk test done at initial orientation visit.  The plan for exercise progression was also introduced and progression will be customized based on patient's performance and goals. 30 Day  review completed. Medical Director ITP review done, changes made as directed, and signed approval by Medical Director. New to program. 30 Day review completed. Medical Director ITP review done, changes made as directed, and signed approval by Medical Director.    Row Name 11/12/24 0945           ITP Comments Anne Ross graduated today from  rehab with 36 sessions completed.  Details of the patient's exercise prescription and what She needs to do in order to continue the prescription and progress were discussed with patient.  Patient was given a copy of prescription and goals.  Patient verbalized understanding. Anne Ross plans to continue to exercise by walking at home.          Comments: discharge ITP    [1]  Current Outpatient Medications:    amLODipine (NORVASC) 10 MG tablet, Take 10 mg by mouth daily., Disp: , Rfl:    ascorbic acid (VITAMIN C) 1000 MG tablet, Take 1,000 mg by mouth daily., Disp: , Rfl:    Aspirin  81 MG CAPS, Take 81 mg by mouth daily., Disp: , Rfl:    atorvastatin (LIPITOR) 80 MG tablet, Take 1 tablet by mouth daily., Disp: , Rfl:    azithromycin  (ZITHROMAX ) 250 MG tablet, Take 1 tablet (250 mg total) by mouth daily. Take first 2 tablets together, then 1 every day until finished. (Patient not taking: Reported on 08/23/2024), Disp: 6 tablet, Rfl: 0   benzonatate  (TESSALON ) 200 MG capsule, Take 1 capsule (200 mg total) by mouth 3 (three) times daily as needed. (Patient not taking: Reported on 08/23/2024), Disp: 20 capsule, Rfl: 0   Calcipotriene 0.005 % solution, 1 Application as needed., Disp: , Rfl:    Calcium Carbonate-Vitamin D  500-125 MG-UNIT TABS, Take 1 tablet by mouth daily at 10 pm., Disp: , Rfl:    Calcium Polycarbophil (FIBER) 625 MG TABS, Take 1 tablet by mouth daily., Disp: , Rfl:    carvedilol (COREG) 6.25 MG tablet, Take 6.25 mg by mouth 2 (two) times daily. (Patient not taking: Reported on 08/23/2024), Disp: , Rfl:    cetirizine (ZYRTEC) 10 MG tablet, Take 10 mg  by mouth daily., Disp: ,  Rfl:    chlorhexidine (PERIDEX) 0.12 % solution, Use as directed 15 mLs in the mouth or throat as needed. (Patient not taking: Reported on 08/23/2024), Disp: , Rfl:    Cholecalciferol (VITAMIN D ) 2000 units tablet, Take 2,000 Units by mouth daily., Disp: , Rfl:    clopidogrel (PLAVIX) 75 MG tablet, Take 75 mg by mouth daily., Disp: , Rfl:    fluticasone (FLONASE) 50 MCG/ACT nasal spray, Place 1 spray into both nostrils daily., Disp: , Rfl:    folic acid (FOLVITE) 800 MCG tablet, Take 400 mcg by mouth daily., Disp: , Rfl:    isosorbide mononitrate (IMDUR) 30 MG 24 hr tablet, Take 30 mg by mouth daily., Disp: , Rfl:    metoprolol  succinate (TOPROL -XL) 50 MG 24 hr tablet, TAKE 1 TABLET(50 MG) BY MOUTH EVERY DAY, Disp: , Rfl:    nitroGLYCERIN  (NITROSTAT ) 0.4 MG SL tablet, Place 1 tablet under the tongue every 5 (five) minutes as needed for chest pain (up to 3 tab)., Disp: , Rfl:    pantoprazole (PROTONIX) 40 MG tablet, Take 40 mg by mouth daily., Disp: , Rfl:    pimecrolimus (ELIDEL) 1 % cream, Apply 1 Application topically as needed., Disp: , Rfl:    pyridoxine (B-6) 100 MG tablet, Take 100 mg by mouth daily., Disp: , Rfl:    ramipril (ALTACE) 10 MG capsule, Take 10 mg by mouth daily., Disp: , Rfl:    trimethoprim  (TRIMPEX ) 100 MG tablet, Take 1 tablet (100 mg total) by mouth daily., Disp: 90 tablet, Rfl: 3   TURMERIC PO, Take 1 capsule by mouth as needed., Disp: , Rfl:    vitamin B-12 (CYANOCOBALAMIN) 250 MCG tablet, Take 1,000 mcg by mouth daily., Disp: , Rfl:  [2]  Social History Tobacco Use  Smoking Status Never  Smokeless Tobacco Never

## 2024-11-12 NOTE — Progress Notes (Signed)
 Discharge Summary Patient: Anne Ross DOB: Feb 09, 1941  [name] graduated today from  rehab with 36 sessions completed.  Details of the patient's exercise prescription and what She needs to do in order to continue the prescription and progress were discussed with patient.  Patient was given a copy of prescription and goals.  Patient verbalized understanding. [name] plans to continue to exercise by walking at home.   6 Minute Walk     Row Name 08/30/24 0919 11/07/24 0937       6 Minute Walk   Phase Initial Discharge    Distance 950 feet 1225 feet    Distance % Change -- 28.95 %    Distance Feet Change -- 275 ft    Walk Time 6 minutes 6 minutes    # of Rest Breaks 0 0    MPH 1.8 2.32    METS 1.98 2.53    RPE 13 14    Perceived Dyspnea  1 2    VO2 Peak 6.93 8.84    Symptoms No No    Resting HR 88 bpm 85 bpm    Resting BP 132/52 128/58    Resting Oxygen Saturation  97 % 99 %    Exercise Oxygen Saturation  during 6 min walk 97 % 97 %    Max Ex. HR 107 bpm 108 bpm    Max Ex. BP 158/54 164/60    2 Minute Post BP 126/50 130/60

## 2024-11-12 NOTE — Progress Notes (Signed)
 Daily Session Note  Patient Details  Name: Anne Ross MRN: [id number] Date of Birth: 1941-07-08 Referring Provider:   Flowsheet Row Cardiac Rehab from 08/30/2024 in [facility] Cardiac and Pulmonary Rehab  Referring Provider Dr. Margery Ruth, MD    Encounter Date: 11/12/2024  Check In:  Session Check In - 11/12/24 0940       Check-In   Supervising physician immediately available to respond to emergencies See telemetry face sheet for immediately available ER MD    Location [facility]-Cardiac & Pulmonary Rehab    Staff Present Burnard Davenport RN,BSN,MPA;Maxon Burnell BS, Exercise Physiologist;Joseph [facility] BS, ACSM CEP, Exercise Physiologist    Virtual Visit No    Medication changes reported     No    Fall or balance concerns reported    No    Tobacco Cessation No Change    Warm-up and Cool-down Performed on first and last piece of equipment    Resistance Training Performed Yes    VAD Patient? No    PAD/SET Patient? No      Pain Assessment   Currently in Pain? No/denies             Tobacco Use History[1]  Goals Met:  Independence with exercise equipment Exercise tolerated well No report of concerns or symptoms today Strength training completed today  Goals Unmet:  Not Applicable  Comments:  Anne Ross graduated today from  rehab with 36 sessions completed.  Details of the patient's exercise prescription and what She needs to do in order to continue the prescription and progress were discussed with patient.  Patient was given a copy of prescription and goals.  Patient verbalized understanding. Anne Ross plans to continue to exercise by walking at home.     Dr. Oneil Pinal is Medical Director for [facility] Cardiac Rehabilitation.  Dr. Fuad Aleskerov is Medical Director for [facility] Pulmonary Rehabilitation.     [1]  Social History Tobacco Use  Smoking Status Never  Smokeless Tobacco Never

## 2024-11-14 ENCOUNTER — Encounter

## 2024-11-16 ENCOUNTER — Encounter

## 2024-11-19 ENCOUNTER — Encounter

## 2024-11-21 ENCOUNTER — Encounter

## 2024-11-26 ENCOUNTER — Encounter

## 2024-11-28 ENCOUNTER — Encounter

## 2024-11-30 ENCOUNTER — Encounter

## 2024-12-07 ENCOUNTER — Ambulatory Visit (INDEPENDENT_AMBULATORY_CARE_PROVIDER_SITE_OTHER): Payer: Self-pay | Admitting: Urology

## 2024-12-07 ENCOUNTER — Encounter: Payer: Self-pay | Admitting: Urology

## 2024-12-07 VITALS — BP 144/53 | HR 88 | Ht 64.0 in | Wt 145.2 lb

## 2024-12-07 DIAGNOSIS — N390 Urinary tract infection, site not specified: Secondary | ICD-10-CM

## 2024-12-07 LAB — URINALYSIS, COMPLETE
Bilirubin, UA: NEGATIVE
Glucose, UA: NEGATIVE
Ketones, UA: NEGATIVE
Leukocytes,UA: NEGATIVE
Nitrite, UA: NEGATIVE
Protein,UA: NEGATIVE
RBC, UA: NEGATIVE
Specific Gravity, UA: <1.005 — ABNORMAL LOW (ref 1.005–1.030)
Urobilinogen, Ur: 0.2 mg/dL (ref 0.2–1.0)
pH, UA: 5.5 (ref 5.0–7.5)

## 2024-12-07 LAB — MICROSCOPIC EXAMINATION: RBC, Urine: NONE SEEN /HPF (ref 0–2)

## 2024-12-07 MED ORDER — TRIMETHOPRIM 100 MG PO TABS: 100.0000 mg | ORAL_TABLET | Freq: Every day | ORAL | 3 refills | Status: AC

## 2024-12-07 NOTE — Progress Notes (Signed)
 "  12/07/2024 10:52 AM   Anne Ross September 17, 1941 [id number]  Referring provider: Jeffie Cheryl BRAVO, MD [address] [place],  [place] [address]  Chief Complaint  Patient presents with   Recurrent UTI   Urologic history 1. Recurrent UTI Low-dose trimethoprim  suppression   HPI: Anne Ross is a 84 y.o. female presents for annual follow-up.  Since last visit was treated for asymptomatic UTI by Dr. Jeffie May 2025.  Urinalysis with pyuria and urine culture grew Citrobacter.  Otherwise asymptomatic.  Symptom resolution after antibiotic treatment Required balloon angioplasty 07/16/2024 Asymptomatic today   PMH: Past Medical History:  Diagnosis Date   Basal cell carcinoma    chest    Basal cell carcinoma 02/09/2024   left mid dorsal forearm - tx with ED&C   Cancer (HCC)    uterine   History of COVID-19    Hypertension     Surgical History: Past Surgical History:  Procedure Laterality Date   ABDOMINAL HYSTERECTOMY     BREAST BIOPSY Left    benign 2 times   BREAST BIOPSY Right    neg   BREAST CYST ASPIRATION Left    neg   LEFT HEART CATH AND CORONARY ANGIOGRAPHY N/A 12/17/2022   Procedure: LEFT HEART CATH AND CORONARY ANGIOGRAPHYwith coronary intervention;  Surgeon: Fernand Denyse LABOR, MD;  Location: [facility];  Service: Cardiovascular;  Laterality: N/A;    Home Medications:  Allergies as of 12/07/2024       Reactions   Cortisone Hives   Neuromuscular Blocking Agents Anaphylaxis   steroidal   Other Hives   Prednisone Shortness Of Breath   Rosuvastatin    Other reaction(s): Muscle Pain        Medication List        Accurate as of December 07, 2024 10:52 AM. If you have any questions, ask your nurse or doctor.          STOP taking these medications    azithromycin  250 MG tablet Commonly known as: ZITHROMAX  Stopped by: Glendia Barba, MD   benzonatate  200 MG capsule Commonly known as: TESSALON  Stopped by: Glendia Barba,  MD   Calcipotriene 0.005 % solution Stopped by: Glendia Barba, MD   metoprolol  succinate 50 MG 24 hr tablet Commonly known as: TOPROL -XL Stopped by: Glendia Barba, MD   pimecrolimus 1 % cream Commonly known as: ELIDEL Stopped by: Glendia Barba, MD       TAKE these medications    amLODipine 10 MG tablet Commonly known as: NORVASC Take 10 mg by mouth daily.   ascorbic acid 1000 MG tablet Commonly known as: VITAMIN C Take 1,000 mg by mouth daily.   Aspirin  81 MG Caps Take 81 mg by mouth daily.   atorvastatin 80 MG tablet Commonly known as: LIPITOR Take 1 tablet by mouth daily.   Calcium Carbonate-Vitamin D  500-125 MG-UNIT Tabs Take 1 tablet by mouth daily at 10 pm.   carvedilol 6.25 MG tablet Commonly known as: COREG Take 6.25 mg by mouth 2 (two) times daily.   cetirizine 10 MG tablet Commonly known as: ZYRTEC Take 10 mg by mouth daily.   chlorhexidine 0.12 % solution Commonly known as: PERIDEX Use as directed 15 mLs in the mouth or throat as needed.   clopidogrel 75 MG tablet Commonly known as: PLAVIX Take 75 mg by mouth daily.   cyanocobalamin 250 MCG tablet Commonly known as: VITAMIN B12 Take 1,000 mcg by mouth daily.   Fiber 625 MG Tabs Take  1 tablet by mouth daily.   fluticasone 50 MCG/ACT nasal spray Commonly known as: FLONASE Place 1 spray into both nostrils daily.   folic acid 800 MCG tablet Commonly known as: FOLVITE Take 400 mcg by mouth daily.   isosorbide mononitrate 30 MG 24 hr tablet Commonly known as: IMDUR Take 30 mg by mouth daily.   nitroGLYCERIN  0.4 MG SL tablet Commonly known as: NITROSTAT  Place 1 tablet under the tongue every 5 (five) minutes as needed for chest pain (up to 3 tab).   pantoprazole 40 MG tablet Commonly known as: PROTONIX Take 40 mg by mouth daily.   pyridoxine 100 MG tablet Commonly known as: B-6 Take 100 mg by mouth daily.   ramipril 10 MG capsule Commonly known as: ALTACE Take 10 mg by mouth  daily.   trimethoprim  100 MG tablet Commonly known as: TRIMPEX  Take 1 tablet (100 mg total) by mouth daily.   TURMERIC PO Take 1 capsule by mouth as needed.   Vitamin D  50 MCG (2000 UT) tablet Take 2,000 Units by mouth daily.        Allergies: Allergies[1]  Family History: Family History  Problem Relation Age of Onset   Prostate cancer Maternal Grandfather    Breast cancer Neg Hx     Social History:  reports that she has never smoked. She has never used smokeless tobacco. She reports that she does not drink alcohol and does not use drugs.   Physical Exam: BP (!) 144/53   Pulse 88   Ht 5' 4 (1.626 m)   Wt 145 lb 3.2 oz (65.9 kg)   BMI 24.92 kg/m   Constitutional:  Alert, No acute distress. HEENT: [place] AT Respiratory: Normal respiratory effort, no increased work of breathing. Psychiatric: Normal mood and affect.  Laboratory Data:  Urinalysis Dipstick/microscopy negative    Assessment & Plan:    1. Recurrent UTI (Primary) Doing well on low-dose antibiotic suppression; only 1 UTI in the last 4+ years Trimethoprim  refilled Continue annual follow-up; prn visit for recurrent UTI symptoms   Glendia JAYSON Barba, MD  [facility] [address], [address] [place], [place] [address] [phone]     [1]  Allergies Allergen Reactions   Cortisone Hives   Neuromuscular Blocking Agents Anaphylaxis    steroidal   Other Hives   Prednisone Shortness Of Breath   Rosuvastatin     Other reaction(s): Muscle Pain   "

## 2025-02-07 ENCOUNTER — Ambulatory Visit: Admitting: Dermatology

## 2025-12-06 ENCOUNTER — Ambulatory Visit: Admitting: Urology
